# Patient Record
Sex: Female | Born: 1945 | Race: White | Hispanic: No | Smoking: Former smoker
Health system: Southern US, Community
[De-identification: ages and names within clinical notes are randomized; demographics above are authoritative.]

## PROBLEM LIST (undated history)

## (undated) DIAGNOSIS — I10 Essential (primary) hypertension: Secondary | ICD-10-CM

## (undated) DIAGNOSIS — U071 COVID-19: Secondary | ICD-10-CM

## (undated) DIAGNOSIS — Z86718 Personal history of other venous thrombosis and embolism: Secondary | ICD-10-CM

## (undated) DIAGNOSIS — I509 Heart failure, unspecified: Secondary | ICD-10-CM

## (undated) DIAGNOSIS — R011 Cardiac murmur, unspecified: Secondary | ICD-10-CM

## (undated) HISTORY — DX: Cardiac murmur, unspecified: R01.1

## (undated) HISTORY — PX: NO PAST SURGERIES: SHX2092

---

## 2001-02-27 ENCOUNTER — Ambulatory Visit (HOSPITAL_COMMUNITY): Admission: RE | Admit: 2001-02-27 | Discharge: 2001-02-27 | Payer: Self-pay | Admitting: *Deleted

## 2001-02-27 ENCOUNTER — Encounter: Payer: Self-pay | Admitting: *Deleted

## 2003-01-24 ENCOUNTER — Ambulatory Visit (HOSPITAL_COMMUNITY): Admission: RE | Admit: 2003-01-24 | Discharge: 2003-01-24 | Payer: Self-pay | Admitting: *Deleted

## 2004-07-09 ENCOUNTER — Ambulatory Visit (HOSPITAL_COMMUNITY): Admission: RE | Admit: 2004-07-09 | Discharge: 2004-07-09 | Payer: Self-pay | Admitting: Family Medicine

## 2005-08-13 ENCOUNTER — Ambulatory Visit (HOSPITAL_COMMUNITY): Admission: RE | Admit: 2005-08-13 | Discharge: 2005-08-13 | Payer: Self-pay | Admitting: Family Medicine

## 2006-08-19 ENCOUNTER — Ambulatory Visit (HOSPITAL_COMMUNITY): Admission: RE | Admit: 2006-08-19 | Discharge: 2006-08-19 | Payer: Self-pay | Admitting: *Deleted

## 2007-10-14 ENCOUNTER — Ambulatory Visit (HOSPITAL_COMMUNITY): Admission: RE | Admit: 2007-10-14 | Discharge: 2007-10-14 | Payer: Self-pay | Admitting: *Deleted

## 2008-12-06 ENCOUNTER — Ambulatory Visit (HOSPITAL_COMMUNITY): Admission: RE | Admit: 2008-12-06 | Discharge: 2008-12-06 | Payer: Self-pay | Admitting: Family Medicine

## 2008-12-28 ENCOUNTER — Emergency Department: Payer: Self-pay | Admitting: Emergency Medicine

## 2010-03-25 ENCOUNTER — Encounter: Payer: Self-pay | Admitting: Family Medicine

## 2010-11-06 ENCOUNTER — Other Ambulatory Visit (HOSPITAL_COMMUNITY): Payer: Self-pay | Admitting: Family Medicine

## 2010-11-06 DIAGNOSIS — Z139 Encounter for screening, unspecified: Secondary | ICD-10-CM

## 2010-11-12 ENCOUNTER — Ambulatory Visit (HOSPITAL_COMMUNITY)
Admission: RE | Admit: 2010-11-12 | Discharge: 2010-11-12 | Disposition: A | Discharge status: Home / Self Care | Payer: Medicare Other | Source: Ambulatory Visit | Attending: Family Medicine | Admitting: Family Medicine

## 2010-11-12 DIAGNOSIS — Z139 Encounter for screening, unspecified: Secondary | ICD-10-CM

## 2010-11-12 DIAGNOSIS — Z1231 Encounter for screening mammogram for malignant neoplasm of breast: Secondary | ICD-10-CM | POA: Insufficient documentation

## 2011-12-04 ENCOUNTER — Other Ambulatory Visit (HOSPITAL_COMMUNITY): Payer: Self-pay | Admitting: Family Medicine

## 2011-12-04 DIAGNOSIS — Z139 Encounter for screening, unspecified: Secondary | ICD-10-CM

## 2011-12-23 ENCOUNTER — Ambulatory Visit (HOSPITAL_COMMUNITY)
Admission: RE | Admit: 2011-12-23 | Discharge: 2011-12-23 | Disposition: A | Discharge status: Home / Self Care | Payer: Medicare Other | Source: Ambulatory Visit | Attending: Family Medicine | Admitting: Family Medicine

## 2011-12-23 DIAGNOSIS — Z1231 Encounter for screening mammogram for malignant neoplasm of breast: Secondary | ICD-10-CM | POA: Insufficient documentation

## 2011-12-23 DIAGNOSIS — Z139 Encounter for screening, unspecified: Secondary | ICD-10-CM

## 2012-11-27 ENCOUNTER — Other Ambulatory Visit (HOSPITAL_COMMUNITY): Payer: Self-pay | Admitting: Family Medicine

## 2012-11-27 DIAGNOSIS — Z139 Encounter for screening, unspecified: Secondary | ICD-10-CM

## 2012-12-28 ENCOUNTER — Ambulatory Visit (HOSPITAL_COMMUNITY)
Admission: RE | Admit: 2012-12-28 | Discharge: 2012-12-28 | Disposition: A | Discharge status: Home / Self Care | Payer: Medicare Other | Source: Ambulatory Visit | Attending: Family Medicine | Admitting: Family Medicine

## 2012-12-28 DIAGNOSIS — Z139 Encounter for screening, unspecified: Secondary | ICD-10-CM

## 2012-12-28 DIAGNOSIS — Z1231 Encounter for screening mammogram for malignant neoplasm of breast: Secondary | ICD-10-CM | POA: Insufficient documentation

## 2013-12-03 ENCOUNTER — Other Ambulatory Visit (HOSPITAL_COMMUNITY): Payer: Self-pay | Admitting: Family Medicine

## 2013-12-03 DIAGNOSIS — Z1231 Encounter for screening mammogram for malignant neoplasm of breast: Secondary | ICD-10-CM

## 2013-12-29 ENCOUNTER — Ambulatory Visit (HOSPITAL_COMMUNITY)
Admission: RE | Admit: 2013-12-29 | Discharge: 2013-12-29 | Disposition: A | Discharge status: Home / Self Care | Payer: Medicare Other | Source: Ambulatory Visit | Attending: Family Medicine | Admitting: Family Medicine

## 2013-12-29 DIAGNOSIS — Z1231 Encounter for screening mammogram for malignant neoplasm of breast: Secondary | ICD-10-CM | POA: Insufficient documentation

## 2014-12-08 ENCOUNTER — Other Ambulatory Visit (HOSPITAL_COMMUNITY): Payer: Self-pay | Admitting: Family Medicine

## 2014-12-08 DIAGNOSIS — Z1231 Encounter for screening mammogram for malignant neoplasm of breast: Secondary | ICD-10-CM

## 2015-01-02 ENCOUNTER — Ambulatory Visit (HOSPITAL_COMMUNITY)
Admission: RE | Admit: 2015-01-02 | Discharge: 2015-01-02 | Disposition: A | Discharge status: Home / Self Care | Payer: Medicare Other | Source: Ambulatory Visit | Attending: Family Medicine | Admitting: Family Medicine

## 2015-01-02 DIAGNOSIS — Z1231 Encounter for screening mammogram for malignant neoplasm of breast: Secondary | ICD-10-CM | POA: Diagnosis present

## 2015-09-25 ENCOUNTER — Encounter: Payer: Self-pay | Admitting: *Deleted

## 2015-09-26 ENCOUNTER — Ambulatory Visit: Payer: Medicare HMO | Admitting: Anesthesiology

## 2015-09-26 ENCOUNTER — Encounter: Payer: Self-pay | Admitting: *Deleted

## 2015-09-26 ENCOUNTER — Encounter
Admission: RE | Disposition: A | Discharge status: Home / Self Care | Payer: Self-pay | Source: Ambulatory Visit | Attending: Gastroenterology

## 2015-09-26 ENCOUNTER — Ambulatory Visit
Admission: RE | Admit: 2015-09-26 | Discharge: 2015-09-26 | Disposition: A | Discharge status: Home / Self Care | Payer: Medicare HMO | Source: Ambulatory Visit | Attending: Gastroenterology | Admitting: Gastroenterology

## 2015-09-26 DIAGNOSIS — D127 Benign neoplasm of rectosigmoid junction: Secondary | ICD-10-CM | POA: Insufficient documentation

## 2015-09-26 DIAGNOSIS — Z882 Allergy status to sulfonamides status: Secondary | ICD-10-CM | POA: Diagnosis not present

## 2015-09-26 DIAGNOSIS — Z79899 Other long term (current) drug therapy: Secondary | ICD-10-CM | POA: Diagnosis not present

## 2015-09-26 DIAGNOSIS — I10 Essential (primary) hypertension: Secondary | ICD-10-CM | POA: Diagnosis not present

## 2015-09-26 DIAGNOSIS — Z881 Allergy status to other antibiotic agents status: Secondary | ICD-10-CM | POA: Insufficient documentation

## 2015-09-26 DIAGNOSIS — Z7982 Long term (current) use of aspirin: Secondary | ICD-10-CM | POA: Diagnosis not present

## 2015-09-26 DIAGNOSIS — Z800 Family history of malignant neoplasm of digestive organs: Secondary | ICD-10-CM | POA: Insufficient documentation

## 2015-09-26 DIAGNOSIS — R195 Other fecal abnormalities: Secondary | ICD-10-CM | POA: Insufficient documentation

## 2015-09-26 HISTORY — DX: Essential (primary) hypertension: I10

## 2015-09-26 HISTORY — PX: COLONOSCOPY WITH PROPOFOL: SHX5780

## 2015-09-26 SURGERY — COLONOSCOPY WITH PROPOFOL
Anesthesia: General

## 2015-09-26 MED ORDER — SODIUM CHLORIDE 0.9 % IV SOLN
INTRAVENOUS | Status: DC
  Administered 2015-09-26: 12:00:00 via INTRAVENOUS

## 2015-09-26 MED ORDER — PROPOFOL 500 MG/50ML IV EMUL
INTRAVENOUS | Status: DC | PRN
Start: 2015-09-26 — End: 2015-09-26
  Administered 2015-09-26: 120 ug/kg/min via INTRAVENOUS

## 2015-09-26 MED ORDER — MIDAZOLAM HCL 2 MG/2ML IJ SOLN
INTRAMUSCULAR | Status: DC | PRN
  Administered 2015-09-26: 1 mg via INTRAVENOUS

## 2015-09-26 MED ORDER — FENTANYL CITRATE (PF) 100 MCG/2ML IJ SOLN
INTRAMUSCULAR | Status: DC | PRN
  Administered 2015-09-26 (×2): 50 ug via INTRAVENOUS

## 2015-09-26 MED ORDER — EPHEDRINE SULFATE 50 MG/ML IJ SOLN
INTRAMUSCULAR | Status: DC | PRN
  Administered 2015-09-26: 10 mg via INTRAVENOUS

## 2015-09-26 MED ORDER — SODIUM CHLORIDE 0.9 % IV SOLN: INTRAVENOUS | Status: DC

## 2015-09-26 NOTE — Anesthesia Preprocedure Evaluation (Signed)
Anesthesia Evaluation  Patient identified by MRN, date of birth, ID band Patient awake    Reviewed: Allergy & Precautions, H&P , NPO status , Patient's Chart, lab work & pertinent test results  History of Anesthesia Complications Negative for: history of anesthetic complications  Airway Mallampati: III  TM Distance: >3 FB Neck ROM: full    Dental  (+) Poor Dentition   Pulmonary neg shortness of breath, former smoker,    Pulmonary exam normal breath sounds clear to auscultation       Cardiovascular Exercise Tolerance: Good hypertension, Normal cardiovascular exam Rhythm:regular Rate:Normal     Neuro/Psych negative neurological ROS  negative psych ROS   GI/Hepatic negative GI ROS, Neg liver ROS,   Endo/Other  negative endocrine ROS  Renal/GU negative Renal ROS  negative genitourinary   Musculoskeletal   Abdominal   Peds  Hematology negative hematology ROS (+)   Anesthesia Other Findings Past Medical History: No date: Hypertension  Past Surgical History: No date: NO PAST SURGERIES  BMI    Body Mass Index:  30.73 kg/m      Reproductive/Obstetrics negative OB ROS                             Anesthesia Physical Anesthesia Plan  ASA: III  Anesthesia Plan: General   Post-op Pain Management:    Induction:   Airway Management Planned:   Additional Equipment:   Intra-op Plan:   Post-operative Plan:   Informed Consent: I have reviewed the patients History and Physical, chart, labs and discussed the procedure including the risks, benefits and alternatives for the proposed anesthesia with the patient or authorized representative who has indicated his/her understanding and acceptance.   Dental Advisory Given  Plan Discussed with: Anesthesiologist, CRNA and Surgeon  Anesthesia Plan Comments:         Anesthesia Quick Evaluation

## 2015-09-26 NOTE — Anesthesia Postprocedure Evaluation (Signed)
Anesthesia Post Note  Patient: Paula Klein  Procedure(s) Performed: Procedure(s) (LRB): COLONOSCOPY WITH PROPOFOL (N/A)  Patient location during evaluation: Endoscopy Anesthesia Type: General Level of consciousness: awake and alert Pain management: pain level controlled Vital Signs Assessment: post-procedure vital signs reviewed and stable Respiratory status: spontaneous breathing, nonlabored ventilation, respiratory function stable and patient connected to nasal cannula oxygen Cardiovascular status: blood pressure returned to baseline and stable Postop Assessment: no signs of nausea or vomiting Anesthetic complications: no    Last Vitals:  Vitals:   09/26/15 1323 09/26/15 1333  BP: 105/60 107/65  Pulse: (!) 58 (!) 57  Resp: 10 11  Temp:      Last Pain:  Vitals:   09/26/15 1253  TempSrc: Tympanic  PainSc: Asleep                 Precious Haws Brentley Horrell

## 2015-09-26 NOTE — H&P (Signed)
Outpatient short stay form Pre-procedure 09/26/2015 12:05 PM Paula Sails MD  Primary Physician: Dr. Delight Stare  Reason for visit:  Colonoscopy  History of present illness:  Patient is a 70 year old female presenting today as above. She was found to be Hemoccult-positive on screening her primary physician. It is been many years since her last colonoscopy. There is a family history of colon cancer and a primary relative, brother. She tolerated her prep well. She does take 81 mg aspirin the last time yesterday. She takes no other aspirin products or blood thinning agents.    Current Facility-Administered Medications:  .  0.9 %  sodium chloride infusion, , Intravenous, Continuous, Paula Sails, MD, Last Rate: 20 mL/hr at 09/26/15 1205 .  0.9 %  sodium chloride infusion, , Intravenous, Continuous, Paula Sails, MD  Prescriptions Prior to Admission  Medication Sig Dispense Refill Last Dose  . albuterol (PROVENTIL HFA) 108 (90 Base) MCG/ACT inhaler Inhale 2 puffs into the lungs every 6 (six) hours as needed for wheezing or shortness of breath.   09/25/2015 at Unknown time  . aspirin EC 81 MG tablet Take 81 mg by mouth daily.   09/25/2015 at 2100  . atenolol (TENORMIN) 25 MG tablet Take 25 mg by mouth daily.   09/26/2015 at 0600  . calcium citrate-vitamin D (CITRACAL+D) 315-200 MG-UNIT tablet Take 1 tablet by mouth 2 (two) times daily.     . Chlorphen-Pseudoephed-APAP (CORICIDIN D PO) Take by mouth.     . diphenhydramine-acetaminophen (TYLENOL PM) 25-500 MG TABS tablet Take 1 tablet by mouth at bedtime as needed.   09/25/2015 at Unknown time  . estradiol (ESTRACE) 0.5 MG tablet Take 0.5 mg by mouth daily.   09/26/2015 at 0600  . glucosamine-chondroitin 500-400 MG tablet Take 1 tablet by mouth 3 (three) times daily.   Past Week at Unknown time  . lisinopril-hydrochlorothiazide (PRINZIDE,ZESTORETIC) 20-12.5 MG tablet Take 1 tablet by mouth daily.   09/26/2015 at 0600  . loratadine  (CLARITIN) 10 MG tablet Take 10 mg by mouth daily.   Past Week at Unknown time  . medroxyPROGESTERone (PROVERA) 2.5 MG tablet Take 2.5 mg by mouth daily.   09/26/2015 at 0600  . Omega-3 Fatty Acids (FISH OIL PO) Take by mouth.   Past Week at Unknown time  . vitamin C (ASCORBIC ACID) 500 MG tablet Take 1,500 mg by mouth daily.   Past Week at Unknown time     Allergies  Allergen Reactions  . Sulfa Antibiotics   . Tetracyclines & Related      Past Medical History:  Diagnosis Date  . Hypertension     Review of systems:      Physical Exam    Heart and lungs: Regular rate and rhythm without rub or gallop, lungs are bilaterally clear.    HEENT: Normocephalic atraumatic eyes are anicteric    Other:     Pertinant exam for procedure: Soft nontender nondistended bowel sounds positive normoactive.    Planned proceedures: Colonoscopy and indicated procedures. I have discussed the risks benefits and complications of procedures to include not limited to bleeding, infection, perforation and the risk of sedation and the patient wishes to proceed.    Paula Sails, MD Gastroenterology 09/26/2015  12:05 PM

## 2015-09-26 NOTE — Transfer of Care (Signed)
Immediate Anesthesia Transfer of Care Note  Patient: Paula Klein  Procedure(s) Performed: Procedure(s): COLONOSCOPY WITH PROPOFOL (N/A)  Patient Location: PACU  Anesthesia Type:General  Level of Consciousness: awake and sedated  Airway & Oxygen Therapy: Patient Spontanous Breathing and Patient connected to nasal cannula oxygen  Post-op Assessment: Report given to RN and Post -op Vital signs reviewed and stable  Post vital signs: Reviewed and stable  Last Vitals:  Vitals:   09/26/15 1150 09/26/15 1253  BP: 134/72 (!) 90/51  Pulse: (!) 58   Resp: 18   Temp: 36.5 C 36.4 C    Last Pain:  Vitals:   09/26/15 1253  TempSrc: Tympanic  PainSc: Asleep         Complications: No apparent anesthesia complications

## 2015-09-26 NOTE — Op Note (Signed)
[facility] Gastroenterology Patient Name: Paula Klein Procedure Date: 09/26/2015 12:10 PM MRN: [id number] Account #: [id number] Date of Birth: 07-13-1945 Admit Type: Outpatient Age: 70 Room: [facility] ENDO ROOM 3 Gender: Female Note Status: Finalized Procedure:            Colonoscopy Indications:          Heme positive stool Providers:            Lollie Sails, MD Referring MD:         Marguerita Merles, MD (Referring MD) Medicines:            Monitored Anesthesia Care Complications:        No immediate complications. Procedure:            Pre-Anesthesia Assessment:                       - ASA Grade Assessment: II - A patient with mild                        systemic disease.                       After obtaining informed consent, the colonoscope was                        passed under direct vision. Throughout the procedure,                        the patient's blood pressure, pulse, and oxygen                        saturations were monitored continuously. The                        Colonoscope was introduced through the anus and                        advanced to the the cecum, identified by appendiceal                        orifice and ileocecal valve. The colonoscopy was                        performed without difficulty. The patient tolerated the                        procedure well. The quality of the bowel preparation                        was good. Findings:      A 10 mm polyp was found at 18 cm proximal to the anus. The polyp was       pedunculated. The polyp was removed with a hot snare. Resection and       retrieval were complete. To prevent bleeding after the polypectomy, one       hemostatic clip was successfully placed. There was no bleeding during       the maneuver.      The exam was otherwise normal throughout the examined colon.      The retroflexed view of the distal rectum and anal verge was normal and  showed no anal or rectal  abnormalities. Impression:           - One 10 mm polyp at 18 cm proximal to the anus,                        removed with a hot snare. Resected and retrieved. Clip                        was placed.                       - The distal rectum and anal verge are normal on                        retroflexion view. Recommendation:       - Await pathology results.                       - Discharge patient to home.                       - Full liquid diet for 2 days, then advance as                        tolerated to low residue diet for 3 days. Procedure Code(s):    --- Professional ---                       [phone], Colonoscopy, flexible; with removal of tumor(s),                        polyp(s), or other lesion(s) by snare technique Diagnosis Code(s):    --- Professional ---                       D12.6, Benign neoplasm of colon, unspecified                       R19.5, Other fecal abnormalities CPT copyright 2016 American Medical Association. All rights reserved. The codes documented in this report are preliminary and upon coder review may  be revised to meet current compliance requirements. Lollie Sails, MD 09/26/2015 12:49:43 PM This report has been signed electronically. Number of Addenda: 0 Note Initiated On: 09/26/2015 12:10 PM Scope Withdrawal Time: 0 hours 11 minutes 46 seconds  Total Procedure Duration: 0 hours 30 minutes 28 seconds       [facility]

## 2015-09-26 NOTE — Anesthesia Procedure Notes (Signed)
Performed by: COOK-MARTIN, Shyloh Krinke Pre-anesthesia Checklist: Patient identified, Emergency Drugs available, Suction available, Patient being monitored and Timeout performed Patient Re-evaluated:Patient Re-evaluated prior to inductionOxygen Delivery Method: Nasal cannula Preoxygenation: Pre-oxygenation with 100% oxygen Intubation Type: IV induction Placement Confirmation: positive ETCO2 and CO2 detector       

## 2015-09-27 ENCOUNTER — Encounter: Payer: Self-pay | Admitting: Gastroenterology

## 2015-09-27 LAB — SURGICAL PATHOLOGY

## 2016-01-02 ENCOUNTER — Other Ambulatory Visit (HOSPITAL_COMMUNITY): Payer: Self-pay | Admitting: Family Medicine

## 2016-01-02 DIAGNOSIS — Z1231 Encounter for screening mammogram for malignant neoplasm of breast: Secondary | ICD-10-CM

## 2016-01-29 ENCOUNTER — Ambulatory Visit (HOSPITAL_COMMUNITY): Payer: Medicare Other

## 2016-02-05 ENCOUNTER — Ambulatory Visit (HOSPITAL_COMMUNITY)
Admission: RE | Admit: 2016-02-05 | Discharge: 2016-02-05 | Disposition: A | Discharge status: Home / Self Care | Payer: Medicare HMO | Source: Ambulatory Visit | Attending: Family Medicine | Admitting: Family Medicine

## 2016-02-05 DIAGNOSIS — Z1231 Encounter for screening mammogram for malignant neoplasm of breast: Secondary | ICD-10-CM | POA: Diagnosis present

## 2016-03-11 ENCOUNTER — Emergency Department: Payer: Medicare HMO

## 2016-03-11 ENCOUNTER — Encounter: Payer: Self-pay | Admitting: Emergency Medicine

## 2016-03-11 ENCOUNTER — Emergency Department
Admission: EM | Admit: 2016-03-11 | Discharge: 2016-03-11 | Disposition: A | Discharge status: Home / Self Care | Payer: Medicare HMO | Attending: Emergency Medicine | Admitting: Emergency Medicine

## 2016-03-11 DIAGNOSIS — I80291 Phlebitis and thrombophlebitis of other deep vessels of right lower extremity: Secondary | ICD-10-CM | POA: Diagnosis not present

## 2016-03-11 DIAGNOSIS — Z87891 Personal history of nicotine dependence: Secondary | ICD-10-CM | POA: Insufficient documentation

## 2016-03-11 DIAGNOSIS — I10 Essential (primary) hypertension: Secondary | ICD-10-CM | POA: Diagnosis not present

## 2016-03-11 DIAGNOSIS — M7989 Other specified soft tissue disorders: Secondary | ICD-10-CM | POA: Diagnosis present

## 2016-03-11 LAB — COMPREHENSIVE METABOLIC PANEL
ALT: 18 U/L (ref 14–54)
AST: 24 U/L (ref 15–41)
Albumin: 4.1 g/dL (ref 3.5–5.0)
Alkaline Phosphatase: 55 U/L (ref 38–126)
Anion gap: 6 (ref 5–15)
BUN: 13 mg/dL (ref 6–20)
CHLORIDE: 102 mmol/L (ref 101–111)
CO2: 28 mmol/L (ref 22–32)
CREATININE: 0.64 mg/dL (ref 0.44–1.00)
Calcium: 9.5 mg/dL (ref 8.9–10.3)
GFR calc non Af Amer: >60 mL/min (ref >60)
Glucose, Bld: 108 mg/dL — ABNORMAL HIGH (ref 65–99)
POTASSIUM: 3.4 mmol/L — AB (ref 3.5–5.1)
Sodium: 136 mmol/L (ref 135–145)
Total Bilirubin: 0.5 mg/dL (ref 0.3–1.2)
Total Protein: 7.5 g/dL (ref 6.5–8.1)

## 2016-03-11 LAB — CBC WITH DIFFERENTIAL/PLATELET
Basophils Absolute: 0.1 10*3/uL (ref 0–0.1)
Basophils Relative: 1 %
EOS PCT: 2 %
Eosinophils Absolute: 0.2 10*3/uL (ref 0–0.7)
HCT: 37.5 % (ref 35.0–47.0)
Hemoglobin: 12.6 g/dL (ref 12.0–16.0)
LYMPHS ABS: 2.5 10*3/uL (ref 1.0–3.6)
LYMPHS PCT: 25 %
MCH: 25.5 pg — AB (ref 26.0–34.0)
MCHC: 33.6 g/dL (ref 32.0–36.0)
MCV: 75.7 fL — AB (ref 80.0–100.0)
MONO ABS: 0.7 10*3/uL (ref 0.2–0.9)
Monocytes Relative: 7 %
Neutro Abs: 6.5 10*3/uL (ref 1.4–6.5)
Neutrophils Relative %: 65 %
PLATELETS: 358 10*3/uL (ref 150–440)
RBC: 4.95 MIL/uL (ref 3.80–5.20)
RDW: 14 % (ref 11.5–14.5)
WBC: 9.9 10*3/uL (ref 3.6–11.0)

## 2016-03-11 MED ORDER — RIVAROXABAN 15 MG PO TABS
15.0000 mg | ORAL_TABLET | Freq: Once | ORAL | Status: AC
  Administered 2016-03-11: 15 mg via ORAL
  Filled 2016-03-11: qty 1

## 2016-03-11 MED ORDER — HYDROCODONE-ACETAMINOPHEN 5-325 MG PO TABS
1.0000 | ORAL_TABLET | Freq: Once | ORAL | Status: AC
  Administered 2016-03-11: 1 via ORAL
  Filled 2016-03-11: qty 1

## 2016-03-11 MED ORDER — HYDROCODONE-ACETAMINOPHEN 5-325 MG PO TABS: ORAL_TABLET | ORAL | 0 refills | Status: DC

## 2016-03-11 MED ORDER — RIVAROXABAN (XARELTO) VTE STARTER PACK (15 & 20 MG): ORAL_TABLET | ORAL | 0 refills | Status: DC

## 2016-03-11 NOTE — ED Provider Notes (Signed)
[facility] Emergency Department Provider Note  ____________________________________________   First MD Initiated Contact with Patient 03/11/16 1142     (approximate)  I have reviewed the triage vital signs and the nursing notes.   HISTORY  Chief Complaint Leg Swelling    HPI Paula Klein is a 71 y.o. female is here complaining of right leg pain since yesterday. Patient noticed that there was some swelling and apply a heating pad to it yesterday without any improvement. She denies any injury or long distance traveling. She denies any previous injury to her leg. Patient denies any history of DVT, shortness of breath or chest pain. This morning she is unable to bear weight without increased pain. Currently she rates her pain as 9/10.   Past Medical History:  Diagnosis Date  . Hypertension     There are no active problems to display for this patient.   Past Surgical History:  Procedure Laterality Date  . COLONOSCOPY WITH PROPOFOL N/A 09/26/2015   Procedure: COLONOSCOPY WITH PROPOFOL;  Surgeon: Lollie Sails, MD;  Location: [facility] ENDOSCOPY;  Service: Endoscopy;  Laterality: N/A;  . NO PAST SURGERIES      Prior to Admission medications   Medication Sig Start Date End Date Taking? Authorizing Provider  albuterol (PROVENTIL HFA) 108 (90 Base) MCG/ACT inhaler Inhale 2 puffs into the lungs every 6 (six) hours as needed for wheezing or shortness of breath.    Historical Provider, MD  aspirin EC 81 MG tablet Take 81 mg by mouth daily.    Historical Provider, MD  atenolol (TENORMIN) 25 MG tablet Take 25 mg by mouth daily.    Historical Provider, MD  calcium citrate-vitamin D (CITRACAL+D) 315-200 MG-UNIT tablet Take 1 tablet by mouth 2 (two) times daily.    Historical Provider, MD  Chlorphen-Pseudoephed-APAP (CORICIDIN D PO) Take by mouth.    Historical Provider, MD  diphenhydramine-acetaminophen (TYLENOL PM) 25-500 MG TABS tablet Take 1 tablet by mouth at  bedtime as needed.    Historical Provider, MD  estradiol (ESTRACE) 0.5 MG tablet Take 0.5 mg by mouth daily.    Historical Provider, MD  glucosamine-chondroitin 500-400 MG tablet Take 1 tablet by mouth 3 (three) times daily.    Historical Provider, MD  HYDROcodone-acetaminophen (NORCO/VICODIN) 5-325 MG tablet Take 1 tablet q 4-6 hours prn pain 03/11/16   Johnn Hai, PA-C  lisinopril-hydrochlorothiazide (PRINZIDE,ZESTORETIC) 20-12.5 MG tablet Take 1 tablet by mouth daily.    Historical Provider, MD  loratadine (CLARITIN) 10 MG tablet Take 10 mg by mouth daily.    Historical Provider, MD  medroxyPROGESTERone (PROVERA) 2.5 MG tablet Take 2.5 mg by mouth daily.    Historical Provider, MD  Omega-3 Fatty Acids (FISH OIL PO) Take by mouth.    Historical Provider, MD  Rivaroxaban 15 & 20 MG TBPK Take as directed on package: Start with one 15mg  tablet by mouth twice a day with food. On Day 22, switch to one 20mg  tablet once a day with food. 03/11/16   Johnn Hai, PA-C  vitamin C (ASCORBIC ACID) 500 MG tablet Take 1,500 mg by mouth daily.    Historical Provider, MD    Allergies Sulfa antibiotics and Tetracyclines & related  No family history on file.  Social History Social History  Substance Use Topics  . Smoking status: Former Research scientist (life sciences)  . Smokeless tobacco: Never Used  . Alcohol use No    Review of Systems Constitutional: No fever/chills Eyes: No visual changes. ENT: No sore  throat. Cardiovascular: Denies chest pain. Respiratory: Denies shortness of breath. Gastrointestinal: No abdominal pain.  No nausea, no vomiting.  Musculoskeletal: Positive right leg pain. Skin: Negative for rash. Neurological: Negative for headaches, focal weakness or numbness.  10-point ROS otherwise negative.  ____________________________________________   PHYSICAL EXAM:  VITAL SIGNS: ED Triage Vitals  Enc Vitals Group     BP 03/11/16 0906 140/75     Pulse Rate 03/11/16 0906 72     Resp 03/11/16  0906 18     Temp 03/11/16 0906 98 F (36.7 C)     Temp Source 03/11/16 0906 Oral     SpO2 03/11/16 0906 98 %     Weight 03/11/16 0856 150 lb (68 kg)     Height 03/11/16 0856 5\' 2"  (1.575 m)     Head Circumference --      Peak Flow --      Pain Score 03/11/16 0857 9     Pain Loc --      Pain Edu? --      Excl. in [place]? --     Constitutional: Alert and oriented. Well appearing and in no acute distress. Eyes: Conjunctivae are normal. PERRL. EOMI. Head: Atraumatic. Nose: No congestion/rhinnorhea. Neck: No stridor.   Cardiovascular: Normal rate, regular rhythm. Grossly normal heart sounds.  Good peripheral circulation. Respiratory: Normal respiratory effort.  No retractions. Lungs CTAB. Gastrointestinal: Soft and nontender. No distention.  Musculoskeletal: On examination of the right leg there is no appreciated edema and there is some with left. There is some warmth and tenderness on palpation posterior knee with minimal soft tissue swelling. Patient has tortuous veins. Minimal positive Homans sign. Pulse is  present distal to this area. Skin is warm and dry. Neurologic:  Normal speech and language. No gross focal neurologic deficits are appreciated.  Skin:  Skin is warm, dry and intact. No rash noted. Psychiatric: Mood and affect are normal. Speech and behavior are normal.  ____________________________________________   LABS (all labs ordered are listed, but only abnormal results are displayed)  Labs Reviewed  CBC WITH DIFFERENTIAL/PLATELET - Abnormal; Notable for the following:       Result Value   MCV 75.7 (*)    MCH 25.5 (*)    All other components within normal limits  COMPREHENSIVE METABOLIC PANEL - Abnormal; Notable for the following:    Potassium 3.4 (*)    Glucose, Bld 108 (*)    All other components within normal limits    RADIOLOGY  [place] right lower extremity per radiologist: IMPRESSION:  Evidence of superficial thrombophlebitis involving varicosities of  the  proximal calf. These varicosities likely communicate with the  great saphenous vein. No evidence of deep vein thrombosis.    ____________________________________________   PROCEDURES  Procedure(s) performed: None  Procedures  Critical Care performed: No  ____________________________________________   INITIAL IMPRESSION / ASSESSMENT AND PLAN / ED COURSE  Pertinent labs & imaging results that were available during my care of the patient were reviewed by me and considered in my medical decision making (see chart for details).    Clinical Course    Patient was given Norco while in the emergency room which gave her some relief. Patient was informed that she does have thrombophlebitis of her right lower extremity. Patient was started on Rivaroxaban 15 mg twice a day and to switch on day 22 switch to 20 mg tablet once a day with food. Patient is to contact her doctor is [facility] for a follow-up appointment. Patient  is return to the emergency room if any worsening of her symptoms, shortness of breath or chest pain. ____________________________________________   FINAL CLINICAL IMPRESSION(S) / ED DIAGNOSES  Final diagnoses:  Thrombophlebitis of right lower extremity ([place])      NEW MEDICATIONS STARTED DURING THIS VISIT:  Discharge Medication List as of 03/11/2016  1:22 PM    START taking these medications   Details  HYDROcodone-acetaminophen (NORCO/VICODIN) 5-325 MG tablet Take 1 tablet q 4-6 hours prn pain, Print    Rivaroxaban 15 & 20 MG TBPK Take as directed on package: Start with one 15mg  tablet by mouth twice a day with food. On Day 22, switch to one 20mg  tablet once a day with food., Print         Note:  This document was prepared using Dragon voice recognition software and may include unintentional dictation errors.    Johnn Hai, PA-C 03/11/16 [place], MD 03/16/16 [phone]

## 2016-03-11 NOTE — Discharge Instructions (Signed)
Call and make an appointment with your primary care doctor. Take Norco as directed 1 every 4-6 hours as needed for pain. Be aware that this medication could cause drowsiness and increase your risk for falling.   Begin taking Xarelto as directed. You had your first dose in the emergency room so your next dose will be tonight. Return to the emergency room if any worsening of your symptoms such as increased leg pain, coldness of your leg, or difficulty breathing.

## 2016-03-11 NOTE — ED Triage Notes (Signed)
Pt reports right leg swelling since yesterday, reports redness and pain. Reports she applied heating pad yesterday with no improvement.

## 2016-04-09 ENCOUNTER — Other Ambulatory Visit: Payer: Self-pay | Admitting: Family Medicine

## 2016-04-09 DIAGNOSIS — I803 Phlebitis and thrombophlebitis of lower extremities, unspecified: Secondary | ICD-10-CM

## 2016-04-19 ENCOUNTER — Ambulatory Visit
Admission: RE | Admit: 2016-04-19 | Discharge: 2016-04-19 | Disposition: A | Discharge status: Home / Self Care | Payer: Medicare HMO | Source: Ambulatory Visit | Attending: Family Medicine | Admitting: Family Medicine

## 2016-04-19 DIAGNOSIS — I803 Phlebitis and thrombophlebitis of lower extremities, unspecified: Secondary | ICD-10-CM | POA: Diagnosis present

## 2016-04-19 DIAGNOSIS — I82811 Embolism and thrombosis of superficial veins of right lower extremities: Secondary | ICD-10-CM | POA: Insufficient documentation

## 2016-05-10 ENCOUNTER — Emergency Department
Admission: EM | Admit: 2016-05-10 | Discharge: 2016-05-10 | Disposition: A | Discharge status: Home / Self Care | Payer: Medicare HMO | Attending: Emergency Medicine | Admitting: Emergency Medicine

## 2016-05-10 ENCOUNTER — Emergency Department: Payer: Medicare HMO

## 2016-05-10 ENCOUNTER — Encounter: Payer: Self-pay | Admitting: Emergency Medicine

## 2016-05-10 DIAGNOSIS — Z87891 Personal history of nicotine dependence: Secondary | ICD-10-CM | POA: Diagnosis not present

## 2016-05-10 DIAGNOSIS — Z7982 Long term (current) use of aspirin: Secondary | ICD-10-CM | POA: Diagnosis not present

## 2016-05-10 DIAGNOSIS — I8001 Phlebitis and thrombophlebitis of superficial vessels of right lower extremity: Secondary | ICD-10-CM | POA: Insufficient documentation

## 2016-05-10 DIAGNOSIS — I809 Phlebitis and thrombophlebitis of unspecified site: Secondary | ICD-10-CM

## 2016-05-10 DIAGNOSIS — M79661 Pain in right lower leg: Secondary | ICD-10-CM | POA: Diagnosis present

## 2016-05-10 DIAGNOSIS — Z79899 Other long term (current) drug therapy: Secondary | ICD-10-CM | POA: Insufficient documentation

## 2016-05-10 DIAGNOSIS — I10 Essential (primary) hypertension: Secondary | ICD-10-CM | POA: Diagnosis not present

## 2016-05-10 DIAGNOSIS — M79604 Pain in right leg: Secondary | ICD-10-CM

## 2016-05-10 HISTORY — DX: Personal history of other venous thrombosis and embolism: Z86.718

## 2016-05-10 MED ORDER — IBUPROFEN 800 MG PO TABS
800.0000 mg | ORAL_TABLET | Freq: Once | ORAL | Status: AC
  Administered 2016-05-10: 800 mg via ORAL
  Filled 2016-05-10: qty 1

## 2016-05-10 MED ORDER — IBUPROFEN 600 MG PO TABS: 600.0000 mg | ORAL_TABLET | Freq: Three times a day (TID) | ORAL | 0 refills | Status: DC | PRN

## 2016-05-10 NOTE — ED Notes (Signed)
Patient transported to Ultrasound 

## 2016-05-10 NOTE — ED Notes (Signed)
Pt verbalized understanding of discharge instructions. NAD at this time. 

## 2016-05-10 NOTE — ED Provider Notes (Signed)
Care signed over from Dr. Owens Shark pending ultrasound. Ultrasound shows superficial thrombophlebitis and not a deep vein thrombus. The patient understands to stop taking her xarelto and to take ibuprofen around the clock along with heating pads. She is medically stable for outpatient management.   Darel Hong, MD 05/10/16 1245

## 2016-05-10 NOTE — Discharge Instructions (Signed)
Please follow-up with your primary care physician next week for recheck. Return to the emergency department sooner for any new or worsening symptoms such as chest pain, shortness of breath, or for any other concerns.  Please use prescription strength ibuprofen 3 times a day and a hot pad to help your discomfort.  Results for orders placed or performed during the hospital encounter of 03/11/16  CBC with Differential  Result Value Ref Range   WBC 9.9 3.6 - 11.0 K/uL   RBC 4.95 3.80 - 5.20 MIL/uL   Hemoglobin 12.6 12.0 - 16.0 g/dL   HCT 37.5 35.0 - 47.0 %   MCV 75.7 (L) 80.0 - 100.0 fL   MCH 25.5 (L) 26.0 - 34.0 pg   MCHC 33.6 32.0 - 36.0 g/dL   RDW 14.0 11.5 - 14.5 %   Platelets 358 150 - 440 K/uL   Neutrophils Relative % 65 %   Neutro Abs 6.5 1.4 - 6.5 K/uL   Lymphocytes Relative 25 %   Lymphs Abs 2.5 1.0 - 3.6 K/uL   Monocytes Relative 7 %   Monocytes Absolute 0.7 0.2 - 0.9 K/uL   Eosinophils Relative 2 %   Eosinophils Absolute 0.2 0 - 0.7 K/uL   Basophils Relative 1 %   Basophils Absolute 0.1 0 - 0.1 K/uL  Comprehensive metabolic panel  Result Value Ref Range   Sodium 136 135 - 145 mmol/L   Potassium 3.4 (L) 3.5 - 5.1 mmol/L   Chloride 102 101 - 111 mmol/L   CO2 28 22 - 32 mmol/L   Glucose, Bld 108 (H) 65 - 99 mg/dL   BUN 13 6 - 20 mg/dL   Creatinine, Ser 0.64 0.44 - 1.00 mg/dL   Calcium 9.5 8.9 - 10.3 mg/dL   Total Protein 7.5 6.5 - 8.1 g/dL   Albumin 4.1 3.5 - 5.0 g/dL   AST 24 15 - 41 U/L   ALT 18 14 - 54 U/L   Alkaline Phosphatase 55 38 - 126 U/L   Total Bilirubin 0.5 0.3 - 1.2 mg/dL   GFR calc non Af Amer >60 >60 mL/min   GFR calc Af Amer >60 >60 mL/min   Anion gap 6 5 - 15   US Venous Img Lower Unilateral Right  Result Date: 05/10/2016 CLINICAL DATA:  Right lower extremity pain, history of thrombophlebitis EXAM: RIGHT LOWER EXTREMITY VENOUS DOPPLER ULTRASOUND TECHNIQUE: Gray-scale sonography with graded compression, as well as color Doppler and duplex ultrasound  were performed to evaluate the lower extremity deep venous systems from the level of the common femoral vein and including the common femoral, femoral, profunda femoral, popliteal and calf veins including the posterior tibial, peroneal and gastrocnemius veins when visible. The superficial great saphenous vein was also interrogated. Spectral Doppler was utilized to evaluate flow at rest and with distal augmentation maneuvers in the common femoral, femoral and popliteal veins. COMPARISON:  04/19/2016 FINDINGS: Contralateral Common Femoral Vein: Respiratory phasicity is normal and symmetric with the symptomatic side. No evidence of thrombus. Normal compressibility. Common Femoral Vein: No evidence of thrombus. Normal compressibility, respiratory phasicity and response to augmentation. Saphenofemoral Junction: No evidence of thrombus. Normal compressibility and flow on color Doppler imaging. Profunda Femoral Vein: No evidence of thrombus. Normal compressibility and flow on color Doppler imaging. Femoral Vein: No evidence of thrombus. Normal compressibility, respiratory phasicity and response to augmentation. Popliteal Vein: No evidence of thrombus. Normal compressibility, respiratory phasicity and response to augmentation. Calf Veins: No evidence of thrombus. Normal compressibility and flow  on color Doppler imaging. Superficial Great Saphenous Vein: No evidence of thrombus. Normal compressibility and flow on color Doppler imaging. Venous Reflux:  None. 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 IMPRESSION: No evidence of deep venous thrombosis. Superficial thrombophlebitis within varicose veins along the right medial knee/proximal calf. Electronically Signed   By: Julian Hy M.D.   On: 05/10/2016 12:13   US Venous Img Lower Unilateral Right  Result Date: 04/19/2016 CLINICAL DATA:  History of superficial thrombophlebitis, check for resolution EXAM:  Right LOWER EXTREMITY VENOUS DOPPLER ULTRASOUND TECHNIQUE: Gray-scale sonography with graded compression, as well as color Doppler and duplex ultrasound were performed to evaluate the lower extremity deep venous systems from the level of the common femoral vein and including the common femoral, femoral, profunda femoral, popliteal and calf veins including the posterior tibial, peroneal and gastrocnemius veins when visible. The superficial great saphenous vein was also interrogated. Spectral Doppler was utilized to evaluate flow at rest and with distal augmentation maneuvers in the common femoral, femoral and popliteal veins. COMPARISON:  None. FINDINGS: Contralateral Common Femoral Vein: Respiratory phasicity is normal and symmetric with the symptomatic side. No evidence of thrombus. Normal compressibility. Common Femoral Vein: No evidence of thrombus. Normal compressibility, respiratory phasicity and response to augmentation. Saphenofemoral Junction: No evidence of thrombus. Normal compressibility and flow on color Doppler imaging. Profunda Femoral Vein: No evidence of thrombus. Normal compressibility and flow on color Doppler imaging. Femoral Vein: No evidence of thrombus. Normal compressibility, respiratory phasicity and response to augmentation. Popliteal Vein: No evidence of thrombus. Normal compressibility, respiratory phasicity and response to augmentation. Calf Veins: No evidence of thrombus. Normal compressibility and flow on color Doppler imaging. Superficial Great Saphenous Vein: No evidence of thrombus. Normal compressibility and flow on color Doppler imaging. Venous Reflux:  None. Other Findings: There again noted metal pole varicosities noted in the region of the proximal calf and medial knee. Many of these demonstrate thrombus within although some persistent flow is noted. The degree of thrombus has regressed somewhat in the interval consistent with some clot retraction. IMPRESSION: No deep venous  thrombosis is noted. Changes of superficial thrombosis are again seen although somewhat improved as described. Electronically Signed   By: Inez Catalina M.D.   On: 04/19/2016 14:34

## 2016-05-10 NOTE — ED Notes (Signed)
MD Brown at bedside.

## 2016-05-10 NOTE — ED Provider Notes (Signed)
[facility] Emergency Department Provider Note   First MD Initiated Contact with Patient 05/10/16 [phone]     (approximate)  I have reviewed the triage vital signs and the nursing notes.   HISTORY  Chief Complaint Leg Pain    HPI Paula Klein is a 71 y.o. female history of proximal right calf thrombophlebitis in the area of her varicosities with ultrasound performed in January and February 10 to the emergency department today with nontraumatic proximal right calf pain. Patient denies any chest pain.   Past Medical History:  Diagnosis Date  . H/O blood clots   . Hypertension     There are no active problems to display for this patient.   Past Surgical History:  Procedure Laterality Date  . COLONOSCOPY WITH PROPOFOL N/A 09/26/2015   Procedure: COLONOSCOPY WITH PROPOFOL;  Surgeon: Lollie Sails, MD;  Location: [facility] ENDOSCOPY;  Service: Endoscopy;  Laterality: N/A;  . NO PAST SURGERIES      Prior to Admission medications   Medication Sig Start Date End Date Taking? Authorizing Provider  albuterol (PROVENTIL HFA) 108 (90 Base) MCG/ACT inhaler Inhale 2 puffs into the lungs every 6 (six) hours as needed for wheezing or shortness of breath.    Historical Provider, MD  aspirin EC 81 MG tablet Take 81 mg by mouth daily.    Historical Provider, MD  atenolol (TENORMIN) 25 MG tablet Take 25 mg by mouth daily.    Historical Provider, MD  calcium citrate-vitamin D (CITRACAL+D) 315-200 MG-UNIT tablet Take 1 tablet by mouth 2 (two) times daily.    Historical Provider, MD  Chlorphen-Pseudoephed-APAP (CORICIDIN D PO) Take by mouth.    Historical Provider, MD  diphenhydramine-acetaminophen (TYLENOL PM) 25-500 MG TABS tablet Take 1 tablet by mouth at bedtime as needed.    Historical Provider, MD  estradiol (ESTRACE) 0.5 MG tablet Take 0.5 mg by mouth daily.    Historical Provider, MD  glucosamine-chondroitin 500-400 MG tablet Take 1 tablet by mouth 3 (three) times  daily.    Historical Provider, MD  HYDROcodone-acetaminophen (NORCO/VICODIN) 5-325 MG tablet Take 1 tablet q 4-6 hours prn pain 03/11/16   Johnn Hai, PA-C  lisinopril-hydrochlorothiazide (PRINZIDE,ZESTORETIC) 20-12.5 MG tablet Take 1 tablet by mouth daily.    Historical Provider, MD  loratadine (CLARITIN) 10 MG tablet Take 10 mg by mouth daily.    Historical Provider, MD  medroxyPROGESTERone (PROVERA) 2.5 MG tablet Take 2.5 mg by mouth daily.    Historical Provider, MD  Omega-3 Fatty Acids (FISH OIL PO) Take by mouth.    Historical Provider, MD  Rivaroxaban 15 & 20 MG TBPK Take as directed on package: Start with one 15mg  tablet by mouth twice a day with food. On Day 22, switch to one 20mg  tablet once a day with food. 03/11/16   Johnn Hai, PA-C  vitamin C (ASCORBIC ACID) 500 MG tablet Take 1,500 mg by mouth daily.    Historical Provider, MD    Allergies Sulfa antibiotics and Tetracyclines & related  History reviewed. No pertinent family history.  Social History Social History  Substance Use Topics  . Smoking status: Former Research scientist (life sciences)  . Smokeless tobacco: Never Used  . Alcohol use No    Review of Systems Constitutional: No fever/chills Eyes: No visual changes. ENT: No sore throat. Cardiovascular: Denies chest pain. Respiratory: Denies shortness of breath. Gastrointestinal: No abdominal pain.  No nausea, no vomiting.  No diarrhea.  No constipation. Genitourinary: Negative for dysuria. Musculoskeletal: Negative for  back pain.Positive for right calf pain Skin: Negative for rash. Neurological: Negative for headaches, focal weakness or numbness.  10-point ROS otherwise negative.  ____________________________________________   PHYSICAL EXAM:  VITAL SIGNS: ED Triage Vitals [05/10/16 0913]  Enc Vitals Group     BP (!) 143/79     Pulse Rate 65     Resp 20     Temp 98.7 F (37.1 C)     Temp src      SpO2 96 %     Weight 147 lb (66.7 kg)     Height 5\' 2"  (1.575 m)      Head Circumference      Peak Flow      Pain Score 10     Pain Loc      Pain Edu?      Excl. in [place]?     Constitutional: Alert and oriented. Well appearing and in no acute distress. Eyes: Conjunctivae are normal. PERRL. EOMI. Head: Atraumatic. Mouth/Throat: Mucous membranes are moist. Oropharynx non-erythematous. Neck: No stridor.  No meningeal signs.  No cervical spine tenderness to palpation. Cardiovascular: Normal rate, regular rhythm. Good peripheral circulation. Grossly normal heart sounds. Respiratory: Normal respiratory effort.  No retractions. Lungs CTAB. Gastrointestinal: Soft and nontender. No distention.  Musculoskeletal: Pain with palpation right calf No gross deformities of extremities. Neurologic:  Normal speech and language. No gross focal neurologic deficits are appreciated.  Skin:  Skin is warm, dry and intact. No rash noted. Psychiatric: Mood and affect are normal. Speech and behavior are normal.    RADIOLOGY I, [place], personally viewed and evaluated these images (plain radiographs) as part of my medical decision making, as well as reviewing the written report by the radiologist.  No results found.   Procedures     INITIAL IMPRESSION / ASSESSMENT AND PLAN / ED COURSE  Pertinent labs & imaging results that were available during my care of the patient were reviewed by me and considered in my medical decision making (see chart for details).  71 year old female with history of thrombophlebitis presenting to the emergency department with proximal right calf pain in the area where she previously had thrombophlebitis. However concern for possible DVT as such ultrasound repeated today. Patient given ibuprofen 800 mg in the emergency department.  Patient's care transferred to Dr. Lessie Dings    ____________________________________________  FINAL CLINICAL IMPRESSION(S) / ED DIAGNOSES  Final diagnoses:  Right leg pain     MEDICATIONS GIVEN DURING  THIS VISIT:  Medications  ibuprofen (ADVIL,MOTRIN) tablet 800 mg (not administered)     NEW OUTPATIENT MEDICATIONS STARTED DURING THIS VISIT:  New Prescriptions   No medications on file    Modified Medications   No medications on file    Discontinued Medications   No medications on file     Note:  This document was prepared using Dragon voice recognition software and may include unintentional dictation errors.    Gregor Hams, MD 05/10/16 1130

## 2016-05-10 NOTE — ED Triage Notes (Signed)
Pt to ed with c/o right leg pain that started 2 days ago, reports today unable to walk on it.  Hx of blood clots.

## 2017-03-06 ENCOUNTER — Other Ambulatory Visit (HOSPITAL_COMMUNITY): Payer: Self-pay | Admitting: Family Medicine

## 2017-03-06 DIAGNOSIS — Z1231 Encounter for screening mammogram for malignant neoplasm of breast: Secondary | ICD-10-CM

## 2017-03-17 ENCOUNTER — Ambulatory Visit (HOSPITAL_COMMUNITY)
Admission: RE | Admit: 2017-03-17 | Discharge: 2017-03-17 | Disposition: A | Discharge status: Home / Self Care | Payer: Medicare HMO | Source: Ambulatory Visit | Attending: Family Medicine | Admitting: Family Medicine

## 2017-03-17 ENCOUNTER — Encounter (HOSPITAL_COMMUNITY): Payer: Self-pay

## 2017-03-17 DIAGNOSIS — Z1231 Encounter for screening mammogram for malignant neoplasm of breast: Secondary | ICD-10-CM | POA: Insufficient documentation

## 2017-07-09 ENCOUNTER — Encounter: Payer: Self-pay | Admitting: Emergency Medicine

## 2017-07-09 ENCOUNTER — Emergency Department: Payer: Medicare HMO

## 2017-07-09 ENCOUNTER — Emergency Department
Admission: EM | Admit: 2017-07-09 | Discharge: 2017-07-09 | Disposition: A | Discharge status: Home / Self Care | Payer: Medicare HMO | Attending: Emergency Medicine | Admitting: Emergency Medicine

## 2017-07-09 ENCOUNTER — Other Ambulatory Visit: Payer: Self-pay

## 2017-07-09 DIAGNOSIS — I10 Essential (primary) hypertension: Secondary | ICD-10-CM | POA: Insufficient documentation

## 2017-07-09 DIAGNOSIS — E876 Hypokalemia: Secondary | ICD-10-CM | POA: Insufficient documentation

## 2017-07-09 DIAGNOSIS — J189 Pneumonia, unspecified organism: Secondary | ICD-10-CM | POA: Diagnosis not present

## 2017-07-09 DIAGNOSIS — Z87891 Personal history of nicotine dependence: Secondary | ICD-10-CM | POA: Insufficient documentation

## 2017-07-09 DIAGNOSIS — R05 Cough: Secondary | ICD-10-CM | POA: Diagnosis not present

## 2017-07-09 DIAGNOSIS — R0602 Shortness of breath: Secondary | ICD-10-CM | POA: Diagnosis present

## 2017-07-09 DIAGNOSIS — J9801 Acute bronchospasm: Secondary | ICD-10-CM | POA: Diagnosis not present

## 2017-07-09 LAB — BASIC METABOLIC PANEL
Anion gap: 9 (ref 5–15)
BUN: 10 mg/dL (ref 6–20)
CALCIUM: 9.4 mg/dL (ref 8.9–10.3)
CHLORIDE: 98 mmol/L — AB (ref 101–111)
CO2: 29 mmol/L (ref 22–32)
CREATININE: 0.59 mg/dL (ref 0.44–1.00)
GFR calc Af Amer: >60 mL/min (ref >60)
Glucose, Bld: 108 mg/dL — ABNORMAL HIGH (ref 65–99)
Potassium: 2.9 mmol/L — ABNORMAL LOW (ref 3.5–5.1)
SODIUM: 136 mmol/L (ref 135–145)

## 2017-07-09 LAB — CBC WITH DIFFERENTIAL/PLATELET
Basophils Absolute: 0 10*3/uL (ref 0–0.1)
Basophils Relative: 0 %
EOS ABS: 0.4 10*3/uL (ref 0–0.7)
EOS PCT: 6 %
HCT: 35.2 % (ref 35.0–47.0)
Hemoglobin: 11.6 g/dL — ABNORMAL LOW (ref 12.0–16.0)
LYMPHS ABS: 0.9 10*3/uL — AB (ref 1.0–3.6)
Lymphocytes Relative: 13 %
MCH: 24.9 pg — AB (ref 26.0–34.0)
MCHC: 32.9 g/dL (ref 32.0–36.0)
MCV: 75.8 fL — ABNORMAL LOW (ref 80.0–100.0)
MONOS PCT: 12 %
Monocytes Absolute: 0.9 10*3/uL (ref 0.2–0.9)
Neutro Abs: 4.8 10*3/uL (ref 1.4–6.5)
Neutrophils Relative %: 69 %
PLATELETS: 329 10*3/uL (ref 150–440)
RBC: 4.64 MIL/uL (ref 3.80–5.20)
RDW: 14.8 % — AB (ref 11.5–14.5)
WBC: 7.1 10*3/uL (ref 3.6–11.0)

## 2017-07-09 LAB — BRAIN NATRIURETIC PEPTIDE: B NATRIURETIC PEPTIDE 5: 95 pg/mL (ref 0.0–100.0)

## 2017-07-09 LAB — TROPONIN I: Troponin I: <0.03 ng/mL (ref <0.03)

## 2017-07-09 MED ORDER — IPRATROPIUM-ALBUTEROL 0.5-2.5 (3) MG/3ML IN SOLN
3.0000 mL | Freq: Once | RESPIRATORY_TRACT | Status: AC
  Administered 2017-07-09: 3 mL via RESPIRATORY_TRACT
  Filled 2017-07-09: qty 3

## 2017-07-09 MED ORDER — PREDNISONE 10 MG (21) PO TBPK: ORAL_TABLET | Freq: Every day | ORAL | 0 refills | Status: DC

## 2017-07-09 MED ORDER — LEVOFLOXACIN IN D5W 750 MG/150ML IV SOLN
750.0000 mg | Freq: Once | INTRAVENOUS | Status: AC
Start: 2017-07-09 — End: 2017-07-09
  Administered 2017-07-09: 750 mg via INTRAVENOUS
  Filled 2017-07-09: qty 150

## 2017-07-09 MED ORDER — METHYLPREDNISOLONE SODIUM SUCC 125 MG IJ SOLR
125.0000 mg | Freq: Once | INTRAMUSCULAR | Status: AC
  Administered 2017-07-09: 125 mg via INTRAVENOUS
  Filled 2017-07-09: qty 2

## 2017-07-09 MED ORDER — POTASSIUM CHLORIDE CRYS ER 20 MEQ PO TBCR
40.0000 meq | EXTENDED_RELEASE_TABLET | Freq: Once | ORAL | Status: AC
  Administered 2017-07-09: 40 meq via ORAL
  Filled 2017-07-09: qty 2

## 2017-07-09 MED ORDER — ALBUTEROL SULFATE HFA 108 (90 BASE) MCG/ACT IN AERS: 2.0000 | INHALATION_SPRAY | Freq: Four times a day (QID) | RESPIRATORY_TRACT | 2 refills | Status: DC | PRN

## 2017-07-09 MED ORDER — LEVOFLOXACIN 750 MG PO TABS: 750.0000 mg | ORAL_TABLET | Freq: Every day | ORAL | 0 refills | Status: DC

## 2017-07-09 MED ORDER — ALBUTEROL SULFATE (2.5 MG/3ML) 0.083% IN NEBU
5.0000 mg | INHALATION_SOLUTION | Freq: Once | RESPIRATORY_TRACT | Status: AC
  Administered 2017-07-09: 5 mg via RESPIRATORY_TRACT
  Filled 2017-07-09: qty 6

## 2017-07-09 MED ORDER — HYDROCOD POLST-CPM POLST ER 10-8 MG/5ML PO SUER: 5.0000 mL | Freq: Two times a day (BID) | ORAL | 0 refills | Status: DC

## 2017-07-09 NOTE — ED Provider Notes (Signed)
[facility] Emergency Department Provider Note       Time seen: ----------------------------------------- 8:33 AM on 07/09/2017 -----------------------------------------   I have reviewed the triage vital signs and the nursing notes.  HISTORY   Chief Complaint Shortness of Breath    HPI Paula Klein is a 72 y.o. female with a history of blood clots and hypertension who presents to the ED for shortness of breath for the past 2 days.  Patient states she has been borrowing her sister's oxygen, she has never needed oxygen before.  She was brought in with audible wheezing and sounding congested.  Oximetry was 97% on arrival.  Patient states she has had a little bit of chest pain with this and she has had some cough with sputum production at home.  She states she quit smoking 30 years ago.  Past Medical History:  Diagnosis Date  . H/O blood clots   . Hypertension     There are no active problems to display for this patient.   Past Surgical History:  Procedure Laterality Date  . COLONOSCOPY WITH PROPOFOL N/A 09/26/2015   Procedure: COLONOSCOPY WITH PROPOFOL;  Surgeon: Lollie Sails, MD;  Location: [facility] ENDOSCOPY;  Service: Endoscopy;  Laterality: N/A;  . NO PAST SURGERIES      Allergies Sulfa antibiotics and Tetracyclines & related  Social History Social History   Tobacco Use  . Smoking status: Former Research scientist (life sciences)  . Smokeless tobacco: Never Used  Substance Use Topics  . Alcohol use: No  . Drug use: No   Review of Systems Constitutional: Negative for fever. Cardiovascular: Negative for chest pain. Respiratory: Positive for shortness of breath with cough Gastrointestinal: Negative for abdominal pain, vomiting and diarrhea. Musculoskeletal: Negative for back pain. Skin: Negative for rash. Neurological: Negative for headaches, focal weakness or numbness.  All systems negative/normal/unremarkable except as stated in the  HPI  ____________________________________________   PHYSICAL EXAM:  VITAL SIGNS: ED Triage Vitals  Enc Vitals Group     BP 07/09/17 0804 (!) 159/79     Pulse Rate 07/09/17 0804 65     Resp 07/09/17 0804 16     Temp 07/09/17 0804 98.7 F (37.1 C)     Temp Source 07/09/17 0804 Oral     SpO2 07/09/17 0804 97 %     Weight --      Height --      Head Circumference --      Peak Flow --      Pain Score 07/09/17 0806 6     Pain Loc --      Pain Edu? --      Excl. in [place]? --    Constitutional: Alert and oriented.  No distress Eyes: Conjunctivae are normal. Normal extraocular movements. ENT   Head: Normocephalic and atraumatic.   Nose: No congestion/rhinnorhea.   Mouth/Throat: Mucous membranes are moist.   Neck: No stridor. Cardiovascular: Normal rate, regular rhythm. No murmurs, rubs, or gallops. Respiratory: No tachypnea, wheezing and rhonchi bilaterally Gastrointestinal: Soft and nontender. Normal bowel sounds Musculoskeletal: Nontender with normal range of motion in extremities. No lower extremity tenderness nor edema. Neurologic:  Normal speech and language. No gross focal neurologic deficits are appreciated.  Skin:  Skin is warm, dry and intact. No rash noted. Psychiatric: Mood and affect are normal. Speech and behavior are normal.  ____________________________________________  EKG: Interpreted by me.  Sinus rhythm the rate of 68 bpm, normal PR interval, normal QRS, normal QT.  ____________________________________________  ED COURSE:  As part of my medical decision making, I reviewed the following data within the [place] History obtained from family if available, nursing notes, old chart and ekg, as well as notes from prior ED visits. Patient presented for shortness of breath with wheezing, we will assess with labs and imaging as indicated at this time.   Procedures ____________________________________________   LABS (pertinent  positives/negatives)  Labs Reviewed  CBC WITH DIFFERENTIAL/PLATELET - Abnormal; Notable for the following components:      Result Value   Hemoglobin 11.6 (*)    MCV 75.8 (*)    MCH 24.9 (*)    RDW 14.8 (*)    Lymphs Abs 0.9 (*)    All other components within normal limits  BASIC METABOLIC PANEL - Abnormal; Notable for the following components:   Potassium 2.9 (*)    Chloride 98 (*)    Glucose, Bld 108 (*)    All other components within normal limits  BRAIN NATRIURETIC PEPTIDE  TROPONIN I    RADIOLOGY Images were viewed by me  Chest x-ray IMPRESSION: Subsegmental atelectasis or early pneumonia in the right upper lobe and in the lingula. Underlying bronchitic-smoking related changes. Followup PA and lateral chest X-ray is recommended in 3-4 weeks following trial of antibiotic therapy to ensure resolution and exclude underlying malignancy.  Thoracic aortic atherosclerosis. ____________________________________________  DIFFERENTIAL DIAGNOSIS   COPD, bronchitis, pneumonia, URI, seasonal allergy  FINAL ASSESSMENT AND PLAN  Pneumonia, bronchospasm   Plan: The patient had presented for cough for the past 2 days with shortness of breath.. Patient's labs were grossly unremarkable, she had mild hypokalemia and was treated orally with potassium. Patient's imaging were indicative of atelectasis or early pneumonia and she was started on IV Levaquin here.  She received duo nebs and steroids here as well as the Levaquin.  She will be discharged home with similar with outpatient follow-up for recheck.   Laurence Aly, MD   Note: This note was generated in part or whole with voice recognition software. Voice recognition is usually quite accurate but there are transcription errors that can and very often do occur. I apologize for any typographical errors that were not detected and corrected.     Earleen Newport, MD 07/09/17 [phone]

## 2017-07-09 NOTE — ED Notes (Signed)
First Nurse Note:  Patient sounds congested.  RR-16, pulse ox 97%.  Alert and oriented. Color good.

## 2017-07-09 NOTE — ED Triage Notes (Signed)
Pt to ED via POV c/o shortness of breath x 2 days. Pt states that she has been borrowing her sisters oxygen. Pt states that she has never been told she needed oxygen. Pt has audible wheezing in triage. Pt in NAD at this time.

## 2017-08-25 ENCOUNTER — Encounter (INDEPENDENT_AMBULATORY_CARE_PROVIDER_SITE_OTHER): Payer: Self-pay | Admitting: Vascular Surgery

## 2017-08-25 ENCOUNTER — Ambulatory Visit (INDEPENDENT_AMBULATORY_CARE_PROVIDER_SITE_OTHER): Payer: Medicare HMO | Admitting: Vascular Surgery

## 2017-08-25 DIAGNOSIS — I872 Venous insufficiency (chronic) (peripheral): Secondary | ICD-10-CM

## 2017-08-25 DIAGNOSIS — I8312 Varicose veins of left lower extremity with inflammation: Secondary | ICD-10-CM | POA: Diagnosis not present

## 2017-08-25 DIAGNOSIS — M7989 Other specified soft tissue disorders: Secondary | ICD-10-CM | POA: Diagnosis not present

## 2017-08-25 DIAGNOSIS — I8311 Varicose veins of right lower extremity with inflammation: Secondary | ICD-10-CM

## 2017-08-31 ENCOUNTER — Encounter (INDEPENDENT_AMBULATORY_CARE_PROVIDER_SITE_OTHER): Payer: Self-pay | Admitting: Vascular Surgery

## 2017-08-31 DIAGNOSIS — M7989 Other specified soft tissue disorders: Secondary | ICD-10-CM | POA: Insufficient documentation

## 2017-08-31 DIAGNOSIS — I872 Venous insufficiency (chronic) (peripheral): Secondary | ICD-10-CM | POA: Insufficient documentation

## 2017-08-31 DIAGNOSIS — I8312 Varicose veins of left lower extremity with inflammation: Principal | ICD-10-CM

## 2017-08-31 DIAGNOSIS — I8311 Varicose veins of right lower extremity with inflammation: Secondary | ICD-10-CM | POA: Insufficient documentation

## 2017-08-31 NOTE — Progress Notes (Signed)
MRN : [id number]  Paula Klein is a 72 y.o. ([birth year]) female who presents with chief complaint of  Chief Complaint  Patient presents with  . New Patient (Initial Visit)    ref Lennox Grumbles for claudication  .  History of Present Illness:   Patient is seen for evaluation of leg pain and leg swelling. The patient first noticed the swelling remotely. The swelling is associated with pain and discoloration. The pain and swelling worsens with prolonged dependency and improves with elevation. The pain is unrelated to activity.  The patient notes that in the morning the legs are significantly improved but they steadily worsened throughout the course of the day. The patient also notes a steady worsening of the discoloration in the ankle and shin area.   The patient denies claudication symptoms.  The patient denies symptoms consistent with rest pain.  The patient denies and extensive history of DJD and LS spine disease.  The patient has no had any past angiography, interventions or vascular surgery.  Elevation makes the leg symptoms better, dependency makes them much worse. There is no history of ulcerations. The patient denies any recent changes in medications.  The patient has not been wearing graduated compression.  The patient denies a history of DVT or PE. There is no prior history of phlebitis. There is no history of primary lymphedema.  No history of malignancies. No history of trauma or groin or pelvic surgery. There is no history of radiation treatment to the groin or pelvis  The patient denies amaurosis fugax or recent TIA symptoms. There are no recent neurological changes noted. The patient denies recent episodes of angina or shortness of breath  Current Meds  Medication Sig  . albuterol (PROVENTIL HFA) 108 (90 Base) MCG/ACT inhaler Inhale 2 puffs into the lungs every 6 (six) hours as needed for wheezing or shortness of breath.  Marland Kitchen albuterol (PROVENTIL HFA;VENTOLIN HFA) 108 (90  Base) MCG/ACT inhaler Inhale 2 puffs into the lungs every 6 (six) hours as needed for wheezing or shortness of breath.  Marland Kitchen aspirin EC 81 MG tablet Take 81 mg by mouth daily.  Marland Kitchen atenolol (TENORMIN) 25 MG tablet Take 25 mg by mouth daily.  . calcium citrate-vitamin D (CITRACAL+D) 315-200 MG-UNIT tablet Take 1 tablet by mouth 2 (two) times daily.  . clonazePAM (KLONOPIN) 0.5 MG tablet Take 0.5 mg by mouth daily as needed for anxiety.  Marland Kitchen ELIQUIS 5 MG TABS tablet Take 5 mg by mouth 2 (two) times daily.   Marland Kitchen estradiol (ESTRACE) 0.5 MG tablet Take 0.5 mg by mouth daily.  Marland Kitchen ibuprofen (ADVIL,MOTRIN) 600 MG tablet Take 1 tablet (600 mg total) by mouth every 8 (eight) hours as needed.  Marland Kitchen lisinopril-hydrochlorothiazide (PRINZIDE,ZESTORETIC) 20-12.5 MG tablet Take 1 tablet by mouth daily.  Marland Kitchen loratadine (CLARITIN) 10 MG tablet Take 10 mg by mouth daily.  . magnesium oxide (MAG-OX) 400 MG tablet Take 800 mg by mouth daily.  . medroxyPROGESTERone (PROVERA) 2.5 MG tablet Take 2.5 mg by mouth daily.  . vitamin C (ASCORBIC ACID) 500 MG tablet Take 1,500 mg by mouth daily.  . vitamin E 1000 UNIT capsule Take 1,000 Units by mouth daily.    Past Medical History:  Diagnosis Date  . H/O blood clots   . Hypertension     Past Surgical History:  Procedure Laterality Date  . COLONOSCOPY WITH PROPOFOL N/A 09/26/2015   Procedure: COLONOSCOPY WITH PROPOFOL;  Surgeon: Lollie Sails, MD;  Location: [facility] ENDOSCOPY;  Service: Endoscopy;  Laterality: N/A;  . NO PAST SURGERIES      Social History Social History   Tobacco Use  . Smoking status: Former Research scientist (life sciences)  . Smokeless tobacco: Never Used  Substance Use Topics  . Alcohol use: No  . Drug use: No    Family History Family History  Problem Relation Age of Onset  . Varicose Veins Mother   . Heart attack Father   . Cancer Brother   No family history of bleeding/clotting disorders, porphyria or autoimmune disease   Allergies  Allergen Reactions  . Pine Tar  Cough  . Sulfa Antibiotics   . Tetracyclines & Related      REVIEW OF SYSTEMS (Negative unless checked)  Constitutional: [] Weight loss  [] Fever  [] Chills Cardiac: [] Chest pain   [] Chest pressure   [] Palpitations   [] Shortness of breath when laying flat   [] Shortness of breath with exertion. Vascular:  [] Pain in legs with walking   [x] Pain in legs with standing  [] History of DVT   [] Phlebitis   [x] Swelling in legs   [x] Varicose veins   [] Non-healing ulcers Pulmonary:   [] Uses home oxygen   [] Productive cough   [] Hemoptysis   [] Wheeze  [] COPD   [] Asthma Neurologic:  [] Dizziness   [] Seizures   [] History of stroke   [] History of TIA  [] Aphasia   [] Vissual changes   [] Weakness or numbness in arm   [] Weakness or numbness in leg Musculoskeletal:   [] Joint swelling   [] Joint pain   [] Low back pain Hematologic:  [] Easy bruising  [] Easy bleeding   [] Hypercoagulable state   [] Anemic Gastrointestinal:  [] Diarrhea   [] Vomiting  [] Gastroesophageal reflux/heartburn   [] Difficulty swallowing. Genitourinary:  [] Chronic kidney disease   [] Difficult urination  [] Frequent urination   [] Blood in urine Skin:  [] Rashes   [] Ulcers  Psychological:  [] History of anxiety   []  History of major depression.  Physical Examination  Vitals:   08/25/17 1535  BP: (!) 145/64  Pulse: 71  Resp: 16  Weight: 153 lb 9.6 oz (69.7 kg)  Height: 5\' 1"  (1.549 m)   Body mass index is 29.02 kg/m. Gen: WD/WN, NAD Head: [place]/AT, No temporalis wasting.  Ear/Nose/Throat: Hearing grossly intact, nares w/o erythema or drainage, poor dentition Eyes: PER, EOMI, sclera nonicteric.  Neck: Supple, no masses.  No bruit or JVD.  Pulmonary:  Good air movement, clear to auscultation bilaterally, no use of accessory muscles.  Cardiac: RRR, normal S1, S2, no Murmurs. Vascular: scattered large varicosities, (>8 mm) present bilaterally.  Mild venous stasis changes to the legs bilaterally.  2+ soft pitting edema  Vessel Right Left  Radial  Palpable Palpable  Ulnar Palpable Palpable  Brachial Palpable Palpable  Carotid Palpable Palpable  Femoral Palpable Palpable  Popliteal Palpable Palpable  PT Palpable Palpable  DP Palpable Palpable   Gastrointestinal: soft, non-distended. No guarding/no peritoneal signs.  Musculoskeletal: M/S 5/5 throughout.  No deformity or atrophy.  Neurologic: CN 2-12 intact. Pain and light touch intact in extremities.  Symmetrical.  Speech is fluent. Motor exam as listed above. Psychiatric: Judgment intact, Mood & affect appropriate for pt's clinical situation. Dermatologic: No rashes or ulcers noted.  No changes consistent with cellulitis. Lymph : No Cervical lymphadenopathy, no lichenification or skin changes of chronic lymphedema.  CBC Lab Results  Component Value Date   WBC 7.1 07/09/2017   HGB 11.6 (L) 07/09/2017   HCT 35.2 07/09/2017   MCV 75.8 (L) 07/09/2017   PLT 329 07/09/2017    BMET    Component  Value Date/Time   NA 136 07/09/2017 0939   K 2.9 (L) 07/09/2017 0939   CL 98 (L) 07/09/2017 0939   CO2 29 07/09/2017 0939   GLUCOSE 108 (H) 07/09/2017 0939   BUN 10 07/09/2017 0939   CREATININE 0.59 07/09/2017 0939   CALCIUM 9.4 07/09/2017 0939   GFRNONAA >60 07/09/2017 0939   GFRAA >60 07/09/2017 0939   CrCl cannot be calculated (Patient's most recent lab result is older than the maximum 21 days allowed.).  COAG No results found for: INR, PROTIME  Radiology No results found.  Assessment/Plan 1. Varicose veins of both lower extremities with inflammation  Recommend:  The patient has large symptomatic varicose veins that are painful and associated with swelling.  I have had a long discussion with the patient regarding  varicose veins and why they cause symptoms.  Patient will begin wearing graduated compression stockings class 1 on a daily basis, beginning first thing in the morning and removing them in the evening. The patient is instructed specifically not to sleep in the  stockings.    The patient  will also begin using over-the-counter analgesics such as Motrin 600 mg po TID to help control the symptoms.    In addition, behavioral modification including elevation during the day will be initiated.    Pending the results of these changes the  patient will be reevaluated in three months.   An  ultrasound of the venous system will be obtained.   Further plans will be based on the ultrasound results and whether conservative therapies are successful at eliminating the pain and swelling.   - VAS [place] LOWER EXTREMITY VENOUS REFLUX; Future  2. Chronic venous insufficiency No surgery or intervention at this point in time.    I have had a long discussion with the patient regarding venous insufficiency and why it  causes symptoms. I have discussed with the patient the chronic skin changes that accompany venous insufficiency and the long term sequela such as infection and ulceration.  Patient will begin wearing graduated compression stockings class 1 (20-30 mmHg) or compression wraps on a daily basis a prescription was given. The patient will put the stockings on first thing in the morning and removing them in the evening. The patient is instructed specifically not to sleep in the stockings.    In addition, behavioral modification including several periods of elevation of the lower extremities during the day will be continued. I have demonstrated that proper elevation is a position with the ankles at heart level.  The patient is instructed to begin routine exercise, especially walking on a daily basis  Patient should undergo duplex ultrasound of the venous system to ensure that DVT or reflux is not present.  Following the review of the ultrasound the patient will follow up in 2-3 months to reassess the degree of swelling and the control that graduated compression stockings or compression wraps  is offering.   The patient can be assessed for a Lymph Pump at that time  3.  Swelling of limb See #1&2    Hortencia Pilar, MD  08/31/2017 2:20 PM

## 2017-10-15 NOTE — Progress Notes (Signed)
MRN : [id number]  Paula Klein is a 72 y.o. (1945-12-13) female who presents with chief complaint of No chief complaint on file. Marland Kitchen  History of Present Illness:      MRN : [id number]  Paula Klein is a 72 y.o. (April 28, 1945) female who presents with chief complaint of No chief complaint on file. Marland Kitchen  History of Present Illness: The patient returns for followup evaluation 3 months after the initial visit. The patient continues to have pain in the lower extremities with dependency. The pain is lessened with elevation. Graduated compression stockings, Class I (20-30 mmHg), have been worn but the stockings do not eliminate the leg pain. Over-the-counter analgesics do not improve the symptoms. The degree of discomfort continues to interfere with daily activities. The patient notes the pain in the legs is causing problems with daily exercise, at the workplace and even with household activities and maintenance such as standing in the kitchen preparing meals and doing dishes.   Venous ultrasound shows normal deep venous system, no evidence of acute or chronic DVT.  Superficial reflux is present in the GSV bilaterally  No outpatient medications have been marked as taking for the 10/16/17 encounter (Appointment) with Delana Meyer, Dolores Lory, MD.    Past Medical History:  Diagnosis Date  . H/O blood clots   . Hypertension     Past Surgical History:  Procedure Laterality Date  . COLONOSCOPY WITH PROPOFOL N/A 09/26/2015   Procedure: COLONOSCOPY WITH PROPOFOL;  Surgeon: Lollie Sails, MD;  Location: [facility] ENDOSCOPY;  Service: Endoscopy;  Laterality: N/A;  . NO PAST SURGERIES      Social History Social History   Tobacco Use  . Smoking status: Former Research scientist (life sciences)  . Smokeless tobacco: Never Used  Substance Use Topics  . Alcohol use: No  . Drug use: No    Family History Family History  Problem Relation Age of Onset  . Varicose Veins Mother   . Heart attack Father   . Cancer Brother      Allergies  Allergen Reactions  . Pine Tar Cough  . Sulfa Antibiotics   . Tetracyclines & Related      REVIEW OF SYSTEMS (Negative unless checked)  Constitutional: [] Weight loss  [] Fever  [] Chills Cardiac: [] Chest pain   [] Chest pressure   [] Palpitations   [] Shortness of breath when laying flat   [] Shortness of breath with exertion. Vascular:  [] Pain in legs with walking   [x] Pain in legs at rest  [] History of DVT   [] Phlebitis   [x] Swelling in legs   [x] Varicose veins   [] Non-healing ulcers Pulmonary:   [] Uses home oxygen   [] Productive cough   [] Hemoptysis   [] Wheeze  [] COPD   [] Asthma Neurologic:  [] Dizziness   [] Seizures   [] History of stroke   [] History of TIA  [] Aphasia   [] Vissual changes   [] Weakness or numbness in arm   [] Weakness or numbness in leg Musculoskeletal:   [] Joint swelling   [] Joint pain   [] Low back pain Hematologic:  [] Easy bruising  [] Easy bleeding   [] Hypercoagulable state   [] Anemic Gastrointestinal:  [] Diarrhea   [] Vomiting  [] Gastroesophageal reflux/heartburn   [] Difficulty swallowing. Genitourinary:  [] Chronic kidney disease   [] Difficult urination  [] Frequent urination   [] Blood in urine Skin:  [] Rashes   [] Ulcers  Psychological:  [] History of anxiety   []  History of major depression.  Physical Examination  There were no vitals filed for this visit. There is no height or weight on file to  calculate BMI. Gen: WD/WN, NAD Head: [place]/AT, No temporalis wasting.  Ear/Nose/Throat: Hearing grossly intact, nares w/o erythema or drainage Eyes: PER, EOMI, sclera nonicteric.  Neck: Supple, no large masses.   Pulmonary:  Good air movement, no audible wheezing bilaterally, no use of accessory muscles.  Cardiac: RRR, no JVD Vascular: Large varicosities present extensively greater than 10 mm bilaterally.  Mild venous stasis changes to the legs bilaterally.  2+ soft pitting edema Vessel Right Left  Radial Palpable Palpable  PT Palpable Palpable  DP Palpable  Palpable  Gastrointestinal: Non-distended. No guarding/no peritoneal signs.  Musculoskeletal: M/S 5/5 throughout.  No deformity or atrophy.  Neurologic: CN 2-12 intact. Symmetrical.  Speech is fluent. Motor exam as listed above. Psychiatric: Judgment intact, Mood & affect appropriate for pt's clinical situation. Dermatologic: Venous rashes no ulcers noted.  No changes consistent with cellulitis. Lymph : No lichenification or skin changes of chronic lymphedema.  CBC Lab Results  Component Value Date   WBC 7.1 07/09/2017   HGB 11.6 (L) 07/09/2017   HCT 35.2 07/09/2017   MCV 75.8 (L) 07/09/2017   PLT 329 07/09/2017    BMET    Component Value Date/Time   NA 136 07/09/2017 0939   K 2.9 (L) 07/09/2017 0939   CL 98 (L) 07/09/2017 0939   CO2 29 07/09/2017 0939   GLUCOSE 108 (H) 07/09/2017 0939   BUN 10 07/09/2017 0939   CREATININE 0.59 07/09/2017 0939   CALCIUM 9.4 07/09/2017 0939   GFRNONAA >60 07/09/2017 0939   GFRAA >60 07/09/2017 0939   CrCl cannot be calculated (Patient's most recent lab result is older than the maximum 21 days allowed.).  COAG No results found for: INR, PROTIME  Radiology No results found.   Assessment/Plan 1. Varicose veins of both lower extremities with inflammation Recommend  I have reviewed my previous  discussion with the patient regarding  varicose veins and why they cause symptoms. Patient will continue  wearing graduated compression stockings class 1 on a daily basis, beginning first thing in the morning and removing them in the evening.    In addition, behavioral modification including elevation during the day was again discussed and this will continue.  The patient has utilized over the counter pain medications and has been exercising.  However, at this time conservative therapy has not alleviated the patient's symptoms of leg pain and swelling  Recommend: laser ablation of the right and  left great saphenous veins to eliminate the  symptoms of pain and swelling of the lower extremities caused by the severe superficial venous reflux disease.   Right leg will be treated first  2. Chronic venous insufficiency No surgery or intervention at this point in time.    I have had a long discussion with the patient regarding venous insufficiency and why it  causes symptoms. I have discussed with the patient the chronic skin changes that accompany venous insufficiency and the long term sequela such as infection and ulceration.  Patient will begin wearing graduated compression stockings class 1 (20-30 mmHg) or compression wraps on a daily basis a prescription was given. The patient will put the stockings on first thing in the morning and removing them in the evening. The patient is instructed specifically not to sleep in the stockings.    In addition, behavioral modification including several periods of elevation of the lower extremities during the day will be continued. I have demonstrated that proper elevation is a position with the ankles at heart level.  The patient  is instructed to begin routine exercise, especially walking on a daily basis  Patient should undergo duplex ultrasound of the venous system to ensure that DVT or reflux is not present.  Following the review of the ultrasound the patient will follow up in 2-3 months to reassess the degree of swelling and the control that graduated compression stockings or compression wraps  is offering.   The patient can be assessed for a Lymph Pump at that time  3. Swelling of limb See #1&2    Hortencia Pilar, MD  10/15/2017 8:28 PM

## 2017-10-16 ENCOUNTER — Ambulatory Visit (INDEPENDENT_AMBULATORY_CARE_PROVIDER_SITE_OTHER): Payer: Medicare HMO

## 2017-10-16 ENCOUNTER — Encounter (INDEPENDENT_AMBULATORY_CARE_PROVIDER_SITE_OTHER): Payer: Self-pay | Admitting: Vascular Surgery

## 2017-10-16 ENCOUNTER — Ambulatory Visit (INDEPENDENT_AMBULATORY_CARE_PROVIDER_SITE_OTHER): Payer: Medicare HMO | Admitting: Vascular Surgery

## 2017-10-16 ENCOUNTER — Encounter (INDEPENDENT_AMBULATORY_CARE_PROVIDER_SITE_OTHER): Payer: Self-pay

## 2017-10-16 ENCOUNTER — Encounter

## 2017-10-16 VITALS — BP 146/77 | HR 56 | Resp 16 | Ht 61.0 in | Wt 147.0 lb

## 2017-10-16 DIAGNOSIS — I8312 Varicose veins of left lower extremity with inflammation: Secondary | ICD-10-CM

## 2017-10-16 DIAGNOSIS — I8311 Varicose veins of right lower extremity with inflammation: Secondary | ICD-10-CM | POA: Diagnosis not present

## 2017-10-16 DIAGNOSIS — I872 Venous insufficiency (chronic) (peripheral): Secondary | ICD-10-CM | POA: Diagnosis not present

## 2017-10-16 DIAGNOSIS — M7989 Other specified soft tissue disorders: Secondary | ICD-10-CM | POA: Diagnosis not present

## 2017-10-25 ENCOUNTER — Encounter (INDEPENDENT_AMBULATORY_CARE_PROVIDER_SITE_OTHER): Payer: Self-pay | Admitting: Vascular Surgery

## 2017-12-09 ENCOUNTER — Other Ambulatory Visit (INDEPENDENT_AMBULATORY_CARE_PROVIDER_SITE_OTHER): Payer: Self-pay | Admitting: Vascular Surgery

## 2017-12-09 DIAGNOSIS — Z9889 Other specified postprocedural states: Secondary | ICD-10-CM

## 2017-12-10 IMAGING — US US EXTREM LOW VENOUS*R*
1 series · 13 of 24 positions shown · non-contrast
Comparison: None.

CLINICAL DATA: History of superficial thrombophlebitis, check for
resolution



[Series 1: us extrem low venous*right* · 0.07mm/px · 13 of 39 slices shown]
[im 1/39]
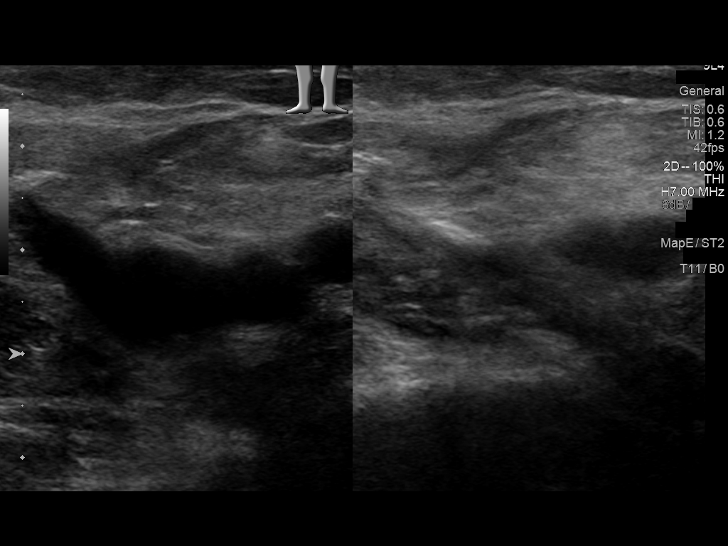
[im 4/39]
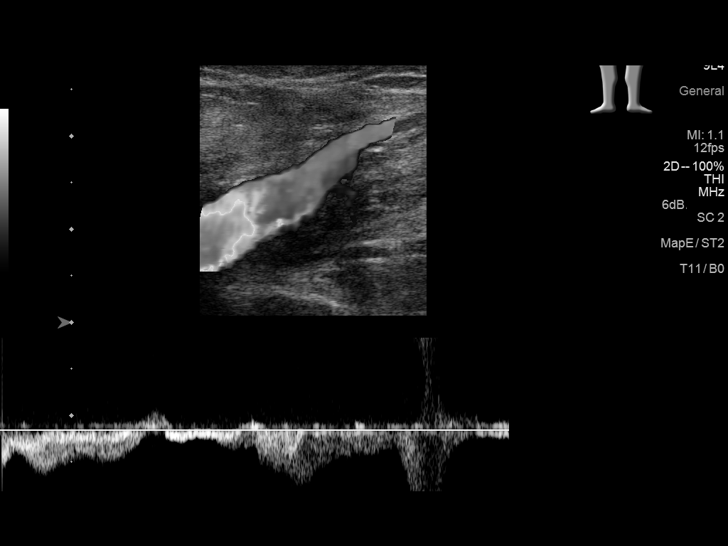
[im 7/39]
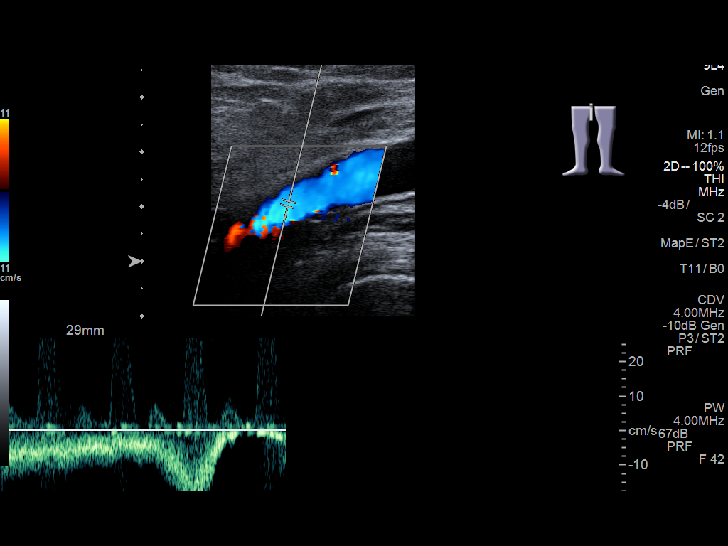
[im 10/39]
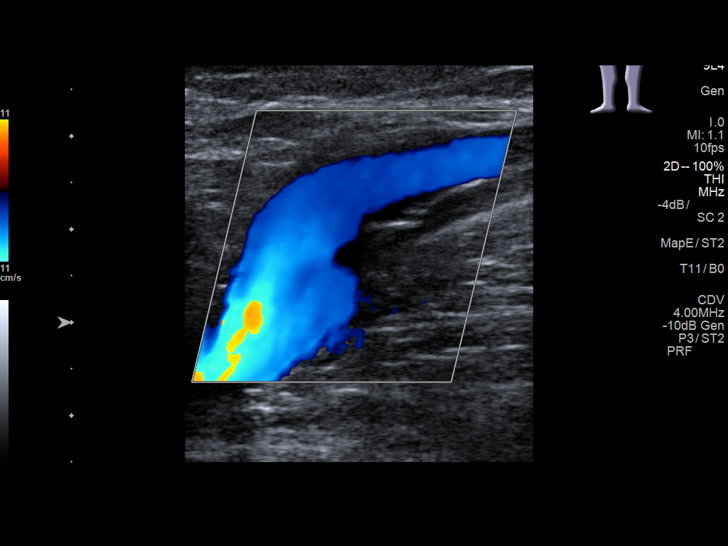
[im 14/39]
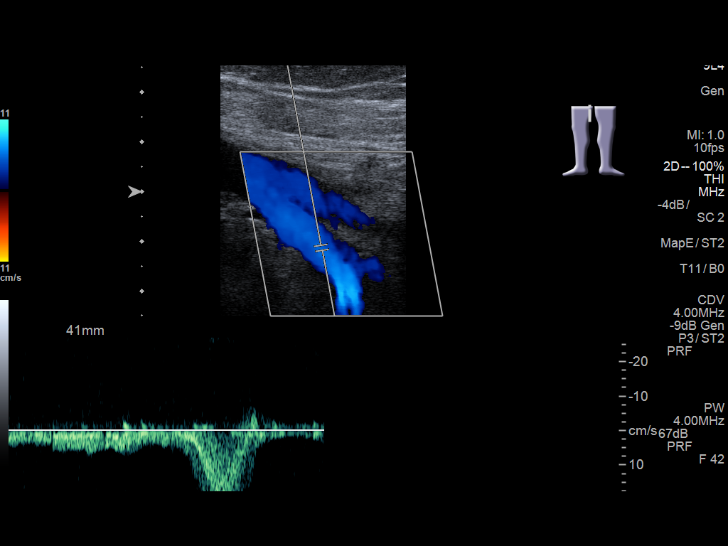
[im 17/39]
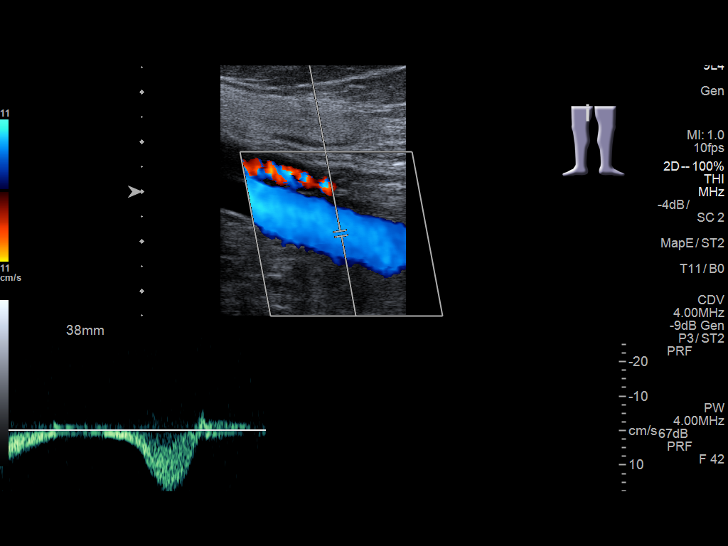
[im 20/39]
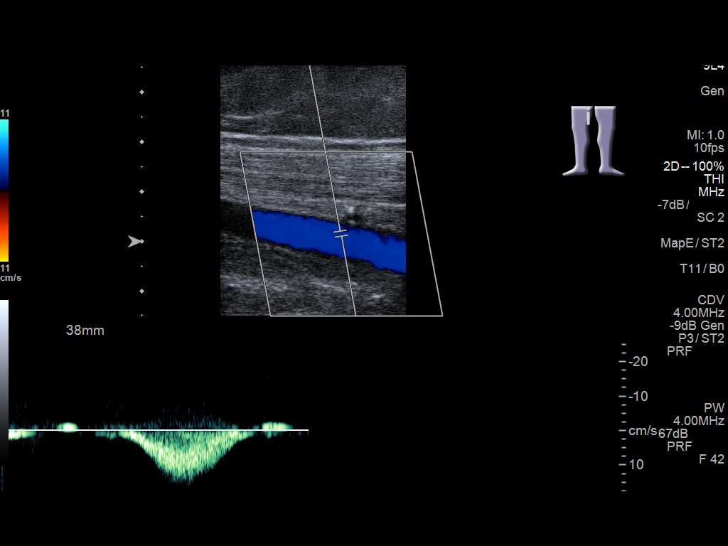
[im 22/39]
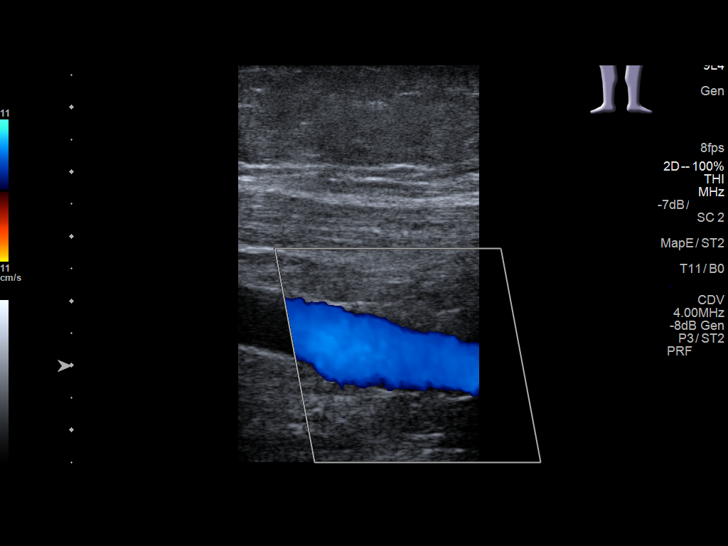
[im 25/39]
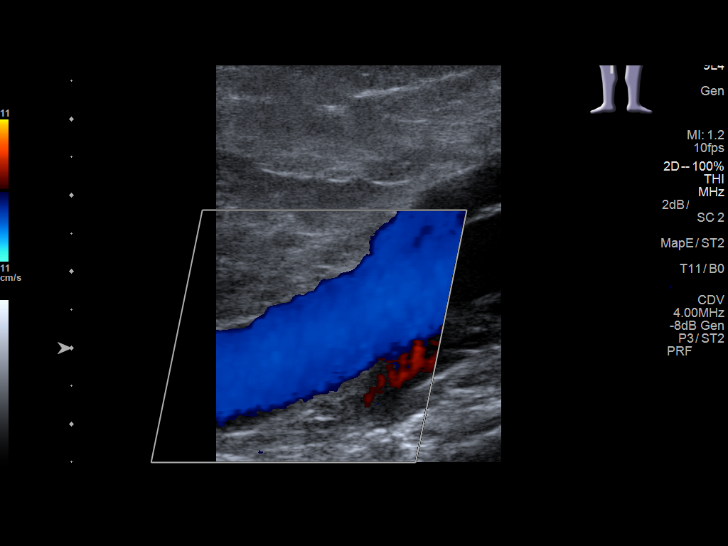
[im 29/39]
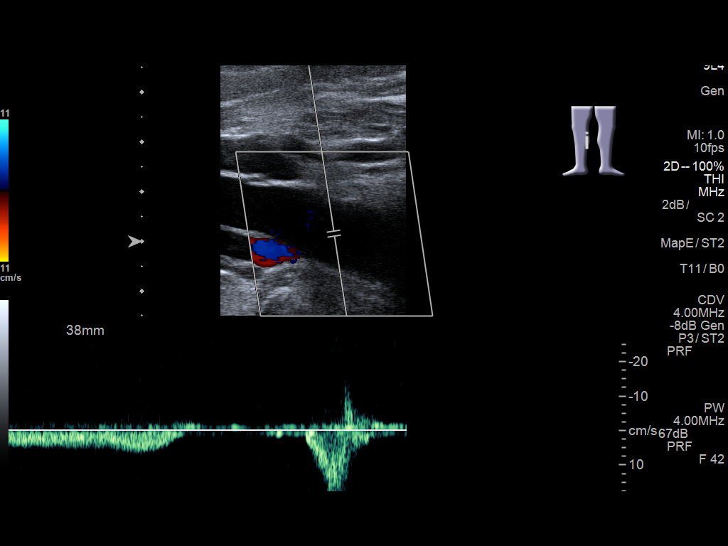
[im 32/39]
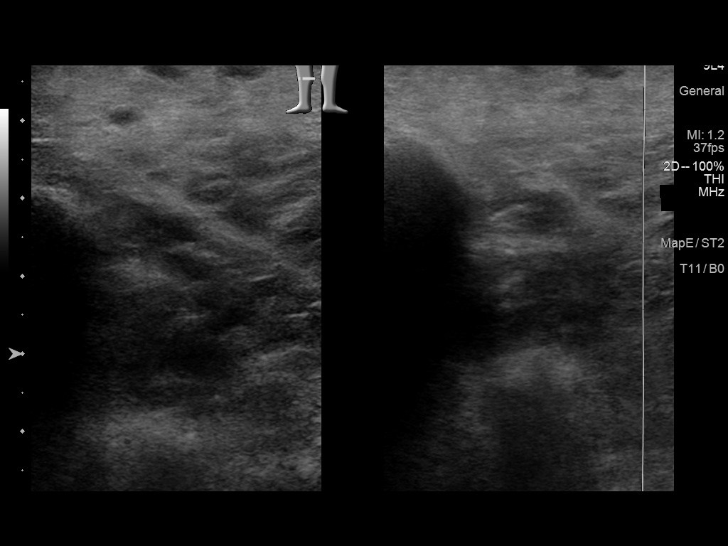
[im 35/39]
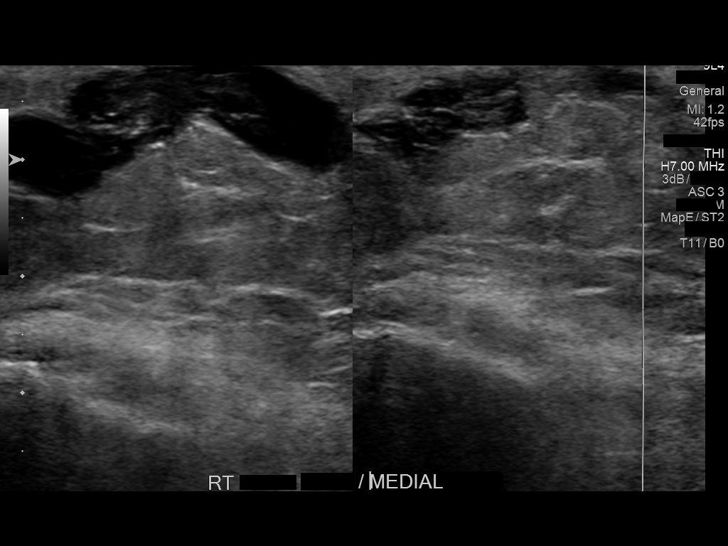
[im 39/39]
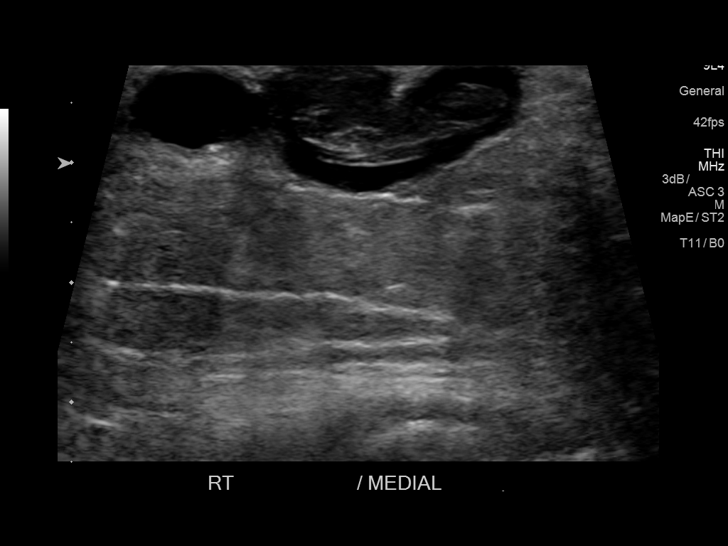

[13 of 24 positions shown; findings below may reference images not displayed]

FINDINGS: Contralateral Common Femoral Vein: Respiratory phasicity is normal
and symmetric with the symptomatic side. No evidence of thrombus.
Normal compressibility.

Common Femoral Vein: No evidence of thrombus. Normal
compressibility, respiratory phasicity and response to augmentation.

Saphenofemoral Junction: No evidence of thrombus. Normal
compressibility and flow on color Doppler imaging.

Profunda Femoral Vein: No evidence of thrombus. Normal
compressibility and flow on color Doppler imaging.

Femoral Vein: No evidence of thrombus. Normal compressibility,
respiratory phasicity and response to augmentation.

Popliteal Vein: No evidence of thrombus. Normal compressibility,
respiratory phasicity and response to augmentation.

Calf Veins: No evidence of thrombus. Normal compressibility and flow
on color Doppler imaging.

Superficial Great Saphenous Vein: No evidence of thrombus. Normal
compressibility and flow on color Doppler imaging.

Venous Reflux:  None.

Other Findings: There again noted metal pole varicosities noted in
the region of the proximal calf and medial knee. Many of these
demonstrate thrombus within although some persistent flow is noted.
The degree of thrombus has regressed somewhat in the interval
consistent with some clot retraction.
IMPRESSION: No deep venous thrombosis is noted.

Changes of superficial thrombosis are again seen although somewhat
improved as described.

## 2017-12-11 ENCOUNTER — Encounter (INDEPENDENT_AMBULATORY_CARE_PROVIDER_SITE_OTHER): Payer: Self-pay | Admitting: Vascular Surgery

## 2017-12-11 ENCOUNTER — Ambulatory Visit (INDEPENDENT_AMBULATORY_CARE_PROVIDER_SITE_OTHER): Payer: Medicare HMO | Admitting: Vascular Surgery

## 2017-12-11 VITALS — BP 127/75 | HR 72 | Resp 14 | Ht 63.0 in | Wt 145.0 lb

## 2017-12-11 DIAGNOSIS — I8312 Varicose veins of left lower extremity with inflammation: Secondary | ICD-10-CM

## 2017-12-11 DIAGNOSIS — I8311 Varicose veins of right lower extremity with inflammation: Secondary | ICD-10-CM | POA: Diagnosis not present

## 2017-12-11 NOTE — Progress Notes (Signed)
    MRN : [id number]  CHANIAH CISSE is a 72 y.o. ([birth year]) female who presents with chief complaint of  Chief Complaint  Patient presents with  . Follow-up    Right GSV laser ablation  .    The patient's right lower extremity was sterilely prepped and draped.  The ultrasound machine was used to visualize the great saphenous vein throughout its course.  A segment in the mid thigh was selected for access.  The saphenous vein was accessed without difficulty using ultrasound guidance with a micropuncture needle.   An 0.018  wire was placed beyond the saphenofemoral junction through the sheath and the microneedle was removed.  The 65 cm sheath was then placed over the wire and the wire and dilator were removed.  The laser fiber was placed through the sheath and its tip was placed approximately 2 cm below the saphenofemoral junction.  Tumescent anesthesia was then created with a dilute lidocaine solution.  Laser energy was then delivered with constant withdrawal of the sheath and laser fiber.  Approximately 554 Joules of energy were delivered over a length of 11 cm.  Sterile dressings were placed.  The patient tolerated the procedure well without complications.  Following this a total of 6 cc of foam was injected inlarge varicose veins at the knee level with ultrasound guidance.

## 2017-12-15 ENCOUNTER — Ambulatory Visit (INDEPENDENT_AMBULATORY_CARE_PROVIDER_SITE_OTHER): Payer: Medicare HMO

## 2017-12-15 DIAGNOSIS — Z9889 Other specified postprocedural states: Secondary | ICD-10-CM

## 2017-12-22 ENCOUNTER — Telehealth (INDEPENDENT_AMBULATORY_CARE_PROVIDER_SITE_OTHER): Payer: Self-pay

## 2017-12-22 NOTE — Telephone Encounter (Signed)
Is not advised that she take an 800 mg ibuprofen while on blood thinners, because NSAIDs can irritate the lining of the stomach as well as increased risk of bleeding.  Tylenol would be a better choice. She may begin tanning once again in her home, however she should be aware of the risks that tanning proposes to skin by continued exposure to UV Rays.

## 2017-12-22 NOTE — Telephone Encounter (Signed)
Patient called to ask if it's okay to take Ibuprofen 800 mg while on her blood thinner? She also would like to know if it's okay for her to start back tanning in her home, because she is "turning back white" because she has not been using it since she's gotten the laser procedure?

## 2017-12-22 NOTE — Telephone Encounter (Signed)
Called the patient and gave her the advise that [place] shared.

## 2017-12-25 ENCOUNTER — Ambulatory Visit (INDEPENDENT_AMBULATORY_CARE_PROVIDER_SITE_OTHER): Payer: Medicare HMO | Admitting: Vascular Surgery

## 2017-12-25 ENCOUNTER — Encounter (INDEPENDENT_AMBULATORY_CARE_PROVIDER_SITE_OTHER): Payer: Self-pay | Admitting: Vascular Surgery

## 2017-12-25 VITALS — BP 122/75 | HR 67 | Resp 16 | Ht 65.0 in | Wt 148.0 lb

## 2017-12-25 DIAGNOSIS — I8312 Varicose veins of left lower extremity with inflammation: Secondary | ICD-10-CM | POA: Diagnosis not present

## 2017-12-25 DIAGNOSIS — I8311 Varicose veins of right lower extremity with inflammation: Secondary | ICD-10-CM

## 2017-12-27 ENCOUNTER — Encounter (INDEPENDENT_AMBULATORY_CARE_PROVIDER_SITE_OTHER): Payer: Self-pay | Admitting: Vascular Surgery

## 2017-12-27 NOTE — Progress Notes (Signed)
    MRN : [id number]  Paula Klein is a 72 y.o. ([birth year]) female who presents with chief complaint of  Chief Complaint  Patient presents with  . Follow-up    Left GSV Laser ablation  .    The patient's left lower extremity was sterilely prepped and draped.  The ultrasound machine was used to visualize the great saphenous vein throughout its course.  A segment at the knee was selected for access.  The saphenous vein was accessed without difficulty using ultrasound guidance with a micropuncture needle.   An 0.018  wire was placed beyond the saphenofemoral junction through the sheath and the microneedle was removed.  The 65 cm sheath was then placed over the wire and the wire and dilator were removed.  The laser fiber was placed through the sheath and its tip was placed approximately 2 cm below the saphenofemoral junction.  Tumescent anesthesia was then created with a dilute lidocaine solution.  Laser energy was then delivered with constant withdrawal of the sheath and laser fiber.  Approximately 1096 Joules of energy were delivered over a length of 27 cm.  Sterile dressings were placed.  The patient tolerated the procedure well without complications.

## 2017-12-29 ENCOUNTER — Other Ambulatory Visit (INDEPENDENT_AMBULATORY_CARE_PROVIDER_SITE_OTHER): Payer: Self-pay | Admitting: Vascular Surgery

## 2017-12-29 ENCOUNTER — Ambulatory Visit (INDEPENDENT_AMBULATORY_CARE_PROVIDER_SITE_OTHER): Payer: Medicare HMO

## 2017-12-29 DIAGNOSIS — I872 Venous insufficiency (chronic) (peripheral): Secondary | ICD-10-CM

## 2018-02-05 ENCOUNTER — Encounter (INDEPENDENT_AMBULATORY_CARE_PROVIDER_SITE_OTHER): Payer: Medicare HMO

## 2018-02-05 ENCOUNTER — Ambulatory Visit (INDEPENDENT_AMBULATORY_CARE_PROVIDER_SITE_OTHER): Payer: Medicare HMO | Admitting: Vascular Surgery

## 2018-03-02 ENCOUNTER — Inpatient Hospital Stay
Admission: EM | Admit: 2018-03-02 | Discharge: 2018-03-04 | DRG: 189 | Disposition: A | Discharge status: Home / Self Care | Payer: Medicare HMO | Attending: Internal Medicine | Admitting: Internal Medicine

## 2018-03-02 ENCOUNTER — Encounter: Payer: Self-pay | Admitting: Internal Medicine

## 2018-03-02 ENCOUNTER — Other Ambulatory Visit: Payer: Self-pay

## 2018-03-02 ENCOUNTER — Emergency Department: Payer: Medicare HMO

## 2018-03-02 DIAGNOSIS — Z66 Do not resuscitate: Secondary | ICD-10-CM | POA: Diagnosis present

## 2018-03-02 DIAGNOSIS — J9601 Acute respiratory failure with hypoxia: Secondary | ICD-10-CM | POA: Diagnosis present

## 2018-03-02 DIAGNOSIS — I10 Essential (primary) hypertension: Secondary | ICD-10-CM | POA: Diagnosis present

## 2018-03-02 DIAGNOSIS — R911 Solitary pulmonary nodule: Secondary | ICD-10-CM | POA: Diagnosis present

## 2018-03-02 DIAGNOSIS — E876 Hypokalemia: Secondary | ICD-10-CM | POA: Diagnosis present

## 2018-03-02 DIAGNOSIS — J209 Acute bronchitis, unspecified: Secondary | ICD-10-CM | POA: Diagnosis present

## 2018-03-02 DIAGNOSIS — J441 Chronic obstructive pulmonary disease with (acute) exacerbation: Secondary | ICD-10-CM | POA: Diagnosis present

## 2018-03-02 DIAGNOSIS — Z87891 Personal history of nicotine dependence: Secondary | ICD-10-CM

## 2018-03-02 DIAGNOSIS — Z7982 Long term (current) use of aspirin: Secondary | ICD-10-CM

## 2018-03-02 DIAGNOSIS — F419 Anxiety disorder, unspecified: Secondary | ICD-10-CM | POA: Diagnosis present

## 2018-03-02 DIAGNOSIS — Z79899 Other long term (current) drug therapy: Secondary | ICD-10-CM

## 2018-03-02 DIAGNOSIS — I8393 Asymptomatic varicose veins of bilateral lower extremities: Secondary | ICD-10-CM | POA: Diagnosis present

## 2018-03-02 DIAGNOSIS — Z8249 Family history of ischemic heart disease and other diseases of the circulatory system: Secondary | ICD-10-CM | POA: Diagnosis not present

## 2018-03-02 DIAGNOSIS — Z86718 Personal history of other venous thrombosis and embolism: Secondary | ICD-10-CM | POA: Diagnosis not present

## 2018-03-02 DIAGNOSIS — Z7901 Long term (current) use of anticoagulants: Secondary | ICD-10-CM | POA: Diagnosis not present

## 2018-03-02 DIAGNOSIS — Z791 Long term (current) use of non-steroidal anti-inflammatories (NSAID): Secondary | ICD-10-CM

## 2018-03-02 DIAGNOSIS — J440 Chronic obstructive pulmonary disease with acute lower respiratory infection: Secondary | ICD-10-CM | POA: Diagnosis present

## 2018-03-02 LAB — CBC WITH DIFFERENTIAL/PLATELET
Abs Immature Granulocytes: 0.04 10*3/uL (ref 0.00–0.07)
Basophils Absolute: 0.1 10*3/uL (ref 0.0–0.1)
Basophils Relative: 1 %
EOS ABS: 0.6 10*3/uL — AB (ref 0.0–0.5)
Eosinophils Relative: 7 %
HCT: 36.2 % (ref 36.0–46.0)
Hemoglobin: 11.6 g/dL — ABNORMAL LOW (ref 12.0–15.0)
Immature Granulocytes: 0 %
Lymphocytes Relative: 16 %
Lymphs Abs: 1.5 10*3/uL (ref 0.7–4.0)
MCH: 25.7 pg — ABNORMAL LOW (ref 26.0–34.0)
MCHC: 32 g/dL (ref 30.0–36.0)
MCV: 80.1 fL (ref 80.0–100.0)
MONOS PCT: 9 %
Monocytes Absolute: 0.8 10*3/uL (ref 0.1–1.0)
Neutro Abs: 6.2 10*3/uL (ref 1.7–7.7)
Neutrophils Relative %: 67 %
Platelets: 359 10*3/uL (ref 150–400)
RBC: 4.52 MIL/uL (ref 3.87–5.11)
RDW: 13.6 % (ref 11.5–15.5)
WBC: 9.2 10*3/uL (ref 4.0–10.5)
nRBC: 0 % (ref 0.0–0.2)

## 2018-03-02 LAB — BLOOD GAS, VENOUS
Acid-Base Excess: 4.7 mmol/L — ABNORMAL HIGH (ref 0.0–2.0)
Bicarbonate: 29.2 mmol/L — ABNORMAL HIGH (ref 20.0–28.0)
O2 Saturation: 98 %
PH VEN: 7.45 — AB (ref 7.250–7.430)
Patient temperature: 37
pCO2, Ven: 42 mmHg — ABNORMAL LOW (ref 44.0–60.0)
pO2, Ven: 99 mmHg — ABNORMAL HIGH (ref 32.0–45.0)

## 2018-03-02 LAB — TROPONIN I: Troponin I: <0.03 ng/mL (ref <0.03)

## 2018-03-02 LAB — COMPREHENSIVE METABOLIC PANEL
ALT: 18 U/L (ref 0–44)
AST: 25 U/L (ref 15–41)
Albumin: 3.8 g/dL (ref 3.5–5.0)
Alkaline Phosphatase: 65 U/L (ref 38–126)
Anion gap: 9 (ref 5–15)
BILIRUBIN TOTAL: 0.7 mg/dL (ref 0.3–1.2)
BUN: 14 mg/dL (ref 8–23)
CO2: 27 mmol/L (ref 22–32)
Calcium: 9 mg/dL (ref 8.9–10.3)
Chloride: 101 mmol/L (ref 98–111)
Creatinine, Ser: 0.74 mg/dL (ref 0.44–1.00)
GFR calc Af Amer: >60 mL/min (ref >60)
GFR calc non Af Amer: >60 mL/min (ref >60)
GLUCOSE: 108 mg/dL — AB (ref 70–99)
Potassium: 3.2 mmol/L — ABNORMAL LOW (ref 3.5–5.1)
Sodium: 137 mmol/L (ref 135–145)
TOTAL PROTEIN: 7.3 g/dL (ref 6.5–8.1)

## 2018-03-02 LAB — BRAIN NATRIURETIC PEPTIDE: B Natriuretic Peptide: 141 pg/mL — ABNORMAL HIGH (ref 0.0–100.0)

## 2018-03-02 LAB — MAGNESIUM: Magnesium: 2 mg/dL (ref 1.7–2.4)

## 2018-03-02 MED ORDER — ONDANSETRON HCL 4 MG/2ML IJ SOLN: 4.0000 mg | Freq: Four times a day (QID) | INTRAMUSCULAR | Status: DC | PRN

## 2018-03-02 MED ORDER — ACETAMINOPHEN 325 MG PO TABS: 650.0000 mg | ORAL_TABLET | Freq: Four times a day (QID) | ORAL | Status: DC | PRN

## 2018-03-02 MED ORDER — ONDANSETRON HCL 4 MG PO TABS: 4.0000 mg | ORAL_TABLET | Freq: Four times a day (QID) | ORAL | Status: DC | PRN

## 2018-03-02 MED ORDER — MEDROXYPROGESTERONE ACETATE 2.5 MG PO TABS
2.5000 mg | ORAL_TABLET | Freq: Every day | ORAL | Status: DC
  Administered 2018-03-03 – 2018-03-04 (×2): 2.5 mg via ORAL
  Filled 2018-03-02 (×2): qty 1

## 2018-03-02 MED ORDER — ATENOLOL 25 MG PO TABS
25.0000 mg | ORAL_TABLET | Freq: Every day | ORAL | Status: DC
  Administered 2018-03-03 – 2018-03-04 (×2): 25 mg via ORAL
  Filled 2018-03-02 (×2): qty 1

## 2018-03-02 MED ORDER — MAGNESIUM OXIDE 400 (241.3 MG) MG PO TABS
400.0000 mg | ORAL_TABLET | Freq: Every day | ORAL | Status: DC
  Administered 2018-03-02 – 2018-03-04 (×3): 400 mg via ORAL
  Filled 2018-03-02 (×3): qty 1

## 2018-03-02 MED ORDER — APIXABAN 5 MG PO TABS
5.0000 mg | ORAL_TABLET | Freq: Two times a day (BID) | ORAL | Status: DC
  Administered 2018-03-02 – 2018-03-04 (×4): 5 mg via ORAL
  Filled 2018-03-02 (×5): qty 1

## 2018-03-02 MED ORDER — IPRATROPIUM-ALBUTEROL 0.5-2.5 (3) MG/3ML IN SOLN
3.0000 mL | Freq: Once | RESPIRATORY_TRACT | Status: AC
  Administered 2018-03-02: 3 mL via RESPIRATORY_TRACT

## 2018-03-02 MED ORDER — ESTRADIOL 1 MG PO TABS
0.5000 mg | ORAL_TABLET | Freq: Every day | ORAL | Status: DC
  Administered 2018-03-03 – 2018-03-04 (×2): 0.5 mg via ORAL
  Filled 2018-03-02 (×2): qty 0.5

## 2018-03-02 MED ORDER — METHYLPREDNISOLONE SODIUM SUCC 125 MG IJ SOLR
125.0000 mg | Freq: Once | INTRAMUSCULAR | Status: AC
  Administered 2018-03-02: 125 mg via INTRAVENOUS
  Filled 2018-03-02: qty 2

## 2018-03-02 MED ORDER — POLYETHYLENE GLYCOL 3350 17 G PO PACK: 17.0000 g | PACK | Freq: Every day | ORAL | Status: DC | PRN

## 2018-03-02 MED ORDER — ACETAMINOPHEN 650 MG RE SUPP: 650.0000 mg | Freq: Four times a day (QID) | RECTAL | Status: DC | PRN

## 2018-03-02 MED ORDER — IPRATROPIUM-ALBUTEROL 0.5-2.5 (3) MG/3ML IN SOLN
RESPIRATORY_TRACT | Status: AC
  Filled 2018-03-02: qty 9

## 2018-03-02 MED ORDER — POTASSIUM CHLORIDE CRYS ER 20 MEQ PO TBCR
40.0000 meq | EXTENDED_RELEASE_TABLET | Freq: Once | ORAL | Status: AC
  Administered 2018-03-02: 40 meq via ORAL
  Filled 2018-03-02: qty 2

## 2018-03-02 MED ORDER — LORATADINE 10 MG PO TABS
10.0000 mg | ORAL_TABLET | Freq: Every day | ORAL | Status: DC
  Administered 2018-03-02 – 2018-03-04 (×3): 10 mg via ORAL
  Filled 2018-03-02 (×3): qty 1

## 2018-03-02 MED ORDER — HYDROCHLOROTHIAZIDE 12.5 MG PO CAPS
12.5000 mg | ORAL_CAPSULE | Freq: Two times a day (BID) | ORAL | Status: DC
  Administered 2018-03-02 – 2018-03-04 (×4): 12.5 mg via ORAL
  Filled 2018-03-02 (×4): qty 1

## 2018-03-02 MED ORDER — MAGNESIUM SULFATE 2 GM/50ML IV SOLN
2.0000 g | Freq: Once | INTRAVENOUS | Status: AC
  Administered 2018-03-02: 2 g via INTRAVENOUS
  Filled 2018-03-02: qty 50

## 2018-03-02 MED ORDER — METHYLPREDNISOLONE SODIUM SUCC 125 MG IJ SOLR
60.0000 mg | Freq: Two times a day (BID) | INTRAMUSCULAR | Status: DC
  Administered 2018-03-02 – 2018-03-04 (×4): 60 mg via INTRAVENOUS
  Filled 2018-03-02 (×4): qty 2

## 2018-03-02 MED ORDER — ENOXAPARIN SODIUM 40 MG/0.4ML ~~LOC~~ SOLN: 40.0000 mg | SUBCUTANEOUS | Status: DC

## 2018-03-02 MED ORDER — ALBUTEROL SULFATE (2.5 MG/3ML) 0.083% IN NEBU: 2.5000 mg | INHALATION_SOLUTION | RESPIRATORY_TRACT | Status: DC | PRN

## 2018-03-02 MED ORDER — LISINOPRIL-HYDROCHLOROTHIAZIDE 20-12.5 MG PO TABS: 1.0000 | ORAL_TABLET | Freq: Two times a day (BID) | ORAL | Status: DC

## 2018-03-02 MED ORDER — CLONAZEPAM 0.5 MG PO TABS: 0.5000 mg | ORAL_TABLET | Freq: Two times a day (BID) | ORAL | Status: DC | PRN

## 2018-03-02 MED ORDER — IPRATROPIUM-ALBUTEROL 0.5-2.5 (3) MG/3ML IN SOLN
3.0000 mL | Freq: Four times a day (QID) | RESPIRATORY_TRACT | Status: DC
  Administered 2018-03-02 – 2018-03-03 (×7): 3 mL via RESPIRATORY_TRACT
  Filled 2018-03-02 (×6): qty 3

## 2018-03-02 MED ORDER — LISINOPRIL 20 MG PO TABS
20.0000 mg | ORAL_TABLET | Freq: Two times a day (BID) | ORAL | Status: DC
  Administered 2018-03-02 – 2018-03-04 (×4): 20 mg via ORAL
  Filled 2018-03-02 (×4): qty 1

## 2018-03-02 NOTE — H&P (Signed)
[facility] [place] NAME: Paula Klein    MR#:  [id number]  DATE OF BIRTH:  06/14/1945  DATE OF ADMISSION:  03/02/2018  PRIMARY CARE PHYSICIAN: Marguerita Merles, MD   REQUESTING/REFERRING PHYSICIAN: Dr. Cinda Quest  CHIEF COMPLAINT:   Chief Complaint  Patient presents with  . Respiratory Distress    HISTORY OF PRESENT ILLNESS:  Paula Klein  is a 72 y.o. female with a known history of hypertension, recurrent bronchitis, remote tobacco use presents to the emergency room complaining of cough and shortness of breath for 3 days.  She started wheezing overnight and presented to the emergency room.  Here patient had significant hypoxia, tachypnea and placed on BiPAP.  Some improvement with Solu-Medrol and multiple nebulizers.  Presently off BiPAP but continues to need 2 L oxygen.  Continues to have significant wheezing.  Will be admitted inpatient to the hospital. Afebrile.  Flu test negative.  Chest x-ray shows 4 mm right lower lobe nodule.  No infiltrates.  PAST MEDICAL HISTORY:   Past Medical History:  Diagnosis Date  . H/O blood clots   . Hypertension     PAST SURGICAL HISTORY:   Past Surgical History:  Procedure Laterality Date  . COLONOSCOPY WITH PROPOFOL N/A 09/26/2015   Procedure: COLONOSCOPY WITH PROPOFOL;  Surgeon: Lollie Sails, MD;  Location: [facility] ENDOSCOPY;  Service: Endoscopy;  Laterality: N/A;  . NO PAST SURGERIES      SOCIAL HISTORY:   Social History   Tobacco Use  . Smoking status: Former Research scientist (life sciences)  . Smokeless tobacco: Never Used  Substance Use Topics  . Alcohol use: No    FAMILY HISTORY:   Family History  Problem Relation Age of Onset  . Varicose Veins Mother   . Heart attack Father   . Cancer Brother     DRUG ALLERGIES:   Allergies  Allergen Reactions  . Pine Tar Cough  . Sulfa Antibiotics Rash  . Tetracyclines & Related Rash    REVIEW OF SYSTEMS:   Review of Systems  Constitutional: Positive  for malaise/fatigue. Negative for chills, fever and weight loss.  HENT: Negative for hearing loss and nosebleeds.   Eyes: Negative for blurred vision, double vision and pain.  Respiratory: Positive for cough, shortness of breath and wheezing. Negative for hemoptysis and sputum production.   Cardiovascular: Negative for chest pain, palpitations, orthopnea and leg swelling.  Gastrointestinal: Negative for abdominal pain, constipation, diarrhea, nausea and vomiting.  Genitourinary: Negative for dysuria and hematuria.  Musculoskeletal: Negative for back pain, falls and myalgias.  Skin: Negative for rash.  Neurological: Negative for dizziness, tremors, sensory change, speech change, focal weakness, seizures and headaches.  Endo/Heme/Allergies: Does not bruise/bleed easily.  Psychiatric/Behavioral: Negative for depression and memory loss. The patient is not nervous/anxious.     MEDICATIONS AT HOME:   Prior to Admission medications   Medication Sig Start Date End Date Taking? Authorizing Provider  albuterol (PROVENTIL HFA;VENTOLIN HFA) 108 (90 Base) MCG/ACT inhaler Inhale 2 puffs into the lungs every 6 (six) hours as needed for shortness of breath.   Yes [provider]  Ascorbic Acid (VITAMIN C) 1000 MG tablet Take 1,000 mg by mouth daily.    Yes [provider]  atenolol (TENORMIN) 25 MG tablet Take 25 mg by mouth daily.   Yes [provider]  calcium carbonate (OS-CAL - DOSED IN MG OF ELEMENTAL CALCIUM) 1250 (500 Ca) MG tablet Take 1 tablet by mouth daily.   Yes [provider]  Cholecalciferol (VITAMIN D3 PO) Take 500 Units by mouth daily.   Yes [provider]  clonazePAM (KLONOPIN) 0.5 MG tablet Take 0.5 mg by mouth daily as needed for anxiety.   Yes [provider]  ELIQUIS 5 MG TABS tablet Take 5 mg by mouth 2 (two) times daily.  08/07/17  Yes [provider]  estradiol (ESTRACE) 0.5 MG tablet Take 0.5 mg by mouth daily.   Yes  [provider]  glucosamine-chondroitin 500-400 MG tablet Take 1 tablet by mouth 2 (two) times daily.    Yes [provider]  lisinopril-hydrochlorothiazide (PRINZIDE,ZESTORETIC) 20-12.5 MG tablet Take 1 tablet by mouth 2 (two) times daily.    Yes [provider]  loratadine (CLARITIN) 10 MG tablet Take 10 mg by mouth daily.   Yes [provider]  magnesium oxide (MAG-OX) 400 MG tablet Take 400 mg by mouth daily.    Yes [provider]  medroxyPROGESTERone (PROVERA) 2.5 MG tablet Take 2.5 mg by mouth daily.   Yes [provider]  Omega-3 Fatty Acids (FISH OIL PO) Take 1,200 mg by mouth daily.    Yes [provider]  vitamin E 1000 UNIT capsule Take 1,000 Units by mouth daily.   Yes [provider]  ibuprofen (ADVIL,MOTRIN) 600 MG tablet Take 1 tablet (600 mg total) by mouth every 8 (eight) hours as needed. Patient not taking: Reported on 03/02/2018 05/10/16   Darel Hong, MD     VITAL SIGNS:  Blood pressure 133/68, pulse 81, temperature 98 F (36.7 C), temperature source Axillary, resp. rate 20, height 5\' 1"  (1.549 m), weight 70.2 kg, SpO2 94 %.  PHYSICAL EXAMINATION:  Physical Exam  GENERAL:  73 y.o.-year-old patient lying in the bed with conversational dyspnea EYES: Pupils equal, round, reactive to light and accommodation. No scleral icterus. Extraocular muscles intact.  HEENT: Head atraumatic, normocephalic. Oropharynx and nasopharynx clear. No oropharyngeal erythema, moist oral mucosa  NECK:  Supple, no jugular venous distention. No thyroid enlargement, no tenderness.  LUNGS: Increased work of breathing.  Bilateral wheezing.  Decreased air entry CARDIOVASCULAR: S1, S2 normal. No murmurs, rubs, or gallops.  ABDOMEN: Soft, nontender, nondistended. Bowel sounds present. No organomegaly or mass.  EXTREMITIES: No pedal edema, cyanosis, or clubbing. + 2 pedal & radial pulses b/l.   NEUROLOGIC: Cranial nerves II  through XII are intact. No focal Motor or sensory deficits appreciated b/l PSYCHIATRIC: The patient is alert and oriented x 3. Good affect.  SKIN: No obvious rash, lesion, or ulcer.   LABORATORY PANEL:   CBC Recent Labs  Lab 03/02/18 0510  WBC 9.2  HGB 11.6*  HCT 36.2  PLT 359   ------------------------------------------------------------------------------------------------------------------  Chemistries  Recent Labs  Lab 03/02/18 0510  NA 137  K 3.2*  CL 101  CO2 27  GLUCOSE 108*  BUN 14  CREATININE 0.74  CALCIUM 9.0  MG 2.0  AST 25  ALT 18  ALKPHOS 65  BILITOT 0.7   ------------------------------------------------------------------------------------------------------------------  Cardiac Enzymes Recent Labs  Lab 03/02/18 0510  TROPONINI <0.03   ------------------------------------------------------------------------------------------------------------------  RADIOLOGY:  Dg Chest Portable 1 View  Result Date: 03/02/2018 CLINICAL DATA:  Acute onset of shortness of breath. EXAM: PORTABLE CHEST 1 VIEW COMPARISON:  Chest radiograph performed 07/09/2017 FINDINGS: The lungs are well-aerated. Mild bibasilar atelectasis is noted. A 4 mm nodule is suggested at the right lung base. There is no evidence of pleural effusion or pneumothorax. The cardiomediastinal silhouette is borderline normal in size. No acute  osseous abnormalities are seen. IMPRESSION: 1. Mild bibasilar atelectasis noted; lungs otherwise clear. 2. 4 mm nodule suggested at the right lung base. CT of the chest could be considered for further evaluation on an elective nonemergent basis. Electronically Signed   By: Garald Balding M.D.   On: 03/02/2018 05:30     IMPRESSION AND PLAN:   *Acute bronchitis with possible underlying COPD and acute hypoxic respiratory failure Was able to come off BiPAP but continues to have shortness of breath and wheezing.  Still needs 2 L oxygen to keep saturations over  88%. Admit to medical floor -IV steroids - Scheduled Nebulizers - Inhalers -Wean O2 as tolerated - Consult pulmonary if no improvement Will need pulmonary function test as outpatient and pulmonary follow-up  *Right lung base 4 mm nodule.  Will need CT scan of the chest as outpatient for further evaluation.  *Hypokalemia.  Will replace orally.  *Hypertension.  Continue home medications.  *DVT prophylaxis with Lovenox   All the records are reviewed and case discussed with ED provider. Management plans discussed with the patient, family and they are in agreement.  CODE STATUS: DNR  TOTAL TIME TAKING CARE OF THIS PATIENT: 40 minutes.   Leia Alf Takyla Kuchera M.D on 03/02/2018 at 8:53 AM  Between 7am to 6pm - Pager - [phone]  After 6pm go to [email or web address] - password [email or web address] [facility]  [facility] [place] [facility]  Office  [phone]  CC: Primary care physician; Marguerita Merles, MD  Note: This dictation was prepared with Dragon dictation along with smaller phrase technology. Any transcriptional errors that result from this process are unintentional.

## 2018-03-02 NOTE — ED Notes (Signed)
Tolerating 4L/ [place] well.  Respirations regular and non labored.  NAD

## 2018-03-02 NOTE — ED Notes (Signed)
Decrease oxygen to 3L/[place].  Continues to tolerate nasal cannula.   Continue to monitor.

## 2018-03-02 NOTE — Progress Notes (Signed)
Advance care planning  Purpose of Encounter Acute bronchitis.  CODE STATUS discussion  Parties in Attendance Patient  Patients Decisional capacity Alert and oriented.  Able to make medical decisions.  Has documented healthcare power of attorney Reuel Boom  Discussed regarding acute bronchitis, respiratory failure and need for admission.  Prognosis and treatment plan discussed.  All questions answered.  CODE STATUS discussed.  Patient requests to be a DO NOT RESUSCITATE DO NOT INTUBATE.  DNR/DNI orders entered.  CODE STATUS changed  Time spent - 17 minutes

## 2018-03-02 NOTE — ED Notes (Signed)
AAOx3.  Skin warm and dry.  NAD 

## 2018-03-02 NOTE — ED Provider Notes (Signed)
[facility] Emergency Department Provider Note  ____________________________________________   First MD Initiated Contact with Patient 03/02/18 [phone]     (approximate)  I have reviewed the triage vital signs and the nursing notes.   HISTORY  Chief Complaint Respiratory Distress  Level 5 caveat:  history/ROS limited by acute/critical illness  HPI Paula Klein is a 72 y.o. female with a past tobacco history and questionable COPD history but no official diagnosis.  She presents by private vehicle with her brother for evaluation of gradually worsening shortness of breath for 4 days which is now become severe.  She is not able to provide much history and mostly can answer questions with a few words at a time and nodding or shaking her head.  Her brother reports that she called him tonight and could not speak on the phone so he came over once and found her breathing with great effort and difficulty.  He does comment, however, that he has seen her as bad in the past.  She reports never having been intubated.  She says that the symptoms have been worsening over 4 days and nothing in particular is making them better including her home breathing treatments.  She does not use oxygen at baseline.  Nothing in particular other than exertion makes her symptoms worse and nothing is making them better.  She is having some chest discomfort as well but reports that this started today, whereas the shortness of breath is been going on for about 4 days.  Past Medical History:  Diagnosis Date  . H/O blood clots   . Hypertension     Patient Active Problem List   Diagnosis Date Noted  . Varicose veins of both lower extremities with inflammation 08/31/2017  . Chronic venous insufficiency 08/31/2017  . Swelling of limb 08/31/2017    Past Surgical History:  Procedure Laterality Date  . COLONOSCOPY WITH PROPOFOL N/A 09/26/2015   Procedure: COLONOSCOPY WITH PROPOFOL;  Surgeon: Lollie Sails, MD;  Location: [facility] ENDOSCOPY;  Service: Endoscopy;  Laterality: N/A;  . NO PAST SURGERIES      Prior to Admission medications   Medication Sig Start Date End Date Taking? Authorizing Provider  ALPRAZolam (XANAX) 0.5 MG tablet TAKE FIRST TABLET BY MOUTH ONE HOUR PRIOR TO PROCEDURE. TAKE SECOND TABLET UPON ARRIVAL TO THE CLINIC. 12/02/17   [provider]  aspirin EC 81 MG tablet Take 81 mg by mouth daily.    [provider]  atenolol (TENORMIN) 25 MG tablet Take 25 mg by mouth daily.    [provider]  calcium citrate-vitamin D (CITRACAL+D) 315-200 MG-UNIT tablet Take 1 tablet by mouth 2 (two) times daily.    [provider]  clonazePAM (KLONOPIN) 0.5 MG tablet Take 0.5 mg by mouth daily as needed for anxiety.    [provider]  ELIQUIS 5 MG TABS tablet Take 5 mg by mouth 2 (two) times daily.  08/07/17   [provider]  estradiol (ESTRACE) 0.5 MG tablet Take 0.5 mg by mouth daily.    [provider]  glucosamine-chondroitin 500-400 MG tablet Take 1 tablet by mouth 3 (three) times daily.    [provider]  ibuprofen (ADVIL,MOTRIN) 600 MG tablet Take 1 tablet (600 mg total) by mouth every 8 (eight) hours as needed. 05/10/16   Darel Hong, MD  lisinopril-hydrochlorothiazide (PRINZIDE,ZESTORETIC) 20-12.5 MG tablet Take 1 tablet by mouth daily.    [provider]  loratadine (CLARITIN) 10 MG tablet Take  10 mg by mouth daily.    [provider]  magnesium oxide (MAG-OX) 400 MG tablet Take 800 mg by mouth daily.    [provider]  medroxyPROGESTERone (PROVERA) 2.5 MG tablet Take 2.5 mg by mouth daily.    [provider]  Multiple Vitamin (MULTIVITAMIN) capsule Take 1 capsule by mouth daily.    [provider]  Omega-3 Fatty Acids (FISH OIL PO) Take by mouth.    [provider]  vitamin C (ASCORBIC ACID) 500 MG tablet Take 1,500 mg by mouth daily.    [provider]  vitamin E 1000 UNIT capsule Take 1,000 Units by mouth daily.    [provider]    Allergies Pine tar; Sulfa antibiotics; and Tetracyclines & related  Family History  Problem Relation Age of Onset  . Varicose Veins Mother   . Heart attack Father   . Cancer Brother     Social History Social History   Tobacco Use  . Smoking status: Former Research scientist (life sciences)  . Smokeless tobacco: Never Used  Substance Use Topics  . Alcohol use: No  . Drug use: No    Review of Systems Level 5 caveat:  history/ROS limited by acute/critical illness.  Patient reports severe shortness of breath for 4 days and some chest pain that started today. ____________________________________________   PHYSICAL EXAM:  VITAL SIGNS: ED Triage Vitals  Enc Vitals Group     BP 03/02/18 0504 (!) 156/69     Pulse Rate 03/02/18 0504 75     Resp 03/02/18 0504 18     Temp 03/02/18 0513 98 F (36.7 C)     Temp Source 03/02/18 0513 Axillary     SpO2 03/02/18 0504 90 %     Weight 03/02/18 0501 70.2 kg (154 lb 12.2 oz)     Height 03/02/18 0501 1.549 m (5\' 1" )     Head Circumference --      Peak Flow --      Pain Score --      Pain Loc --      Pain Edu? --      Excl. in [place]? --     Constitutional: Alert and oriented.  Severe respiratory difficulty with accessory muscle usage and retractions and decreased air movement throughout. Eyes: Conjunctivae are normal.  Head: Atraumatic. Nose: No congestion/rhinnorhea. Mouth/Throat: Mucous membranes are moist. Neck: No stridor.  No meningeal signs.   Cardiovascular: Normal rate, regular rhythm. Good peripheral circulation. Grossly normal heart sounds. Respiratory: Severely increased respiratory effort with accessory muscle usage and intercostal retractions.  Decreased air movement throughout with severe wheezing. Gastrointestinal: Soft and nontender. No distention.  Musculoskeletal: No lower extremity tenderness nor edema. No gross deformities of  extremities. Neurologic:  Normal speech and language. No gross focal neurologic deficits are appreciated.  Skin:  Skin is warm, dry and intact. No rash noted. Psychiatric: Mood and affect are normal. Speech and behavior are normal.  ____________________________________________   LABS (all labs ordered are listed, but only abnormal results are displayed)  Labs Reviewed  CBC WITH DIFFERENTIAL/PLATELET - Abnormal; Notable for the following components:      Result Value   Hemoglobin 11.6 (*)    MCH 25.7 (*)    Eosinophils Absolute 0.6 (*)    All other components within normal limits  COMPREHENSIVE METABOLIC PANEL - Abnormal; Notable for the following components:   Potassium 3.2 (*)    Glucose, Bld 108 (*)    All other components within normal limits  BRAIN NATRIURETIC PEPTIDE - Abnormal; Notable for the following components:   B Natriuretic Peptide 141.0 (*)    All other components within normal limits  BLOOD GAS, VENOUS - Abnormal; Notable for the following components:   pH, Ven 7.45 (*)    pCO2, Ven 42 (*)    pO2, Ven 99.0 (*)    Bicarbonate 29.2 (*)    Acid-Base Excess 4.7 (*)    All other components within normal limits  TROPONIN I  MAGNESIUM   ____________________________________________  EKG  ED ECG REPORT I, Hinda Kehr, the attending physician, personally viewed and interpreted this ECG.  Date: 03/02/2018 EKG Time: 5:15 AM Rate: 79 Rhythm: normal sinus rhythm QRS Axis: normal Intervals: Prolonged QTC at 588 ms, otherwise unremarkable ST/T Wave abnormalities: normal Narrative Interpretation: no evidence of acute ischemia  ____________________________________________  RADIOLOGY I, Hinda Kehr, personally viewed and evaluated these images (plain radiographs) as part of my medical decision making, as well as reviewing the written report by the radiologist.  ED MD interpretation: No evidence of acute pneumonia  Official radiology report(s): Dg Chest Portable  1 View  Result Date: 03/02/2018 CLINICAL DATA:  Acute onset of shortness of breath. EXAM: PORTABLE CHEST 1 VIEW COMPARISON:  Chest radiograph performed 07/09/2017 FINDINGS: The lungs are well-aerated. Mild bibasilar atelectasis is noted. A 4 mm nodule is suggested at the right lung base. There is no evidence of pleural effusion or pneumothorax. The cardiomediastinal silhouette is borderline normal in size. No acute osseous abnormalities are seen. IMPRESSION: 1. Mild bibasilar atelectasis noted; lungs otherwise clear. 2. 4 mm nodule suggested at the right lung base. CT of the chest could be considered for further evaluation on an elective nonemergent basis. Electronically Signed   By: Garald Balding M.D.   On: 03/02/2018 05:30    ____________________________________________   PROCEDURES  Critical Care performed: Yes, see critical care procedure note(s)   Procedure(s) performed:   .Critical Care Performed by: Hinda Kehr, MD Authorized by: Hinda Kehr, MD   Critical care provider statement:    Critical care time (minutes):  30   Critical care time was exclusive of:  Separately billable procedures and treating other patients   Critical care was necessary to treat or prevent imminent or life-threatening deterioration of the following conditions:  Respiratory failure   Critical care was time spent personally by me on the following activities:  Development of treatment plan with patient or surrogate, discussions with consultants, evaluation of patient's response to treatment, examination of patient, obtaining history from patient or surrogate, ordering and performing treatments and interventions, ordering and review of laboratory studies, ordering and review of radiographic studies, pulse oximetry, re-evaluation of patient's condition and review of old charts     ____________________________________________   INITIAL IMPRESSION / [place] / ED COURSE  As part of my medical  decision making, I reviewed the following data within the electronic MEDICAL RECORD NUMBER History obtained from family, Nursing notes reviewed and incorporated, Labs reviewed , EKG interpreted , Old chart reviewed, Radiograph reviewed , Discussed with admitting physician  and Notes from prior ED visits    Differential diagnosis includes, but is not limited to, COPD exacerbation, pneumonia, ACS, PE, pneumothorax.  Based on presentation, anticipate COPD exacerbation in spite of no specific previous diagnosis.  Based on the severe distress, I will initiate BiPAP immediately and perform a standard work-up.  Solu-Medrol 125 mg IV, DuoNeb x3, magnesium 2 g IV.  Imaging and lab work are pending.  No indication  for intubation immediately, but if the patient does not improve on BiPAP it may be necessary.  Clinical Course as of Mar 02 554  Mon Mar 02, 2018  [place] with Respiratory Therapy to initiate bipap   [CF]  7121 Discussed case with hospitalist who will admit.  Her CBC is reassuring with no leukocytosis and her venous blood gas is actually better appearing than anticipated which is reassuring.  She needs continued BiPAP and regular breathing treatments which is the reason for admission to stepdown.   [CF]  0550 Reassuring comprehensive metabolic panel with just slightly decreased potassium likely the result of albuterol.  Troponin is negative.  Comprehensive metabolic panel(!) [CF]  9758 Patient breathing much more comfortably on the BiPAP, able to speak in complete sentences.   [CF]    Clinical Course User Index [CF] Hinda Kehr, MD    ____________________________________________  FINAL CLINICAL IMPRESSION(S) / ED DIAGNOSES  Final diagnoses:  Acute respiratory failure with hypoxemia ([place])  COPD exacerbation ([place])     MEDICATIONS GIVEN DURING THIS VISIT:  Medications  methylPREDNISolone sodium succinate (SOLU-MEDROL) 125 mg/2 mL injection 125 mg (125 mg Intravenous Given  03/02/18 0507)  ipratropium-albuterol (DUONEB) 0.5-2.5 (3) MG/3ML nebulizer solution 3 mL (3 mLs Nebulization Given 03/02/18 0502)  ipratropium-albuterol (DUONEB) 0.5-2.5 (3) MG/3ML nebulizer solution 3 mL (3 mLs Nebulization Given 03/02/18 0502)  ipratropium-albuterol (DUONEB) 0.5-2.5 (3) MG/3ML nebulizer solution 3 mL (3 mLs Nebulization Given 03/02/18 0502)  magnesium sulfate IVPB 2 g 50 mL (0 g Intravenous Stopped 03/02/18 0537)     ED Discharge Orders    None       Note:  This document was prepared using Dragon voice recognition software and may include unintentional dictation errors.    Hinda Kehr, MD 03/02/18 [phone]

## 2018-03-02 NOTE — ED Notes (Signed)
BiPap discontinued by RRT.  Patient placed on 4l/ [place].

## 2018-03-02 NOTE — ED Notes (Signed)
ED TO INPATIENT HANDOFF REPORT  ED Nurse Name and Phone #: Opal Sidles  [phone]  Name/Age/Gender Paula Klein 72 y.o. female Room/Bed: ED03A/ED03A  Code Status   Code Status: DNR  Home/SNF/Other Home Patient oriented to: self Is this baseline? Yes   Triage Complete: Triage complete  Chief Complaint Respiratory distress  Triage Note Patient assisted out of car in front of ED and taken straight to treatment room.  MD at bedside.  Patient reports short of breath for 4 days.   Allergies Allergies  Allergen Reactions  . Pine Tar Cough  . Sulfa Antibiotics Rash  . Tetracyclines & Related Rash    Level of Care/Admitting Diagnosis ED Disposition    ED Disposition Condition [facility] Area: [place] [[id number]]  Level of Care: Med-Surg [16]  Diagnosis: Acute bronchitis [466.0.ICD-9-CM]  Admitting Physician: Hillary Bow [[id number]]  Attending Physician: Hillary Bow [[id number]]  Estimated length of stay: past midnight tomorrow  Certification:: I certify this patient will need inpatient services for at least 2 midnights  PT Class (Do Not Modify): Inpatient [101]  PT Acc Code (Do Not Modify): Private [1]       Medical/Surgery History Past Medical History:  Diagnosis Date  . H/O blood clots   . Hypertension    Past Surgical History:  Procedure Laterality Date  . COLONOSCOPY WITH PROPOFOL N/A 09/26/2015   Procedure: COLONOSCOPY WITH PROPOFOL;  Surgeon: Lollie Sails, MD;  Location: [facility] ENDOSCOPY;  Service: Endoscopy;  Laterality: N/A;  . NO PAST SURGERIES       IV Location/Drains/Wounds Patient Lines/Drains/Airways Status   Active Line/Drains/Airways    Name:   Placement date:   Placement time:   Site:   Days:   Peripheral IV 03/02/18 Left Antecubital   03/02/18    0500    Antecubital   less than 1          Intake/Output Last 24 hours No intake or output data in the 24 hours ending 03/02/18 0912  Labs/Imaging Results for  orders placed or performed during the hospital encounter of 03/02/18 (from the past 48 hour(s))  CBC with Differential/Platelet     Status: Abnormal   Collection Time: 03/02/18  5:10 AM  Result Value Ref Range   WBC 9.2 4.0 - 10.5 K/uL   RBC 4.52 3.87 - 5.11 MIL/uL   Hemoglobin 11.6 (L) 12.0 - 15.0 g/dL   HCT 36.2 36.0 - 46.0 %   MCV 80.1 80.0 - 100.0 fL   MCH 25.7 (L) 26.0 - 34.0 pg   MCHC 32.0 30.0 - 36.0 g/dL   RDW 13.6 11.5 - 15.5 %   Platelets 359 150 - 400 K/uL   nRBC 0.0 0.0 - 0.2 %   Neutrophils Relative % 67 %   Neutro Abs 6.2 1.7 - 7.7 K/uL   Lymphocytes Relative 16 %   Lymphs Abs 1.5 0.7 - 4.0 K/uL   Monocytes Relative 9 %   Monocytes Absolute 0.8 0.1 - 1.0 K/uL   Eosinophils Relative 7 %   Eosinophils Absolute 0.6 (H) 0.0 - 0.5 K/uL   Basophils Relative 1 %   Basophils Absolute 0.1 0.0 - 0.1 K/uL   Immature Granulocytes 0 %   Abs Immature Granulocytes 0.04 0.00 - 0.07 K/uL    Comment: Performed at [facility], [address] [place], [place] [address]  Troponin I - ONCE - STAT     Status: None   Collection Time: 03/02/18  5:10 AM  Result Value Ref Range   Troponin I <0.03 <0.03 ng/mL    Comment: Performed at [facility], [address] [place], [place] [address]  Comprehensive metabolic panel     Status: Abnormal   Collection Time: 03/02/18  5:10 AM  Result Value Ref Range   Sodium 137 135 - 145 mmol/L   Potassium 3.2 (L) 3.5 - 5.1 mmol/L   Chloride 101 98 - 111 mmol/L   CO2 27 22 - 32 mmol/L   Glucose, Bld 108 (H) 70 - 99 mg/dL   BUN 14 8 - 23 mg/dL   Creatinine, Ser 0.74 0.44 - 1.00 mg/dL   Calcium 9.0 8.9 - 10.3 mg/dL   Total Protein 7.3 6.5 - 8.1 g/dL   Albumin 3.8 3.5 - 5.0 g/dL   AST 25 15 - 41 U/L   ALT 18 0 - 44 U/L   Alkaline Phosphatase 65 38 - 126 U/L   Total Bilirubin 0.7 0.3 - 1.2 mg/dL   GFR calc non Af Amer >60 >60 mL/min   GFR calc Af Amer >60 >60 mL/min   Anion gap 9 5 - 15    Comment: Performed at [facility], [address], [place], [place] [address]  Brain natriuretic peptide     Status: Abnormal   Collection Time: 03/02/18  5:10 AM  Result Value Ref Range   B Natriuretic Peptide 141.0 (H) 0.0 - 100.0 pg/mL    Comment: Performed at [facility], [address], [place], [place] [address]  Magnesium     Status: None   Collection Time: 03/02/18  5:10 AM  Result Value Ref Range   Magnesium 2.0 1.7 - 2.4 mg/dL    Comment: Performed at [facility], [address] [place], [place] [address]  Blood gas, venous     Status: Abnormal   Collection Time: 03/02/18  5:10 AM  Result Value Ref Range   FIO2 10LITERS    Delivery systems NEBULIZER    pH, Ven 7.45 (H) 7.250 - 7.430   pCO2, Ven 42 (L) 44.0 - 60.0 mmHg   pO2, Ven 99.0 (H) 32.0 - 45.0 mmHg   Bicarbonate 29.2 (H) 20.0 - 28.0 mmol/L   Acid-Base Excess 4.7 (H) 0.0 - 2.0 mmol/L   O2 Saturation 98.0 %   Patient temperature 37.0    Collection site LINE    Sample type VENOUS     Comment: Performed at [facility], [address], [place], [place] [address]   Dg Chest Portable 1 View  Result Date: 03/02/2018 CLINICAL DATA:  Acute onset of shortness of breath. EXAM: PORTABLE CHEST 1 VIEW COMPARISON:  Chest radiograph performed 07/09/2017 FINDINGS: The lungs are well-aerated. Mild bibasilar atelectasis is noted. A 4 mm nodule is suggested at the right lung base. There is no evidence of pleural effusion or pneumothorax. The cardiomediastinal silhouette is borderline normal in size. No acute osseous abnormalities are seen. IMPRESSION: 1. Mild bibasilar atelectasis noted; lungs otherwise clear. 2. 4 mm nodule suggested at the right lung base. CT of the chest could be considered for further evaluation on an elective nonemergent basis. Electronically Signed   By: Garald Balding M.D.   On: 03/02/2018 05:30    Pending Labs Unresulted Labs (From admission, onward)    Start     Ordered   03/03/18 9924  Basic  metabolic panel  Tomorrow morning,   STAT     03/02/18 0852   03/03/18 0500  CBC  Tomorrow morning,   STAT     03/02/18 0852          Vitals/Pain Today's Vitals   03/02/18 0800 03/02/18 0825 03/02/18 0830 03/02/18 0838  BP: 135/71  133/68   Pulse: 74  81   Resp: 18  20   Temp:      TempSrc:      SpO2: 98%  94%   Weight:      Height:      PainSc:  0-No pain  0-No pain    Isolation Precautions No active isolations  Medications Medications  acetaminophen (TYLENOL) tablet 650 mg (has no administration in time range)    Or  acetaminophen (TYLENOL) suppository 650 mg (has no administration in time range)  polyethylene glycol (MIRALAX / GLYCOLAX) packet 17 g (has no administration in time range)  ondansetron (ZOFRAN) tablet 4 mg (has no administration in time range)    Or  ondansetron (ZOFRAN) injection 4 mg (has no administration in time range)  albuterol (PROVENTIL) (2.5 MG/3ML) 0.083% nebulizer solution 2.5 mg (has no administration in time range)  methylPREDNISolone sodium succinate (SOLU-MEDROL) 125 mg/2 mL injection 60 mg (has no administration in time range)  ipratropium-albuterol (DUONEB) 0.5-2.5 (3) MG/3ML nebulizer solution 3 mL (has no administration in time range)  methylPREDNISolone sodium succinate (SOLU-MEDROL) 125 mg/2 mL injection 125 mg (125 mg Intravenous Given 03/02/18 0507)  ipratropium-albuterol (DUONEB) 0.5-2.5 (3) MG/3ML nebulizer solution 3 mL (3 mLs Nebulization Given 03/02/18 0502)  ipratropium-albuterol (DUONEB) 0.5-2.5 (3) MG/3ML nebulizer solution 3 mL (3 mLs Nebulization Given 03/02/18 0502)  ipratropium-albuterol (DUONEB) 0.5-2.5 (3) MG/3ML nebulizer solution 3 mL (3 mLs Nebulization Given 03/02/18 0502)  magnesium sulfate IVPB 2 g 50 mL (0 g Intravenous Stopped 03/02/18 0537)    Mobility walks Low fall risk   Focused Assessments Pulmonary Assessment Handoff:  Lung sounds: Bilateral Breath Sounds: Clear L Breath Sounds: Clear R Breath  Sounds: Clear O2 Device: Bi-PAP        Recommendations: See Admitting Provider Note  Report given to:   Additional Notes:

## 2018-03-02 NOTE — ED Triage Notes (Signed)
Patient assisted out of car in front of ED and taken straight to treatment room.  MD at bedside.  Patient reports short of breath for 4 days.

## 2018-03-02 NOTE — ED Notes (Signed)
Ambulated to toilet.  Tolerated well.

## 2018-03-03 LAB — CBC
HCT: 35.2 % — ABNORMAL LOW (ref 36.0–46.0)
Hemoglobin: 11.1 g/dL — ABNORMAL LOW (ref 12.0–15.0)
MCH: 25.3 pg — ABNORMAL LOW (ref 26.0–34.0)
MCHC: 31.5 g/dL (ref 30.0–36.0)
MCV: 80.4 fL (ref 80.0–100.0)
PLATELETS: 390 10*3/uL (ref 150–400)
RBC: 4.38 MIL/uL (ref 3.87–5.11)
RDW: 13.9 % (ref 11.5–15.5)
WBC: 16.2 10*3/uL — ABNORMAL HIGH (ref 4.0–10.5)
nRBC: 0 % (ref 0.0–0.2)

## 2018-03-03 LAB — BASIC METABOLIC PANEL
Anion gap: 10 (ref 5–15)
BUN: 15 mg/dL (ref 8–23)
CO2: 26 mmol/L (ref 22–32)
Calcium: 9.2 mg/dL (ref 8.9–10.3)
Chloride: 100 mmol/L (ref 98–111)
Creatinine, Ser: 0.61 mg/dL (ref 0.44–1.00)
GFR calc Af Amer: >60 mL/min (ref >60)
GFR calc non Af Amer: >60 mL/min (ref >60)
Glucose, Bld: 147 mg/dL — ABNORMAL HIGH (ref 70–99)
Potassium: 3.2 mmol/L — ABNORMAL LOW (ref 3.5–5.1)
Sodium: 136 mmol/L (ref 135–145)

## 2018-03-03 MED ORDER — POTASSIUM CHLORIDE CRYS ER 20 MEQ PO TBCR
40.0000 meq | EXTENDED_RELEASE_TABLET | Freq: Once | ORAL | Status: AC
  Administered 2018-03-03: 40 meq via ORAL
  Filled 2018-03-03: qty 2

## 2018-03-03 NOTE — Progress Notes (Signed)
[place] at [place] NAME: Illeana Edick    MR#:  [id number]  DATE OF BIRTH:  [birth year]  SUBJECTIVE:  CHIEF COMPLAINT:   Chief Complaint  Patient presents with  . Respiratory Distress   Still has SOB. Significant while walking REVIEW OF SYSTEMS:    Review of Systems  Constitutional: Positive for malaise/fatigue. Negative for chills and fever.  HENT: Negative for sore throat.   Eyes: Negative for blurred vision, double vision and pain.  Respiratory: Positive for cough, shortness of breath and wheezing. Negative for hemoptysis.   Cardiovascular: Negative for chest pain, palpitations, orthopnea and leg swelling.  Gastrointestinal: Negative for abdominal pain, constipation, diarrhea, heartburn, nausea and vomiting.  Genitourinary: Negative for dysuria and hematuria.  Musculoskeletal: Negative for back pain and joint pain.  Skin: Negative for rash.  Neurological: Negative for sensory change, speech change, focal weakness and headaches.  Endo/Heme/Allergies: Does not bruise/bleed easily.  Psychiatric/Behavioral: Negative for depression. The patient is not nervous/anxious.     DRUG ALLERGIES:   Allergies  Allergen Reactions  . Pine Tar Cough  . Sulfa Antibiotics Rash  . Tetracyclines & Related Rash    VITALS:  Blood pressure 120/70, pulse 95, temperature 97.7 F (36.5 C), temperature source Oral, resp. rate 18, height 5\' 1"  (1.549 m), weight 70.6 kg, SpO2 97 %.  PHYSICAL EXAMINATION:   Physical Exam  GENERAL:  72 y.o.-year-old patient lying in the bed with conversational dyspnea EYES: Pupils equal, round, reactive to light and accommodation. No scleral icterus. Extraocular muscles intact.  HEENT: Head atraumatic, normocephalic. Oropharynx and nasopharynx clear.  NECK:  Supple, no jugular venous distention. No thyroid enlargement, no tenderness.  LUNGS: Bilateral wheezing CARDIOVASCULAR: S1, S2 normal. No murmurs, rubs, or gallops.   ABDOMEN: Soft, nontender, nondistended. Bowel sounds present. No organomegaly or mass.  EXTREMITIES: No cyanosis, clubbing or edema b/l.    NEUROLOGIC: Cranial nerves II through XII are intact. No focal Motor or sensory deficits b/l.   PSYCHIATRIC: The patient is alert and oriented x 3.  SKIN: No obvious rash, lesion, or ulcer.   LABORATORY PANEL:   CBC Recent Labs  Lab 03/03/18 0421  WBC 16.2*  HGB 11.1*  HCT 35.2*  PLT 390   ------------------------------------------------------------------------------------------------------------------ Chemistries  Recent Labs  Lab 03/02/18 0510 03/03/18 0421  NA 137 136  K 3.2* 3.2*  CL 101 100  CO2 27 26  GLUCOSE 108* 147*  BUN 14 15  CREATININE 0.74 0.61  CALCIUM 9.0 9.2  MG 2.0  --   AST 25  --   ALT 18  --   ALKPHOS 65  --   BILITOT 0.7  --    ------------------------------------------------------------------------------------------------------------------  Cardiac Enzymes Recent Labs  Lab 03/02/18 0510  TROPONINI <0.03   ------------------------------------------------------------------------------------------------------------------  RADIOLOGY:  Dg Chest Portable 1 View  Result Date: 03/02/2018 CLINICAL DATA:  Acute onset of shortness of breath. EXAM: PORTABLE CHEST 1 VIEW COMPARISON:  Chest radiograph performed 07/09/2017 FINDINGS: The lungs are well-aerated. Mild bibasilar atelectasis is noted. A 4 mm nodule is suggested at the right lung base. There is no evidence of pleural effusion or pneumothorax. The cardiomediastinal silhouette is borderline normal in size. No acute osseous abnormalities are seen. IMPRESSION: 1. Mild bibasilar atelectasis noted; lungs otherwise clear. 2. 4 mm nodule suggested at the right lung base. CT of the chest could be considered for further evaluation on an elective nonemergent basis. Electronically Signed   By: Francoise Schaumann.D.  On: 03/02/2018 05:30     ASSESSMENT AND PLAN:    *Acute bronchitis with possible underlying COPD and acute hypoxic respiratory failure Off O2 today -IV steroids - Scheduled Nebulizers - Inhalers -Wean O2 as tolerated Still SOB  *Right lung base 4 mm nodule.  Will need CT scan of the chest as outpatient for further evaluation.  *Hypokalemia.  Will replace orally.  *Hypertension.  Continue home medications.  *DVT prophylaxis with Lovenox   All the records are reviewed and case discussed with Care Management/Social Worker Management plans discussed with the patient, family and they are in agreement.  CODE STATUS: DNR  DVT Prophylaxis: SCDs  TOTAL TIME TAKING CARE OF THIS PATIENT: 35 minutes.   Likely d/c tomorrow  Neita Carp M.D on 03/03/2018 at 1:59 PM  Between 7am to 6pm - Pager - [phone]  After 6pm go to [email or web address] - password [email or web address] [facility]  [facility]  Office  [phone]  CC: Primary care physician; Marguerita Merles, MD  Note: This dictation was prepared with Dragon dictation along with smaller phrase technology. Any transcriptional errors that result from this process are unintentional.

## 2018-03-04 MED ORDER — IPRATROPIUM-ALBUTEROL 0.5-2.5 (3) MG/3ML IN SOLN
3.0000 mL | Freq: Three times a day (TID) | RESPIRATORY_TRACT | Status: DC
  Administered 2018-03-04: 3 mL via RESPIRATORY_TRACT
  Filled 2018-03-04 (×2): qty 3

## 2018-03-04 MED ORDER — GUAIFENESIN-CODEINE 100-10 MG/5ML PO SOLN: 5.0000 mL | Freq: Three times a day (TID) | ORAL | 0 refills | Status: DC | PRN

## 2018-03-04 MED ORDER — PREDNISONE 10 MG (21) PO TBPK: ORAL_TABLET | ORAL | 0 refills | Status: DC

## 2018-03-16 NOTE — Discharge Summary (Signed)
[place] at [place] NAME: Paula Klein    MR#:  [id number]  DATE OF BIRTH:  05-22-1945  DATE OF ADMISSION:  03/02/2018 ADMITTING PHYSICIAN: Hillary Bow, MD  DATE OF DISCHARGE: 03/04/2018 12:38 PM  PRIMARY CARE PHYSICIAN: Marguerita Merles, MD   ADMISSION DIAGNOSIS:  COPD exacerbation ([place]) [J44.1] Acute respiratory failure with hypoxemia ([place]) [J96.01]  DISCHARGE DIAGNOSIS:  Active Problems:   Acute bronchitis   SECONDARY DIAGNOSIS:   Past Medical History:  Diagnosis Date  . H/O blood clots   . Hypertension      ADMITTING HISTORY  HISTORY OF PRESENT ILLNESS:  Paula Klein  is a 73 y.o. female with a known history of hypertension, recurrent bronchitis, remote tobacco use presents to the emergency room complaining of cough and shortness of breath for 3 days.  She started wheezing overnight and presented to the emergency room.  Here patient had significant hypoxia, tachypnea and placed on BiPAP.  Some improvement with Solu-Medrol and multiple nebulizers.  Presently off BiPAP but continues to need 2 L oxygen.  Continues to have significant wheezing.  Will be admitted inpatient to the hospital. Afebrile.  Flu test negative.  Chest x-ray shows 4 mm right lower lobe nodule.  No infiltrates.  HOSPITAL COURSE:   *Acute bronchitis  *Right lung base 4 mm nodule *Hypokalemia *Hypertension *Acute hypoxic respiratory failure  Patient was treated with IV steroids, nebulizers.  Chest x-ray did not show any infiltrates.  She had long smoking history and most likely has underlying COPD.  Outpatient pulmonary follow-up. Will need follow-up CT scan with her pulmonologist for right lung base nodule.  By day of discharge she has been weaned off oxygen.  Will be changed to oral prednisone taper at discharge.  Discharge home.  CONSULTS OBTAINED:    DRUG ALLERGIES:   Allergies  Allergen Reactions  . Pine Tar Cough  . Sulfa Antibiotics Rash  .  Tetracyclines & Related Rash    DISCHARGE MEDICATIONS:   Allergies as of 03/04/2018      Reactions   Pine Tar Cough   Sulfa Antibiotics Rash   Tetracyclines & Related Rash      Medication List    STOP taking these medications   ibuprofen 600 MG tablet Commonly known as:  ADVIL,MOTRIN     TAKE these medications   albuterol 108 (90 Base) MCG/ACT inhaler Commonly known as:  PROVENTIL HFA;VENTOLIN HFA Inhale 2 puffs into the lungs every 6 (six) hours as needed for shortness of breath.   atenolol 25 MG tablet Commonly known as:  TENORMIN Take 25 mg by mouth daily.   calcium carbonate 1250 (500 Ca) MG tablet Commonly known as:  OS-CAL - dosed in mg of elemental calcium Take 1 tablet by mouth daily.   clonazePAM 0.5 MG tablet Commonly known as:  KLONOPIN Take 0.5 mg by mouth daily as needed for anxiety.   ELIQUIS 5 MG Tabs tablet Generic drug:  apixaban Take 5 mg by mouth 2 (two) times daily.   estradiol 0.5 MG tablet Commonly known as:  ESTRACE Take 0.5 mg by mouth daily.   FISH OIL PO Take 1,200 mg by mouth daily.   glucosamine-chondroitin 500-400 MG tablet Take 1 tablet by mouth 2 (two) times daily.   guaiFENesin-codeine 100-10 MG/5ML syrup Take 5 mLs by mouth 3 (three) times daily as needed for cough.   lisinopril-hydrochlorothiazide 20-12.5 MG tablet Commonly known as:  PRINZIDE,ZESTORETIC Take 1 tablet by mouth 2 (two)  times daily.   loratadine 10 MG tablet Commonly known as:  CLARITIN Take 10 mg by mouth daily.   magnesium oxide 400 MG tablet Commonly known as:  MAG-OX Take 400 mg by mouth daily.   medroxyPROGESTERone 2.5 MG tablet Commonly known as:  PROVERA Take 2.5 mg by mouth daily.   predniSONE 10 MG (21) Tbpk tablet Commonly known as:  STERAPRED UNI-PAK 21 TAB 6 tabs day 1 and taper 10 mg a day - 6 days   vitamin C 1000 MG tablet Take 1,000 mg by mouth daily.   VITAMIN D3 PO Take 500 Units by mouth daily.   vitamin E 1000 UNIT  capsule Take 1,000 Units by mouth daily.       Today   VITAL SIGNS:  Blood pressure (!) 155/76, pulse 73, temperature 98 F (36.7 C), temperature source Oral, resp. rate 18, height 5\' 1"  (1.549 m), weight 70.6 kg, SpO2 100 %.  I/O:  No intake or output data in the 24 hours ending 03/16/18 1454  PHYSICAL EXAMINATION:  Physical Exam  GENERAL:  73 y.o.-year-old patient lying in the bed with no acute distress.  LUNGS: Normal breath sounds bilaterally, no wheezing, rales,rhonchi or crepitation. No use of accessory muscles of respiration.  CARDIOVASCULAR: S1, S2 normal. No murmurs, rubs, or gallops.  ABDOMEN: Soft, non-tender, non-distended. Bowel sounds present. No organomegaly or mass.  NEUROLOGIC: Moves all 4 extremities. PSYCHIATRIC: The patient is alert and oriented x 3.  SKIN: No obvious rash, lesion, or ulcer.   DATA REVIEW:   CBC No results for input(s): WBC, HGB, HCT, PLT in the last 168 hours.  Chemistries  No results for input(s): NA, K, CL, CO2, GLUCOSE, BUN, CREATININE, CALCIUM, MG, AST, ALT, ALKPHOS, BILITOT in the last 168 hours.  Invalid input(s): GFRCGP  Cardiac Enzymes No results for input(s): TROPONINI in the last 168 hours.  Microbiology Results  No results found for this or any previous visit.  RADIOLOGY:  No results found.  Follow up with PCP in 1 week.  Management plans discussed with the patient, family and they are in agreement.  CODE STATUS:  Code Status History    Date Active Date Inactive Code Status Order ID Comments User Context   03/02/2018 0852 03/04/2018 1538 DNR [id number]  Hillary Bow, MD ED    Questions for Most Recent Historical Code Status (Order [id number])    Question Answer Comment   In the event of cardiac or respiratory ARREST Do not call a "code blue"    In the event of cardiac or respiratory ARREST Do not perform Intubation, CPR, defibrillation or ACLS    In the event of cardiac or respiratory ARREST Use medication by any  route, position, wound care, and other measures to relive pain and suffering. May use oxygen, suction and manual treatment of airway obstruction as needed for comfort.         Advance Directive Documentation     Most Recent Value  Type of Advance Directive  Healthcare Power of Attorney, Living will  Pre-existing out of facility DNR order (yellow form or pink MOST form)  -  "MOST" Form in Place?  -      TOTAL TIME TAKING CARE OF THIS PATIENT ON DAY OF DISCHARGE: more than 30 minutes.   Leia Alf Rennae Ferraiolo M.D on 03/16/2018 at 2:54 PM  Between 7am to 6pm - Pager - [phone]  After 6pm go to [email or web address] - password [email or web address] [place] [facility]  Office  [phone]  CC: Primary care physician; Marguerita Merles, MD  Note: This dictation was prepared with Dragon dictation along with smaller phrase technology. Any transcriptional errors that result from this process are unintentional.

## 2018-03-30 ENCOUNTER — Encounter (INDEPENDENT_AMBULATORY_CARE_PROVIDER_SITE_OTHER): Payer: Self-pay | Admitting: Vascular Surgery

## 2018-03-30 ENCOUNTER — Ambulatory Visit (INDEPENDENT_AMBULATORY_CARE_PROVIDER_SITE_OTHER): Payer: Medicare HMO | Admitting: Vascular Surgery

## 2018-03-30 VITALS — BP 134/81 | HR 59 | Resp 16 | Ht 62.0 in | Wt 150.0 lb

## 2018-03-30 DIAGNOSIS — I8312 Varicose veins of left lower extremity with inflammation: Secondary | ICD-10-CM

## 2018-03-30 DIAGNOSIS — I872 Venous insufficiency (chronic) (peripheral): Secondary | ICD-10-CM | POA: Diagnosis not present

## 2018-03-30 DIAGNOSIS — I8311 Varicose veins of right lower extremity with inflammation: Secondary | ICD-10-CM

## 2018-03-30 NOTE — Progress Notes (Signed)
MRN : [id number]  Paula Klein is a 73 y.o. (December 09, 1945) female who presents with chief complaint of painful varicose veins.  History of Present Illness:   The patient returns to the office for followup status post laser ablation of the right and left great saphenous vein on 12/11/2017 and 12/25/2017 respectively.  The patient note significant improvement in the lower extremity pain but not resolution of the symptoms. The patient notes multiple residual varicosities bilaterally which continued to hurt with dependent positions and remained tender to palpation. The patient's swelling is minimally from preoperative status. The patient continues to wear graduated compression stockings on a daily basis but these are not eliminating the pain and discomfort. The patient continues to use over-the-counter anti-inflammatory medications to treat the pain and related symptoms but this has not given the patient relief. The patient notes the pain in the lower extremities is causing problems with daily exercise, problems at work and even with household activities such as preparing meals and doing dishes.  The patient is otherwise done well and there have been no complications related to the laser procedure or interval changes in the patient's overall   Post laser ultrasound shows successful ablation of the left and right great saphenous veins    No outpatient medications have been marked as taking for the 03/30/18 encounter (Appointment) with Delana Meyer, Dolores Lory, MD.    Past Medical History:  Diagnosis Date  . H/O blood clots   . Hypertension     Past Surgical History:  Procedure Laterality Date  . COLONOSCOPY WITH PROPOFOL N/A 09/26/2015   Procedure: COLONOSCOPY WITH PROPOFOL;  Surgeon: Lollie Sails, MD;  Location: [facility] ENDOSCOPY;  Service: Endoscopy;  Laterality: N/A;  . NO PAST SURGERIES      Social History Social History   Tobacco Use  . Smoking status: Former Research scientist (life sciences)  . Smokeless  tobacco: Never Used  Substance Use Topics  . Alcohol use: No  . Drug use: No    Family History Family History  Problem Relation Age of Onset  . Varicose Veins Mother   . Heart attack Father   . Cancer Brother     Allergies  Allergen Reactions  . Pine Tar Cough  . Sulfa Antibiotics Rash  . Tetracyclines & Related Rash     REVIEW OF SYSTEMS (Negative unless checked)  Constitutional: [] Weight loss  [] Fever  [] Chills Cardiac: [] Chest pain   [] Chest pressure   [] Palpitations   [] Shortness of breath when laying flat   [] Shortness of breath with exertion. Vascular:  [] Pain in legs with walking   [x] Pain in legs with standing  [] History of DVT   [] Phlebitis   [x] Swelling in legs   [x] Varicose veins   [] Non-healing ulcers Pulmonary:   [] Uses home oxygen   [] Productive cough   [] Hemoptysis   [] Wheeze  [] COPD   [] Asthma Neurologic:  [] Dizziness   [] Seizures   [] History of stroke   [] History of TIA  [] Aphasia   [] Vissual changes   [] Weakness or numbness in arm   [] Weakness or numbness in leg Musculoskeletal:   [] Joint swelling   [x] Joint pain   [] Low back pain Hematologic:  [] Easy bruising  [] Easy bleeding   [] Hypercoagulable state   [] Anemic Gastrointestinal:  [] Diarrhea   [] Vomiting  [] Gastroesophageal reflux/heartburn   [] Difficulty swallowing. Genitourinary:  [] Chronic kidney disease   [] Difficult urination  [] Frequent urination   [] Blood in urine Skin:  [] Rashes   [] Ulcers  Psychological:  [] History of anxiety   []  History  of major depression.  Physical Examination  There were no vitals filed for this visit. There is no height or weight on file to calculate BMI. Gen: WD/WN, NAD Head: /AT, No temporalis wasting.  Ear/Nose/Throat: Hearing grossly intact, nares w/o erythema or drainage Eyes: PER, EOMI, sclera nonicteric.  Neck: Supple, no large masses.   Pulmonary:  Good air movement, no audible wheezing bilaterally, no use of accessory muscles.  Cardiac: RRR, no JVD Vascular:  Large varicosities present extensively greater than 10 mm bilateral lower extremities.  Moderate venous stasis changes to the legs bilaterally.  2+ soft pitting edema Vessel Right Left  Radial Palpable Palpable  PT Palpable Palpable  DP Palpable Palpable  Gastrointestinal: Non-distended. No guarding/no peritoneal signs.  Musculoskeletal: M/S 5/5 throughout.  No deformity or atrophy.  Neurologic: CN 2-12 intact. Symmetrical.  Speech is fluent. Motor exam as listed above. Psychiatric: Judgment intact, Mood & affect appropriate for pt's clinical situation. Dermatologic: Venous rashes no ulcers noted.  No changes consistent with cellulitis. Lymph : No lichenification or skin changes of chronic lymphedema.  CBC Lab Results  Component Value Date   WBC 16.2 (H) 03/03/2018   HGB 11.1 (L) 03/03/2018   HCT 35.2 (L) 03/03/2018   MCV 80.4 03/03/2018   PLT 390 03/03/2018    BMET    Component Value Date/Time   NA 136 03/03/2018 0421   K 3.2 (L) 03/03/2018 0421   CL 100 03/03/2018 0421   CO2 26 03/03/2018 0421   GLUCOSE 147 (H) 03/03/2018 0421   BUN 15 03/03/2018 0421   CREATININE 0.61 03/03/2018 0421   CALCIUM 9.2 03/03/2018 0421   GFRNONAA >60 03/03/2018 0421   GFRAA >60 03/03/2018 0421   CrCl cannot be calculated (Patient's most recent lab result is older than the maximum 21 days allowed.).  COAG No results found for: INR, PROTIME  Radiology Dg Chest Portable 1 View  Result Date: 03/02/2018 CLINICAL DATA:  Acute onset of shortness of breath. EXAM: PORTABLE CHEST 1 VIEW COMPARISON:  Chest radiograph performed 07/09/2017 FINDINGS: The lungs are well-aerated. Mild bibasilar atelectasis is noted. A 4 mm nodule is suggested at the right lung base. There is no evidence of pleural effusion or pneumothorax. The cardiomediastinal silhouette is borderline normal in size. No acute osseous abnormalities are seen. IMPRESSION: 1. Mild bibasilar atelectasis noted; lungs otherwise clear. 2. 4 mm  nodule suggested at the right lung base. CT of the chest could be considered for further evaluation on an elective nonemergent basis. Electronically Signed   By: Garald Balding M.D.   On: 03/02/2018 05:30     Assessment/Plan 1. Varicose veins of both lower extremities with inflammation Recommend:  The patient has had successful ablation of the previously incompetent saphenous venous system bilaterallybut still has persistent symptoms of pain and swelling that are having a negative impact on daily life and daily activities.  Patient should undergo injection sclerotherapy to treat the residual varicosities.  The risks, benefits and alternative therapies were reviewed in detail with the patient.  All questions were answered.  The patient agrees to proceed with sclerotherapy at their convenience.  The patient will continue wearing the graduated compression stockings and using the over-the-counter pain medications to treat her symptoms.      2. Chronic venous insufficiency No surgery or intervention at this point in time.    I have had a long discussion with the patient regarding venous insufficiency and why it  causes symptoms. I have discussed with the patient the chronic  skin changes that accompany venous insufficiency and the long term sequela such as infection and ulceration.  Patient will begin wearing graduated compression stockings class 1 (20-30 mmHg) or compression wraps on a daily basis a prescription was given. The patient will put the stockings on first thing in the morning and removing them in the evening. The patient is instructed specifically not to sleep in the stockings.    In addition, behavioral modification including several periods of elevation of the lower extremities during the day will be continued. I have demonstrated that proper elevation is a position with the ankles at heart level.  The patient is instructed to begin routine exercise, especially walking on a daily  basis     Hortencia Pilar, MD  03/30/2018 1:00 PM

## 2018-04-06 ENCOUNTER — Ambulatory Visit (INDEPENDENT_AMBULATORY_CARE_PROVIDER_SITE_OTHER): Payer: Medicare Other | Admitting: Pulmonary Disease

## 2018-04-06 ENCOUNTER — Encounter: Payer: Self-pay | Admitting: Pulmonary Disease

## 2018-04-06 VITALS — BP 130/70 | HR 72 | Resp 16 | Ht 62.0 in | Wt 151.0 lb

## 2018-04-06 DIAGNOSIS — J45909 Unspecified asthma, uncomplicated: Secondary | ICD-10-CM | POA: Diagnosis not present

## 2018-04-06 DIAGNOSIS — Z87891 Personal history of nicotine dependence: Secondary | ICD-10-CM

## 2018-04-06 DIAGNOSIS — R911 Solitary pulmonary nodule: Secondary | ICD-10-CM | POA: Diagnosis not present

## 2018-04-06 NOTE — Patient Instructions (Addendum)
Continue albuterol inhaler as needed for shortness of breath, chest tightness, wheezing, cough Follow-up in 3 months with lung function tests (PFTs) and chest x-ray prior to that visit Call sooner if you develop any breathing, chest or lung symptoms

## 2018-04-06 NOTE — Progress Notes (Signed)
PULMONARY CONSULT NOTE  Requesting MD/Service: Post hospitalization referral.  Primary MD: Delight Stare Date of initial consultation: 04/06/18 Reason for consultation: Post hospitalization for COPD exacerbation  PT PROFILE: 73 y.o. female former smoker (up to 3 PPD x25 years, quit 1990) recently hospitalized for COPD exacerbation and referred to pulmonary clinic upon discharge  DATA:   INTERVAL:  HPI:  As above.  She was hospitalized on 03/02/2018 via [facility] ED when she awoke with severe shortness of breath.  She was treated as a COPD exacerbation and discharged on 03/04/2018 with this follow-up arranged.  At her baseline she has class I dyspnea (DOE only when walking inclines or stairs) and she feels that she is now back to her baseline.  At the time of her hospitalization she was not able to discern any triggering factors.  She had not had any sick contacts.  In addition to dyspnea, she had chest tightness.  At the present time, all of the symptoms have resolved and she denies significant chest tightness, fever, cough, sputum production, hemoptysis, calf tenderness, orthopnea, paroxysmal nocturnal dyspnea.  She does have chronic intermittent lower extremity edema due to chronic venous stasis and varicose veins.  Her only medication presently is albuterol rescue inhaler which she is using fewer than 3 times per week.  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  She was not evaluated for venous thromboembolism at the time of her hospitalization.  She is a former smoker as documented above.  She was employed in [employer] but did not have significant dust exposures in the course of her work.  She has no significant travel history and no environmental exposures presently.  She has no pets in the home.  She was never in the [employer]  Past Medical History:  Diagnosis Date  . H/O blood clots   . Hypertension     Past Surgical History:  Procedure Laterality Date  .  COLONOSCOPY WITH PROPOFOL N/A 09/26/2015   Procedure: COLONOSCOPY WITH PROPOFOL;  Surgeon: Lollie Sails, MD;  Location: [facility] ENDOSCOPY;  Service: Endoscopy;  Laterality: N/A;  . NO PAST SURGERIES      MEDICATIONS: I have reviewed all medications and confirmed regimen as documented  Social History   Socioeconomic History  . Marital status: Widowed    Spouse name: Not on file  . Number of children: Not on file  . Years of education: Not on file  . Highest education level: Not on file  Occupational History  . Not on file  Social Needs  . Financial resource strain: Not on file  . Food insecurity:    Worry: Not on file    Inability: Not on file  . Transportation needs:    Medical: Not on file    Non-medical: Not on file  Tobacco Use  . Smoking status: Former Smoker    Packs/day: 3.00    Years: 30.00    Pack years: 90.00    Last attempt to quit: 02/03/1989    Years since quitting: 29.1  . Smokeless tobacco: Never Used  Substance and Sexual Activity  . Alcohol use: No  . Drug use: No  . Sexual activity: Not on file  Lifestyle  . Physical activity:    Days per week: Not on file    Minutes per session: Not on file  . Stress: Not on file  Relationships  . Social connections:    Talks on phone: Not on file  Gets together: Not on file    Attends religious service: Not on file    Active member of club or organization: Not on file    Attends meetings of clubs or organizations: Not on file    Relationship status: Not on file  . Intimate partner violence:    Fear of current or ex partner: Not on file    Emotionally abused: Not on file    Physically abused: Not on file    Forced sexual activity: Not on file  Other Topics Concern  . Not on file  Social History Narrative  . Not on file    Family History  Problem Relation Age of Onset  . Varicose Veins Mother   . Heart attack Father   . Cancer Brother     ROS: No fever, myalgias/arthralgias, unexplained weight  loss or weight gain No new focal weakness or sensory deficits No otalgia, hearing loss, visual changes, nasal and sinus symptoms, mouth and throat problems No neck pain or adenopathy No abdominal pain, N/V/D, diarrhea, change in bowel pattern No dysuria, change in urinary pattern   Vitals:   04/06/18 1355 04/06/18 1405  BP:  130/70  Pulse:  72  Resp: 16   SpO2:  94%  Weight: 151 lb (68.5 kg)   Height: 5\' 2"  (1.575 m)   Room air   EXAM:  Gen: WDWN, No overt respiratory distress HEENT: NCAT, sclera white, oropharynx normal Neck: Supple without LAN, thyromegaly, JVD Lungs: breath sounds full, percussion normal, adventitious sounds: None Cardiovascular: RRR, no murmurs noted Abdomen: Soft, nontender, normal BS Ext: without clubbing, cyanosis, edema Neuro: CNs grossly intact, motor and sensory intact Skin: Limited exam, no lesions noted  DATA:   BMP Latest Ref Rng & Units 03/03/2018 03/02/2018 07/09/2017  Glucose 70 - 99 mg/dL 147(H) 108(H) 108(H)  BUN 8 - 23 mg/dL 15 14 10   Creatinine 0.44 - 1.00 mg/dL 0.61 0.74 0.59  Sodium 135 - 145 mmol/L 136 137 136  Potassium 3.5 - 5.1 mmol/L 3.2(L) 3.2(L) 2.9(L)  Chloride 98 - 111 mmol/L 100 101 98(L)  CO2 22 - 32 mmol/L 26 27 29   Calcium 8.9 - 10.3 mg/dL 9.2 9.0 9.4    CBC Latest Ref Rng & Units 03/03/2018 03/02/2018 07/09/2017  WBC 4.0 - 10.5 K/uL 16.2(H) 9.2 7.1  Hemoglobin 12.0 - 15.0 g/dL 11.1(L) 11.6(L) 11.6(L)  Hematocrit 36.0 - 46.0 % 35.2(L) 36.2 35.2  Platelets 150 - 400 K/uL 390 359 329    CXR 03/02/2018: No acute findings.  Mild bibasilar interstitial prominence with possible 4 mm nodule and RLL  I have personally reviewed all chest radiographs reported above including CXRs and CT chest unless otherwise indicated  IMPRESSION:     ICD-10-CM   1. Former smoker Z87.891 Pulmonary Function Test [facility] Only  2. Lung nodule R91.1 DG Chest 2 View  3. Recent hospitalization for asthmatic bronchitis, now fully resolved J45.909     Smoking history is very remote. Recent bout of asthmatic bronchitis appears to be fully resolved. Baseline exercise tolerance and respiratory status is excellent I am not entirely convinced that there is a nodule in the RLL and do not believe that it warrants further evaluation at this time  PLAN:  Continue albuterol inhaler as needed for shortness of breath, chest tightness, wheezing, cough  Follow-up in 3 months with PFTs and repeat CXR at that time  She is instructed to call sooner if she develops any breathing, chest or lung problems   Shanon Brow  Alva Garnet, MD PCCM service Mobile [phone] Pager [phone] 04/09/2018 12:21 PM

## 2018-04-09 ENCOUNTER — Encounter: Payer: Self-pay | Admitting: Pulmonary Disease

## 2018-04-27 ENCOUNTER — Ambulatory Visit (INDEPENDENT_AMBULATORY_CARE_PROVIDER_SITE_OTHER): Payer: Medicare HMO | Admitting: Nurse Practitioner

## 2018-04-29 ENCOUNTER — Encounter (INDEPENDENT_AMBULATORY_CARE_PROVIDER_SITE_OTHER): Payer: Self-pay | Admitting: Nurse Practitioner

## 2018-04-29 ENCOUNTER — Ambulatory Visit (INDEPENDENT_AMBULATORY_CARE_PROVIDER_SITE_OTHER): Payer: Medicare Other | Admitting: Nurse Practitioner

## 2018-04-29 ENCOUNTER — Other Ambulatory Visit: Payer: Self-pay

## 2018-04-29 VITALS — BP 147/76 | HR 69 | Resp 10 | Ht 61.0 in | Wt 152.0 lb

## 2018-04-29 DIAGNOSIS — I8311 Varicose veins of right lower extremity with inflammation: Secondary | ICD-10-CM

## 2018-04-29 DIAGNOSIS — I8312 Varicose veins of left lower extremity with inflammation: Secondary | ICD-10-CM

## 2018-04-29 NOTE — Progress Notes (Signed)
Varicose veins of left lower extremity with inflammation (454.1  I83.10) Current Plans   Indication: Patient presents with symptomatic varicose veins of the left lower extremity.   Procedure: Sclerotherapy using hypertonic saline mixed with 1% Lidocaine was performed on the left lower extremity. Compression wraps were placed. The patient tolerated the procedure well. 

## 2018-05-18 ENCOUNTER — Ambulatory Visit (INDEPENDENT_AMBULATORY_CARE_PROVIDER_SITE_OTHER): Payer: Medicare HMO | Admitting: Nurse Practitioner

## 2018-05-20 ENCOUNTER — Ambulatory Visit (INDEPENDENT_AMBULATORY_CARE_PROVIDER_SITE_OTHER): Payer: Medicare Other | Admitting: Nurse Practitioner

## 2018-05-20 ENCOUNTER — Other Ambulatory Visit: Payer: Self-pay

## 2018-05-20 ENCOUNTER — Encounter (INDEPENDENT_AMBULATORY_CARE_PROVIDER_SITE_OTHER): Payer: Self-pay | Admitting: Nurse Practitioner

## 2018-05-20 VITALS — BP 123/73 | HR 67 | Resp 16 | Wt 152.0 lb

## 2018-05-20 DIAGNOSIS — I8312 Varicose veins of left lower extremity with inflammation: Secondary | ICD-10-CM | POA: Diagnosis not present

## 2018-05-20 DIAGNOSIS — I8311 Varicose veins of right lower extremity with inflammation: Secondary | ICD-10-CM

## 2018-05-27 ENCOUNTER — Encounter (INDEPENDENT_AMBULATORY_CARE_PROVIDER_SITE_OTHER): Payer: Self-pay | Admitting: Nurse Practitioner

## 2018-05-27 NOTE — Progress Notes (Signed)
Varicose veins of right  lower extremity with inflammation (454.1  I83.10) Current Plans   Indication: Patient presents with symptomatic varicose veins of the right  lower extremity.   Procedure: Sclerotherapy using hypertonic saline mixed with 1% Lidocaine was performed on the right lower extremity. Compression wraps were placed. The patient tolerated the procedure well. 

## 2018-06-01 ENCOUNTER — Ambulatory Visit (INDEPENDENT_AMBULATORY_CARE_PROVIDER_SITE_OTHER): Payer: Medicare HMO | Admitting: Nurse Practitioner

## 2018-06-03 ENCOUNTER — Ambulatory Visit (INDEPENDENT_AMBULATORY_CARE_PROVIDER_SITE_OTHER): Payer: Medicare Other | Admitting: Nurse Practitioner

## 2018-07-02 ENCOUNTER — Ambulatory Visit: Payer: Medicare Other

## 2018-08-04 ENCOUNTER — Ambulatory Visit: Payer: Medicare Other

## 2018-08-05 ENCOUNTER — Other Ambulatory Visit: Payer: Self-pay

## 2018-08-10 ENCOUNTER — Other Ambulatory Visit
Admission: RE | Admit: 2018-08-10 | Discharge: 2018-08-10 | Disposition: A | Discharge status: Home / Self Care | Payer: Medicare Other | Source: Ambulatory Visit | Attending: Pulmonary Disease | Admitting: Pulmonary Disease

## 2018-08-10 ENCOUNTER — Other Ambulatory Visit: Payer: Self-pay

## 2018-08-10 DIAGNOSIS — Z1159 Encounter for screening for other viral diseases: Secondary | ICD-10-CM | POA: Insufficient documentation

## 2018-08-11 LAB — NOVEL CORONAVIRUS, NAA (HOSP ORDER, SEND-OUT TO REF LAB; TAT 18-24 HRS): SARS-CoV-2, NAA: NOT DETECTED

## 2018-08-13 ENCOUNTER — Telehealth: Payer: Self-pay | Admitting: Pulmonary Disease

## 2018-08-13 ENCOUNTER — Ambulatory Visit
Admission: RE | Admit: 2018-08-13 | Discharge: 2018-08-13 | Disposition: A | Discharge status: Home / Self Care | Payer: Medicare Other | Source: Ambulatory Visit | Attending: Pulmonary Disease | Admitting: Pulmonary Disease

## 2018-08-13 ENCOUNTER — Other Ambulatory Visit: Payer: Self-pay

## 2018-08-13 ENCOUNTER — Ambulatory Visit (HOSPITAL_COMMUNITY): Payer: Medicare Other

## 2018-08-13 DIAGNOSIS — Z87891 Personal history of nicotine dependence: Secondary | ICD-10-CM

## 2018-08-13 DIAGNOSIS — R911 Solitary pulmonary nodule: Secondary | ICD-10-CM

## 2018-08-13 NOTE — Telephone Encounter (Signed)
Called and spoke to pt, who is requesting cxr and PFT results.  Pt has pending office visit for 08/17/2018.  Advised pt that these results are typically discussed at time of office visit.   DS please advise on results. Thanks

## 2018-08-14 NOTE — Telephone Encounter (Signed)
Results will be discussed with pt at upcoming appointment.  Pt is aware.  Nothing further is needed.

## 2018-08-17 ENCOUNTER — Ambulatory Visit (INDEPENDENT_AMBULATORY_CARE_PROVIDER_SITE_OTHER): Payer: Medicare Other | Admitting: Pulmonary Disease

## 2018-08-17 ENCOUNTER — Encounter: Payer: Self-pay | Admitting: Pulmonary Disease

## 2018-08-17 ENCOUNTER — Other Ambulatory Visit: Payer: Self-pay

## 2018-08-17 VITALS — BP 130/70 | HR 67 | Temp 97.5°F | Ht 61.0 in | Wt 156.0 lb

## 2018-08-17 DIAGNOSIS — R911 Solitary pulmonary nodule: Secondary | ICD-10-CM

## 2018-08-17 DIAGNOSIS — J449 Chronic obstructive pulmonary disease, unspecified: Secondary | ICD-10-CM

## 2018-08-17 DIAGNOSIS — Z87891 Personal history of nicotine dependence: Secondary | ICD-10-CM | POA: Diagnosis not present

## 2018-08-17 MED ORDER — BREO ELLIPTA 100-25 MCG/INH IN AEPB: 1.0000 | INHALATION_SPRAY | Freq: Every day | RESPIRATORY_TRACT | 0 refills | Status: AC

## 2018-08-17 MED ORDER — BREO ELLIPTA 100-25 MCG/INH IN AEPB: 1.0000 | INHALATION_SPRAY | Freq: Every day | RESPIRATORY_TRACT | 5 refills | Status: AC

## 2018-08-17 NOTE — Progress Notes (Signed)
PULMONARY OFFICE FOLLOW-UP NOTE  Requesting MD/Service: Post hospitalization referral.  Primary MD: Delight Stare Date of initial consultation: 04/06/18 Reason for consultation: Post hospitalization for COPD exacerbation  PT PROFILE: 73 y.o. female former smoker (up to 3 PPD x25 years, quit 1990) recently hospitalized for COPD exacerbation and referred to pulmonary clinic upon discharge  DATA: 08/13/18 PFTs: FVC: 2.25 > 2.57 L (83 > 95 %pred), FEV1: 1.28 > 1.61 L (63 >79 %pred), FEV1/FVC: 57, TLC: 4.65 L (97 %pred), DLCO 73 %pred, DLCO/VA 93% predicted    INTERVAL: Last visit 04/06/18.  No major pulmonary events in the interim.  Spiriva inhaler initiated by primary care physician.   SUBJ:  This is a scheduled follow-up.  She is here to review results of pulmonary function tests and repeat chest x-ray (discussed below.  Since last seen, Spiriva inhaler was initiated which she believes helps her breathing "a little".  This was initiated a couple of months ago.  She uses albuterol inhaler approximately 2 times per day which she believes "helps a lot".  She has minimal cough with scant sputum production.  She denies significant chest pain/tightness.  She has no fever, hemoptysis, orthopnea, paroxysmal nocturnal dyspnea, calf tenderness, lower extremity edema.  She remains mildly-moderately limited by dyspnea.   Vitals:   08/17/18 1018  BP: 130/70  Pulse: 67  Temp: (!) 97.5 F (36.4 C)  TempSrc: Oral  SpO2: 100%  Weight: 156 lb (70.8 kg)  Height: 5\' 1"  (1.549 m)  Room air   EXAM:  Gen: NAD HEENT: NCAT, sclerae white Neck: No JVD Lungs: breath sounds full, no wheezes or other adventitious sounds Cardiovascular: RRR, no murmurs Abdomen: Soft, nontender, normal BS Ext: without clubbing, cyanosis, edema Neuro: grossly intact Skin: Limited exam, no lesions noted   DATA:   BMP Latest Ref Rng & Units 03/03/2018 03/02/2018 07/09/2017  Glucose 70 - 99 mg/dL 147(H) 108(H) 108(H)  BUN 8 -  23 mg/dL 15 14 10   Creatinine 0.44 - 1.00 mg/dL 0.61 0.74 0.59  Sodium 135 - 145 mmol/L 136 137 136  Potassium 3.5 - 5.1 mmol/L 3.2(L) 3.2(L) 2.9(L)  Chloride 98 - 111 mmol/L 100 101 98(L)  CO2 22 - 32 mmol/L 26 27 29   Calcium 8.9 - 10.3 mg/dL 9.2 9.0 9.4    CBC Latest Ref Rng & Units 03/03/2018 03/02/2018 07/09/2017  WBC 4.0 - 10.5 K/uL 16.2(H) 9.2 7.1  Hemoglobin 12.0 - 15.0 g/dL 11.1(L) 11.6(L) 11.6(L)  Hematocrit 36.0 - 46.0 % 35.2(L) 36.2 35.2  Platelets 150 - 400 K/uL 390 359 329    CXR 08/13/2018: The official reading again suggests a RLL nodule.  I remain unconvinced that an actual nodule exists.  There are no acute findings.  I have personally reviewed all chest radiographs reported above including CXRs and CT chest unless otherwise indicated  IMPRESSION:     ICD-10-CM   1. Moderate COPD (chronic obstructive pulmonary disease) (HCC)  J44.9   2. Chronic asthmatic bronchitis ([place])  J44.9   3. Former smoker  Z87.891   4. Possible very small RLL nodule - I remain unconvinced that there is a nodule there but it has been called on 2 CXR reports  R91.1    She has mild to moderate COPD at baseline.  There appears to be a significant reversible (asthmatic) component to her airways disease as evidenced by 26% improvement in FEV1 postbronchodilator.  We reviewed her PFTs together in the office today.  Given the significant improvement in airflow obstruction  after bronchodilator therapy, I think she might benefit from an ICS/LABA rather than and a LAMA as maintenance therapy.  We reviewed her CXR together in the office.  I shared with her the official reading by the radiologist suggesting a nodule in the RLL.  I informed her that I am unconvinced that there is any such nodule based on my reading of the CXR.  I shared with her the recommendation of a CT scan but offered that we can continue to follow this with chest x-rays if she prefers to do so.  This is what she prefers.  PLAN:  Stop  Spiriva inhaler Begin Breo inhaler 100-25, 1 inhalation daily in the morning.  Rinse mouth after use Continue albuterol inhaler as needed for increased shortness of breath, wheezing, chest tightness, cough Follow-up in 3 months.  Call sooner if needed She should have a repeat chest x-ray in 6 months (December 2020)   Merton Border, MD PCCM service Mobile [phone] Pager [phone] 08/17/2018 1:31 PM

## 2018-08-17 NOTE — Patient Instructions (Addendum)
Stop Spiriva inhaler Begin Breo inhaler 100-25, 1 inhalation daily in the morning.  Rinse mouth after use Continue albuterol inhaler as needed for increased shortness of breath, wheezing, chest tightness, cough Follow-up in 3 months.  Call sooner if needed We will need to repeat chest x-ray in 6 months (December 2020)

## 2018-09-07 ENCOUNTER — Ambulatory Visit (INDEPENDENT_AMBULATORY_CARE_PROVIDER_SITE_OTHER): Payer: Medicare Other | Admitting: Nurse Practitioner

## 2018-09-09 ENCOUNTER — Other Ambulatory Visit: Payer: Self-pay

## 2018-09-09 ENCOUNTER — Ambulatory Visit (INDEPENDENT_AMBULATORY_CARE_PROVIDER_SITE_OTHER): Payer: Medicare Other | Admitting: Vascular Surgery

## 2018-09-09 ENCOUNTER — Encounter (INDEPENDENT_AMBULATORY_CARE_PROVIDER_SITE_OTHER): Payer: Self-pay | Admitting: Vascular Surgery

## 2018-09-09 VITALS — BP 147/78 | HR 68 | Resp 16 | Wt 151.4 lb

## 2018-09-09 DIAGNOSIS — I8312 Varicose veins of left lower extremity with inflammation: Secondary | ICD-10-CM | POA: Diagnosis not present

## 2018-09-09 DIAGNOSIS — I8311 Varicose veins of right lower extremity with inflammation: Secondary | ICD-10-CM | POA: Diagnosis not present

## 2018-09-09 NOTE — Progress Notes (Signed)
Varicose veins of bilateral  lower extremity with inflammation (454.1  I83.10) Current Plans   Indication: Patient presents with symptomatic varicose veins of the bilateral  lower extremity.   Procedure: Sclerotherapy using hypertonic saline mixed with 1% Lidocaine was performed on the bilateral lower extremity. Compression wraps were placed. The patient tolerated the procedure well. 

## 2018-10-07 ENCOUNTER — Ambulatory Visit (INDEPENDENT_AMBULATORY_CARE_PROVIDER_SITE_OTHER): Payer: Medicare Other | Admitting: Vascular Surgery

## 2018-10-07 ENCOUNTER — Encounter (INDEPENDENT_AMBULATORY_CARE_PROVIDER_SITE_OTHER): Payer: Self-pay | Admitting: Vascular Surgery

## 2018-10-07 ENCOUNTER — Other Ambulatory Visit: Payer: Self-pay

## 2018-10-07 ENCOUNTER — Encounter (INDEPENDENT_AMBULATORY_CARE_PROVIDER_SITE_OTHER): Payer: Self-pay

## 2018-10-07 VITALS — BP 147/76 | HR 61 | Resp 16 | Ht 61.0 in | Wt 155.8 lb

## 2018-10-07 DIAGNOSIS — I872 Venous insufficiency (chronic) (peripheral): Secondary | ICD-10-CM

## 2018-10-07 DIAGNOSIS — I8311 Varicose veins of right lower extremity with inflammation: Secondary | ICD-10-CM

## 2018-10-07 DIAGNOSIS — I8312 Varicose veins of left lower extremity with inflammation: Secondary | ICD-10-CM | POA: Diagnosis not present

## 2018-10-07 NOTE — Progress Notes (Signed)
Varicose veins of bilateral  lower extremity with inflammation (454.1  I83.10) Current Plans   Indication: Patient presents with symptomatic varicose veins of the bilateral  lower extremity.   Procedure: Sclerotherapy using hypertonic saline mixed with 1% Lidocaine was performed on the bilateral lower extremity. Compression wraps were placed. The patient tolerated the procedure well. 

## 2018-10-26 ENCOUNTER — Ambulatory Visit (INDEPENDENT_AMBULATORY_CARE_PROVIDER_SITE_OTHER): Payer: Medicare Other | Admitting: Nurse Practitioner

## 2018-11-02 ENCOUNTER — Ambulatory Visit (INDEPENDENT_AMBULATORY_CARE_PROVIDER_SITE_OTHER): Payer: Medicare Other | Admitting: Nurse Practitioner

## 2018-11-02 ENCOUNTER — Other Ambulatory Visit: Payer: Self-pay

## 2018-11-02 ENCOUNTER — Encounter (INDEPENDENT_AMBULATORY_CARE_PROVIDER_SITE_OTHER): Payer: Self-pay | Admitting: Nurse Practitioner

## 2018-11-02 VITALS — BP 145/76 | HR 67 | Resp 16 | Wt 156.0 lb

## 2018-11-02 DIAGNOSIS — I8311 Varicose veins of right lower extremity with inflammation: Secondary | ICD-10-CM | POA: Diagnosis not present

## 2018-11-02 DIAGNOSIS — I8312 Varicose veins of left lower extremity with inflammation: Secondary | ICD-10-CM

## 2018-11-02 NOTE — Progress Notes (Signed)
Varicose veins of the bilateral  lower extremity with inflammation (454.1  I83.10) Current Plans   Indication: Patient presents with symptomatic varicose veins of the bilateral  lower extremity.   Procedure: Sclerotherapy using hypertonic saline mixed with 1% Lidocaine was performed on the bilateral lower extremity. Compression wraps were placed. The patient tolerated the procedure well.

## 2018-11-30 ENCOUNTER — Encounter (INDEPENDENT_AMBULATORY_CARE_PROVIDER_SITE_OTHER): Payer: Self-pay

## 2018-11-30 ENCOUNTER — Encounter (INDEPENDENT_AMBULATORY_CARE_PROVIDER_SITE_OTHER): Payer: Self-pay | Admitting: Nurse Practitioner

## 2018-11-30 ENCOUNTER — Ambulatory Visit (INDEPENDENT_AMBULATORY_CARE_PROVIDER_SITE_OTHER): Payer: Medicare Other | Admitting: Nurse Practitioner

## 2018-11-30 ENCOUNTER — Other Ambulatory Visit: Payer: Self-pay

## 2018-11-30 VITALS — BP 146/83 | HR 73 | Resp 16 | Wt 160.0 lb

## 2018-11-30 DIAGNOSIS — I8312 Varicose veins of left lower extremity with inflammation: Secondary | ICD-10-CM | POA: Diagnosis not present

## 2018-11-30 DIAGNOSIS — I8311 Varicose veins of right lower extremity with inflammation: Secondary | ICD-10-CM

## 2018-11-30 NOTE — Progress Notes (Signed)
Varicose veins of bilateral  lower extremity with inflammation (454.1  I83.10) Current Plans   Indication: Patient presents with symptomatic varicose veins of the bilateral  lower extremity.   Procedure: Sclerotherapy using hypertonic saline mixed with 1% Lidocaine was performed on the bilateral lower extremity. Compression wraps were placed. The patient tolerated the procedure well. 

## 2018-12-28 ENCOUNTER — Ambulatory Visit (INDEPENDENT_AMBULATORY_CARE_PROVIDER_SITE_OTHER): Payer: Medicare Other | Admitting: Nurse Practitioner

## 2018-12-28 ENCOUNTER — Other Ambulatory Visit: Payer: Self-pay

## 2018-12-28 ENCOUNTER — Encounter (INDEPENDENT_AMBULATORY_CARE_PROVIDER_SITE_OTHER): Payer: Self-pay | Admitting: Nurse Practitioner

## 2018-12-28 VITALS — BP 132/75 | HR 62 | Resp 16 | Wt 160.0 lb

## 2018-12-28 DIAGNOSIS — I8311 Varicose veins of right lower extremity with inflammation: Secondary | ICD-10-CM

## 2018-12-28 DIAGNOSIS — I8312 Varicose veins of left lower extremity with inflammation: Secondary | ICD-10-CM

## 2018-12-28 NOTE — Progress Notes (Signed)
Varicose veins of bilateral  lower extremity with inflammation (454.1  I83.10) Current Plans   Indication: Patient presents with symptomatic varicose veins of the bilateral  lower extremity.   Procedure: Sclerotherapy using hypertonic saline mixed with 1% Lidocaine was performed on the bilateral lower extremity. Compression wraps were placed. The patient tolerated the procedure well. 

## 2019-01-25 ENCOUNTER — Ambulatory Visit (INDEPENDENT_AMBULATORY_CARE_PROVIDER_SITE_OTHER): Payer: Medicare Other | Admitting: Nurse Practitioner

## 2019-01-25 ENCOUNTER — Encounter (INDEPENDENT_AMBULATORY_CARE_PROVIDER_SITE_OTHER): Payer: Self-pay

## 2019-01-25 ENCOUNTER — Other Ambulatory Visit: Payer: Self-pay

## 2019-01-25 ENCOUNTER — Encounter (INDEPENDENT_AMBULATORY_CARE_PROVIDER_SITE_OTHER): Payer: Self-pay | Admitting: Nurse Practitioner

## 2019-01-25 VITALS — BP 159/79 | HR 71 | Resp 16 | Wt 159.4 lb

## 2019-01-25 DIAGNOSIS — I8312 Varicose veins of left lower extremity with inflammation: Secondary | ICD-10-CM | POA: Diagnosis not present

## 2019-01-25 DIAGNOSIS — I8311 Varicose veins of right lower extremity with inflammation: Secondary | ICD-10-CM | POA: Diagnosis not present

## 2019-01-25 NOTE — Progress Notes (Signed)
Varicose veins of bilateral  lower extremity with inflammation (454.1  I83.10) Current Plans   Indication: Patient presents with symptomatic varicose veins of the bilateral  lower extremity.   Procedure: Sclerotherapy using hypertonic saline mixed with 1% Lidocaine was performed on the bilateral lower extremity. Compression wraps were placed. The patient tolerated the procedure well. 

## 2019-02-05 ENCOUNTER — Other Ambulatory Visit (HOSPITAL_COMMUNITY): Payer: Self-pay | Admitting: Family Medicine

## 2019-02-05 DIAGNOSIS — Z1231 Encounter for screening mammogram for malignant neoplasm of breast: Secondary | ICD-10-CM

## 2019-02-15 ENCOUNTER — Ambulatory Visit (HOSPITAL_COMMUNITY)
Admission: RE | Admit: 2019-02-15 | Discharge: 2019-02-15 | Disposition: A | Discharge status: Home / Self Care | Payer: Medicare Other | Source: Ambulatory Visit | Attending: Family Medicine | Admitting: Family Medicine

## 2019-02-15 ENCOUNTER — Other Ambulatory Visit: Payer: Self-pay

## 2019-02-15 DIAGNOSIS — Z1231 Encounter for screening mammogram for malignant neoplasm of breast: Secondary | ICD-10-CM | POA: Diagnosis not present

## 2019-02-17 ENCOUNTER — Other Ambulatory Visit (HOSPITAL_COMMUNITY): Payer: Self-pay | Admitting: Family Medicine

## 2019-02-17 DIAGNOSIS — R922 Inconclusive mammogram: Secondary | ICD-10-CM

## 2019-02-22 ENCOUNTER — Encounter (INDEPENDENT_AMBULATORY_CARE_PROVIDER_SITE_OTHER): Payer: Self-pay | Admitting: Nurse Practitioner

## 2019-02-22 ENCOUNTER — Other Ambulatory Visit: Payer: Self-pay

## 2019-02-22 ENCOUNTER — Ambulatory Visit (INDEPENDENT_AMBULATORY_CARE_PROVIDER_SITE_OTHER): Payer: Medicare Other | Admitting: Nurse Practitioner

## 2019-02-22 VITALS — BP 135/75 | HR 71 | Resp 18 | Ht 61.0 in | Wt 156.0 lb

## 2019-02-22 DIAGNOSIS — I8312 Varicose veins of left lower extremity with inflammation: Secondary | ICD-10-CM | POA: Diagnosis not present

## 2019-02-22 DIAGNOSIS — I8311 Varicose veins of right lower extremity with inflammation: Secondary | ICD-10-CM

## 2019-02-28 NOTE — Progress Notes (Signed)
Varicose veins of bilateral  lower extremity with inflammation (454.1  I83.10) Current Plans   Indication: Patient presents with symptomatic varicose veins of the bilateral  lower extremity.   Procedure: Sclerotherapy using hypertonic saline mixed with 1% Lidocaine was performed on the bilateral lower extremity. Compression wraps were placed. The patient tolerated the procedure well. 

## 2019-03-09 ENCOUNTER — Ambulatory Visit (HOSPITAL_COMMUNITY): Payer: Medicare Other

## 2019-03-09 ENCOUNTER — Encounter (HOSPITAL_COMMUNITY): Payer: Medicare Other

## 2019-03-16 ENCOUNTER — Other Ambulatory Visit: Payer: Self-pay

## 2019-03-16 ENCOUNTER — Ambulatory Visit (HOSPITAL_COMMUNITY): Admission: RE | Admit: 2019-03-16 | Payer: Medicare HMO | Source: Ambulatory Visit

## 2019-03-16 ENCOUNTER — Ambulatory Visit (HOSPITAL_COMMUNITY)
Admission: RE | Admit: 2019-03-16 | Discharge: 2019-03-16 | Disposition: A | Discharge status: Home / Self Care | Payer: Medicare HMO | Source: Ambulatory Visit | Attending: Family Medicine | Admitting: Family Medicine

## 2019-03-16 DIAGNOSIS — R922 Inconclusive mammogram: Secondary | ICD-10-CM | POA: Diagnosis not present

## 2019-03-22 ENCOUNTER — Ambulatory Visit (INDEPENDENT_AMBULATORY_CARE_PROVIDER_SITE_OTHER): Payer: Medicare HMO | Admitting: Nurse Practitioner

## 2019-03-22 ENCOUNTER — Other Ambulatory Visit: Payer: Self-pay

## 2019-03-22 VITALS — BP 151/87 | HR 87 | Resp 12 | Ht 61.0 in | Wt 158.0 lb

## 2019-03-22 DIAGNOSIS — I8311 Varicose veins of right lower extremity with inflammation: Secondary | ICD-10-CM

## 2019-03-22 DIAGNOSIS — I8312 Varicose veins of left lower extremity with inflammation: Secondary | ICD-10-CM | POA: Diagnosis not present

## 2019-03-22 NOTE — Progress Notes (Signed)
Varicose veins of the bilateral   lower extremity with inflammation (454.1  I83.10) Current Plans   Indication: Patient presents with symptomatic varicose veins of the bilateral  lower extremity.   Procedure: Sclerotherapy using hypertonic saline mixed with 1% Lidocaine was performed on the bilateral lower extremity. Compression wraps were placed. The patient tolerated the procedure well.  Patient needs to have significant varicosities despite treatment.  These varicosities continue the patient pain on a daily basis.  She has some very prominent ones in her left lower extremity.  These residual varicosities continue to cause swelling, pain, itching and discomfort.  Enough to begin to affect her activities of every day living.  I recommend that the patient continue with sclerotherapy treatment for treatment of the residual varicosities.  Patient previously had a successful great saphenous vein ablation bilaterally in 2019.

## 2019-04-19 ENCOUNTER — Ambulatory Visit (INDEPENDENT_AMBULATORY_CARE_PROVIDER_SITE_OTHER): Payer: Medicare HMO | Admitting: Nurse Practitioner

## 2019-04-30 ENCOUNTER — Encounter (INDEPENDENT_AMBULATORY_CARE_PROVIDER_SITE_OTHER): Payer: Self-pay | Admitting: Nurse Practitioner

## 2019-04-30 ENCOUNTER — Ambulatory Visit (INDEPENDENT_AMBULATORY_CARE_PROVIDER_SITE_OTHER): Payer: Medicare HMO | Admitting: Nurse Practitioner

## 2019-04-30 ENCOUNTER — Other Ambulatory Visit: Payer: Self-pay

## 2019-04-30 VITALS — BP 124/75 | HR 66 | Resp 10 | Ht 61.0 in | Wt 163.0 lb

## 2019-04-30 DIAGNOSIS — I8312 Varicose veins of left lower extremity with inflammation: Secondary | ICD-10-CM

## 2019-04-30 DIAGNOSIS — I8311 Varicose veins of right lower extremity with inflammation: Secondary | ICD-10-CM

## 2019-04-30 NOTE — Progress Notes (Signed)
Varicose veins of bilateral  lower extremity with inflammation (454.1  I83.10) Current Plans   Indication: Patient presents with symptomatic varicose veins of the bilateral  lower extremity.   Procedure: Sclerotherapy using hypertonic saline mixed with 1% Lidocaine was performed on the bilateral lower extremity. Compression wraps were placed. The patient tolerated the procedure well. 

## 2019-04-30 NOTE — Progress Notes (Deleted)
SUBJECTIVE:  Patient ID: Paula Klein, female    DOB: 02-Mar-1946, 73 y.o.   MRN: [id number] Chief Complaint  Patient presents with  . Follow-up    sclero    HPI  Paula Klein is a 74 y.o. female   Past Medical History:  Diagnosis Date  . H/O blood clots   . Hypertension     Past Surgical History:  Procedure Laterality Date  . COLONOSCOPY WITH PROPOFOL N/A 09/26/2015   Procedure: COLONOSCOPY WITH PROPOFOL;  Surgeon: Lollie Sails, MD;  Location: [facility] ENDOSCOPY;  Service: Endoscopy;  Laterality: N/A;  . NO PAST SURGERIES      Social History   Socioeconomic History  . Marital status: Widowed    Spouse name: Not on file  . Number of children: Not on file  . Years of education: Not on file  . Highest education level: Not on file  Occupational History  . Not on file  Tobacco Use  . Smoking status: Former Smoker    Packs/day: 3.00    Years: 30.00    Pack years: 90.00    Quit date: 02/03/1989    Years since quitting: 30.2  . Smokeless tobacco: Never Used  Substance and Sexual Activity  . Alcohol use: No  . Drug use: No  . Sexual activity: Not on file  Other Topics Concern  . Not on file  Social History Narrative  . Not on file   Social Determinants of Health   Financial Resource Strain:   . Difficulty of Paying Living Expenses: Not on file  Food Insecurity:   . Worried About Charity fundraiser in the Last Year: Not on file  . Ran Out of Food in the Last Year: Not on file  Transportation Needs:   . Lack of Transportation (Medical): Not on file  . Lack of Transportation (Non-Medical): Not on file  Physical Activity:   . Days of Exercise per Week: Not on file  . Minutes of Exercise per Session: Not on file  Stress:   . Feeling of Stress : Not on file  Social Connections:   . Frequency of Communication with Friends and Family: Not on file  . Frequency of Social Gatherings with Friends and Family: Not on file  . Attends Religious Services: Not on  file  . Active Member of Clubs or Organizations: Not on file  . Attends Archivist Meetings: Not on file  . Marital Status: Not on file  Intimate Partner Violence:   . Fear of Current or Ex-Partner: Not on file  . Emotionally Abused: Not on file  . Physically Abused: Not on file  . Sexually Abused: Not on file    Family History  Problem Relation Age of Onset  . Varicose Veins Mother   . Heart attack Father   . Cancer Brother     Allergies  Allergen Reactions  . Pine Tar Cough  . Sulfa Antibiotics Rash  . Tetracyclines & Related Rash     Review of Systems   Review of Systems: Negative Unless Checked Constitutional: [] Weight loss  [] Fever  [] Chills Cardiac: [] Chest pain   []  Atrial Fibrillation  [] Palpitations   [] Shortness of breath when laying flat   [] Shortness of breath with exertion. [] Shortness of breath at rest Vascular:  [] Pain in legs with walking   [] Pain in legs with standing [] Pain in legs when laying flat   [] Claudication    [] Pain in feet when laying flat    []   History of DVT   [] Phlebitis   [] Swelling in legs   [] Varicose veins   [] Non-healing ulcers Pulmonary:   [] Uses home oxygen   [] Productive cough   [] Hemoptysis   [] Wheeze  [] COPD   [] Asthma Neurologic:  [] Dizziness   [] Seizures  [] Blackouts [] History of stroke   [] History of TIA  [] Aphasia   [] Temporary Blindness   [] Weakness or numbness in arm   [] Weakness or numbness in leg Musculoskeletal:   [] Joint swelling   [] Joint pain   [] Low back pain  []  History of Knee Replacement [] Arthritis [] back Surgeries  []  Spinal Stenosis    Hematologic:  [] Easy bruising  [] Easy bleeding   [] Hypercoagulable state   [] Anemic Gastrointestinal:  [] Diarrhea   [] Vomiting  [] Gastroesophageal reflux/heartburn   [] Difficulty swallowing. [] Abdominal pain Genitourinary:  [] Chronic kidney disease   [] Difficult urination  [] Anuric   [] Blood in urine [] Frequent urination  [] Burning with urination   [] Hematuria Skin:  [] Rashes    [] Ulcers [] Wounds Psychological:  [] History of anxiety   []  History of major depression  []  Memory Difficulties      OBJECTIVE:   Physical Exam  BP 124/75   Pulse 66   Resp 10   Ht 5\' 1"  (1.549 m)   Wt 163 lb (73.9 kg)   BMI 30.80 kg/m   Gen: WD/WN, NAD Head: [place]/AT, No temporalis wasting.  Ear/Nose/Throat: Hearing grossly intact, nares w/o erythema or drainage Eyes: PER, EOMI, sclera nonicteric.  Neck: Supple, no masses.  No JVD.  Pulmonary:  Good air movement, no use of accessory muscles.  Cardiac: RRR Vascular:  Vessel Right Left  Radial Palpable Palpable  Brachial Palpable Palpable  Femoral Palpable Palpable  Popliteal Palpable Palpable  Dorsalis Pedis Palpable Palpable  Posterior Tibial Palpable Palpable   Gastrointestinal: soft, non-distended. No guarding/no peritoneal signs.  Musculoskeletal: M/S 5/5 throughout.  No deformity or atrophy.  Neurologic: Pain and light touch intact in extremities.  Symmetrical.  Speech is fluent. Motor exam as listed above. Psychiatric: Judgment intact, Mood & affect appropriate for pt's clinical situation. Dermatologic: No Venous rashes. No Ulcers Noted.  No changes consistent with cellulitis. Lymph : No Cervical lymphadenopathy, no lichenification or skin changes of chronic lymphedema.       ASSESSMENT AND PLAN:  There are no diagnoses linked to this encounter.  Current Outpatient Medications on File Prior to Visit  Medication Sig Dispense Refill  . albuterol (PROVENTIL HFA;VENTOLIN HFA) 108 (90 Base) MCG/ACT inhaler Inhale 2 puffs into the lungs every 6 (six) hours as needed for shortness of breath.    . Ascorbic Acid (VITAMIN C) 1000 MG tablet Take 1,000 mg by mouth daily.     Marland Kitchen atenolol (TENORMIN) 25 MG tablet Take 25 mg by mouth daily.    . calcium carbonate (OS-CAL - DOSED IN MG OF ELEMENTAL CALCIUM) 1250 (500 Ca) MG tablet Take 1 tablet by mouth daily.    . Cholecalciferol (VITAMIN D3 PO) Take 500 Units by mouth daily.      . clonazePAM (KLONOPIN) 0.5 MG tablet Take 0.5 mg by mouth daily as needed for anxiety.    Marland Kitchen ELIQUIS 5 MG TABS tablet Take 5 mg by mouth 2 (two) times daily.     Marland Kitchen estradiol (ESTRACE) 0.5 MG tablet Take 0.5 mg by mouth daily.    . fluticasone furoate-vilanterol (BREO ELLIPTA) 100-25 MCG/INH AEPB Inhale 1 puff into the lungs daily. 60 each 5  . glucosamine-chondroitin 500-400 MG tablet Take 1 tablet by mouth 2 (two) times daily.     Marland Kitchen  lisinopril-hydrochlorothiazide (PRINZIDE,ZESTORETIC) 20-12.5 MG tablet Take 1 tablet by mouth 2 (two) times daily.     . magnesium oxide (MAG-OX) 400 MG tablet Take 400 mg by mouth daily.     . medroxyPROGESTERone (PROVERA) 2.5 MG tablet Take 2.5 mg by mouth daily.    . Omega-3 Fatty Acids (FISH OIL PO) Take 1,200 mg by mouth daily.     . quiNINE (QUALAQUIN) 324 MG capsule     . tiotropium (SPIRIVA) 18 MCG inhalation capsule Place 18 mcg into inhaler and inhale daily.    . vitamin E 1000 UNIT capsule Take 1,000 Units by mouth daily.    Marland Kitchen loratadine (CLARITIN) 10 MG tablet Take 10 mg by mouth daily.    . polyethylene glycol powder (GLYCOLAX/MIRALAX) 17 GM/SCOOP powder Take by mouth.     No current facility-administered medications on file prior to visit.    There are no Patient Instructions on file for this visit. No follow-ups on file.   Kris Hartmann, NP  This note was completed with Sales executive.  Any errors are purely unintentional.

## 2019-05-17 ENCOUNTER — Ambulatory Visit (INDEPENDENT_AMBULATORY_CARE_PROVIDER_SITE_OTHER): Payer: Medicare HMO | Admitting: Nurse Practitioner

## 2019-05-17 ENCOUNTER — Other Ambulatory Visit: Payer: Self-pay

## 2019-05-17 VITALS — BP 158/81 | HR 65 | Resp 12 | Ht 61.0 in | Wt 160.0 lb

## 2019-05-17 DIAGNOSIS — I8312 Varicose veins of left lower extremity with inflammation: Secondary | ICD-10-CM

## 2019-05-17 DIAGNOSIS — I8311 Varicose veins of right lower extremity with inflammation: Secondary | ICD-10-CM | POA: Diagnosis not present

## 2019-05-18 ENCOUNTER — Encounter (INDEPENDENT_AMBULATORY_CARE_PROVIDER_SITE_OTHER): Payer: Self-pay | Admitting: Nurse Practitioner

## 2019-05-18 NOTE — Progress Notes (Signed)
Varicose veins of bilateral  lower extremity with inflammation (454.1  I83.10) Current Plans   Indication: Patient presents with symptomatic varicose veins of the bilateral  lower extremity.   Procedure: Sclerotherapy using hypertonic saline mixed with 1% Lidocaine was performed on the bilateral lower extremity. Compression wraps were placed. The patient tolerated the procedure well. 

## 2019-06-14 ENCOUNTER — Ambulatory Visit (INDEPENDENT_AMBULATORY_CARE_PROVIDER_SITE_OTHER): Payer: Medicare HMO | Admitting: Nurse Practitioner

## 2019-06-14 ENCOUNTER — Encounter (INDEPENDENT_AMBULATORY_CARE_PROVIDER_SITE_OTHER): Payer: Self-pay | Admitting: Nurse Practitioner

## 2019-06-14 ENCOUNTER — Other Ambulatory Visit: Payer: Self-pay

## 2019-06-14 VITALS — BP 138/77 | HR 74 | Resp 16 | Wt 161.0 lb

## 2019-06-14 DIAGNOSIS — I8312 Varicose veins of left lower extremity with inflammation: Secondary | ICD-10-CM | POA: Diagnosis not present

## 2019-06-14 DIAGNOSIS — I8311 Varicose veins of right lower extremity with inflammation: Secondary | ICD-10-CM

## 2019-06-15 ENCOUNTER — Encounter (INDEPENDENT_AMBULATORY_CARE_PROVIDER_SITE_OTHER): Payer: Self-pay | Admitting: Nurse Practitioner

## 2019-06-15 NOTE — Progress Notes (Signed)
Varicose veins of bilateral lower extremity with inflammation (454.1  I83.10) Current Plans   Indication: Patient presents with symptomatic varicose veins of the bilateral lower extremity.   Procedure: Sclerotherapy using hypertonic saline mixed with 1% Lidocaine was performed on the bilateral lower extremity. Compression wraps were placed. The patient tolerated the procedure well.  The patient recently had a large varicosity popup with worsening of pain over these varicosities.  We will plan for bilateral venous reflux to see if her ablation has recannulized or if there are new accessory veins.

## 2019-06-21 ENCOUNTER — Other Ambulatory Visit (INDEPENDENT_AMBULATORY_CARE_PROVIDER_SITE_OTHER): Payer: Self-pay | Admitting: Nurse Practitioner

## 2019-06-21 DIAGNOSIS — I83813 Varicose veins of bilateral lower extremities with pain: Secondary | ICD-10-CM

## 2019-06-23 ENCOUNTER — Ambulatory Visit (INDEPENDENT_AMBULATORY_CARE_PROVIDER_SITE_OTHER): Payer: Medicare HMO | Admitting: Nurse Practitioner

## 2019-06-23 ENCOUNTER — Ambulatory Visit (INDEPENDENT_AMBULATORY_CARE_PROVIDER_SITE_OTHER): Payer: Medicare HMO

## 2019-06-23 ENCOUNTER — Encounter (INDEPENDENT_AMBULATORY_CARE_PROVIDER_SITE_OTHER): Payer: Self-pay | Admitting: Nurse Practitioner

## 2019-06-23 ENCOUNTER — Other Ambulatory Visit: Payer: Self-pay

## 2019-06-23 VITALS — BP 148/81 | HR 66 | Resp 16 | Wt 158.0 lb

## 2019-06-23 DIAGNOSIS — I83813 Varicose veins of bilateral lower extremities with pain: Secondary | ICD-10-CM

## 2019-06-23 DIAGNOSIS — I8311 Varicose veins of right lower extremity with inflammation: Secondary | ICD-10-CM | POA: Diagnosis not present

## 2019-06-23 DIAGNOSIS — I8312 Varicose veins of left lower extremity with inflammation: Secondary | ICD-10-CM | POA: Diagnosis not present

## 2019-06-28 ENCOUNTER — Encounter (INDEPENDENT_AMBULATORY_CARE_PROVIDER_SITE_OTHER): Payer: Self-pay | Admitting: Nurse Practitioner

## 2019-06-28 NOTE — Progress Notes (Signed)
Subjective:    Patient ID: Paula Klein, female    DOB: 03-13-1945, 74 y.o.   MRN: [id number] Chief Complaint  Patient presents with  . Follow-up    ultrasound follow up    The patient returns to the office for followup status post laser ablation of the bilateral great saphenous veins in 2019.  Initially, the patient noted significant improvement in the lower extremity pain with resolution of the symptoms after undergoing sclerotherapy. The patient notes multiple residual varicosities bilaterally which continued to hurt with dependent positions and remained tender to palpation but with sclerotherapy these had greatly improved, but recently the patient noted having increased pain and discomfort in her bilateral lower extremities.  The right lower extremity recently had a sudden pain that was sharp and shooting and she noticed the development of a new large varicosity.  The patient also notes that on her left lower extremity the varicosities that went away after her endovenous laser ablation suddenly were.  She began having symptoms all over again. T The patient continues to wear graduated compression stockings on a daily basis but these are not eliminating the pain and discomfort. The patient continues to use over-the-counter anti-inflammatory medications to treat the pain and related symptoms but this has not given the patient relief. The patient notes the pain in the lower extremities is causing problems with daily exercise, problems at work and even with household activities such as preparing meals and doing dishes.  Today noninvasive studies show chronic venous insufficiency in the right lower extremity in the common femoral vein, mid femoral vein as well as the popliteal vein.  There is also reflux in the great saphenous vein at the saphenofemoral junction.  The rest of the great saphenous vein shows evidence of a prior ablation and stripping.  However there is an accessory saphenous vein with  significant reflux greater than 4 seconds measuring 0.63 cm however it is also tortuous.  The left lower extremity has evidence of reflux in the great saphenous vein at the saphenofemoral junction extending to the knee with reflux times greater than 5 seconds and vein diameters measure from 0.55 cm to 1.08.   Review of Systems  Cardiovascular:       Varicose veins  All other systems reviewed and are negative.      Objective:   Physical Exam Vitals reviewed.  HENT:     Head: Normocephalic.  Cardiovascular:     Comments: Large varicosities bilaterally each 5 to 6 mm Pulmonary:     Effort: Pulmonary effort is normal.     Breath sounds: Normal breath sounds.  Musculoskeletal:        General: Normal range of motion.  Skin:    General: Skin is warm and dry.  Neurological:     Mental Status: She is alert and oriented to person, place, and time.  Psychiatric:        Mood and Affect: Mood normal.        Behavior: Behavior normal.        Thought Content: Thought content normal.        Judgment: Judgment normal.     BP (!) 148/81 (BP Location: Right Arm)   Pulse 66   Resp 16   Wt 158 lb (71.7 kg)   BMI 29.85 kg/m   Past Medical History:  Diagnosis Date  . H/O blood clots   . Hypertension     Social History   Socioeconomic History  . Marital status: Widowed  Spouse name: Not on file  . Number of children: Not on file  . Years of education: Not on file  . Highest education level: Not on file  Occupational History  . Not on file  Tobacco Use  . Smoking status: Former Smoker    Packs/day: 3.00    Years: 30.00    Pack years: 90.00    Quit date: 02/03/1989    Years since quitting: 30.4  . Smokeless tobacco: Never Used  Substance and Sexual Activity  . Alcohol use: No  . Drug use: No  . Sexual activity: Not on file  Other Topics Concern  . Not on file  Social History Narrative  . Not on file   Social Determinants of Health   Financial Resource Strain:   .  Difficulty of Paying Living Expenses:   Food Insecurity:   . Worried About Charity fundraiser in the Last Year:   . Arboriculturist in the Last Year:   Transportation Needs:   . Film/video editor (Medical):   Marland Kitchen Lack of Transportation (Non-Medical):   Physical Activity:   . Days of Exercise per Week:   . Minutes of Exercise per Session:   Stress:   . Feeling of Stress :   Social Connections:   . Frequency of Communication with Friends and Family:   . Frequency of Social Gatherings with Friends and Family:   . Attends Religious Services:   . Active Member of Clubs or Organizations:   . Attends Archivist Meetings:   Marland Kitchen Marital Status:   Intimate Partner Violence:   . Fear of Current or Ex-Partner:   . Emotionally Abused:   Marland Kitchen Physically Abused:   . Sexually Abused:     Past Surgical History:  Procedure Laterality Date  . COLONOSCOPY WITH PROPOFOL N/A 09/26/2015   Procedure: COLONOSCOPY WITH PROPOFOL;  Surgeon: Lollie Sails, MD;  Location: [facility] ENDOSCOPY;  Service: Endoscopy;  Laterality: N/A;  . NO PAST SURGERIES      Family History  Problem Relation Age of Onset  . Varicose Veins Mother   . Heart attack Father   . Cancer Brother     Allergies  Allergen Reactions  . Pine Tar Cough  . Sulfa Antibiotics Rash  . Tetracyclines & Related Rash       Assessment & Plan:   1. Varicose veins of both lower extremities with inflammation Recommend  I have reviewed my previous  discussion with the patient regarding  varicose veins and why they cause symptoms. Patient will continue  wearing graduated compression stockings class 1 on a daily basis, beginning first thing in the morning and removing them in the evening.    In addition, behavioral modification including elevation during the day was again discussed and this will continue.  The patient has utilized over the counter pain medications and has been exercising.  However, at this time conservative  therapy has not alleviated the patient's symptoms of leg pain and swelling  Recommend: laser ablation of the left great saphenous veins to eliminate the symptoms of pain and swelling of the lower extremities caused by the severe superficial venous reflux disease.    Current Outpatient Medications on File Prior to Visit  Medication Sig Dispense Refill  . albuterol (PROVENTIL HFA;VENTOLIN HFA) 108 (90 Base) MCG/ACT inhaler Inhale 2 puffs into the lungs every 6 (six) hours as needed for shortness of breath.    . Ascorbic Acid (VITAMIN C) 1000 MG tablet Take  1,000 mg by mouth daily.     Marland Kitchen atenolol (TENORMIN) 25 MG tablet Take 25 mg by mouth daily.    . calcium carbonate (OS-CAL - DOSED IN MG OF ELEMENTAL CALCIUM) 1250 (500 Ca) MG tablet Take 1 tablet by mouth daily.    . Cholecalciferol (VITAMIN D3 PO) Take 500 Units by mouth daily.    . clonazePAM (KLONOPIN) 0.5 MG tablet Take 0.5 mg by mouth daily as needed for anxiety.    Marland Kitchen ELIQUIS 5 MG TABS tablet Take 5 mg by mouth 2 (two) times daily.     Marland Kitchen estradiol (ESTRACE) 0.5 MG tablet Take 0.5 mg by mouth daily.    . fluticasone furoate-vilanterol (BREO ELLIPTA) 100-25 MCG/INH AEPB Inhale 1 puff into the lungs daily. 60 each 5  . glucosamine-chondroitin 500-400 MG tablet Take 1 tablet by mouth 2 (two) times daily.     Marland Kitchen lisinopril-hydrochlorothiazide (PRINZIDE,ZESTORETIC) 20-12.5 MG tablet Take 1 tablet by mouth 2 (two) times daily.     Marland Kitchen loratadine (CLARITIN) 10 MG tablet Take 10 mg by mouth daily.    . magnesium oxide (MAG-OX) 400 MG tablet Take 400 mg by mouth daily.     . medroxyPROGESTERone (PROVERA) 2.5 MG tablet Take 2.5 mg by mouth daily.    . Omega-3 Fatty Acids (FISH OIL PO) Take 1,200 mg by mouth daily.     . polyethylene glycol powder (GLYCOLAX/MIRALAX) 17 GM/SCOOP powder Take by mouth.    . quiNINE (QUALAQUIN) 324 MG capsule     . tiotropium (SPIRIVA) 18 MCG inhalation capsule Place 18 mcg into inhaler and inhale daily.    . vitamin E  1000 UNIT capsule Take 1,000 Units by mouth daily.     No current facility-administered medications on file prior to visit.    There are no Patient Instructions on file for this visit. No follow-ups on file.   Kris Hartmann, NP

## 2019-07-12 ENCOUNTER — Ambulatory Visit (INDEPENDENT_AMBULATORY_CARE_PROVIDER_SITE_OTHER): Payer: Medicare HMO | Admitting: Nurse Practitioner

## 2019-07-26 ENCOUNTER — Ambulatory Visit (INDEPENDENT_AMBULATORY_CARE_PROVIDER_SITE_OTHER): Payer: Medicare HMO | Admitting: Nurse Practitioner

## 2019-07-26 ENCOUNTER — Encounter (INDEPENDENT_AMBULATORY_CARE_PROVIDER_SITE_OTHER): Payer: Self-pay | Admitting: Nurse Practitioner

## 2019-07-26 ENCOUNTER — Other Ambulatory Visit: Payer: Self-pay

## 2019-07-26 VITALS — BP 136/80 | HR 72 | Ht 61.0 in | Wt 159.0 lb

## 2019-07-26 DIAGNOSIS — I8312 Varicose veins of left lower extremity with inflammation: Secondary | ICD-10-CM

## 2019-07-26 DIAGNOSIS — I8311 Varicose veins of right lower extremity with inflammation: Secondary | ICD-10-CM

## 2019-07-26 NOTE — Progress Notes (Signed)
Varicose veins of bilateral  lower extremity with inflammation (454.1  I83.10) Current Plans   Indication: Patient presents with symptomatic varicose veins of the bilateral  lower extremities.   Procedure: Sclerotherapy using hypertonic saline mixed with 1% Lidocaine was performed on the bilateral lower extremities. Compression wraps were placed. The patient tolerated the procedure well.

## 2019-08-21 ENCOUNTER — Other Ambulatory Visit: Payer: Self-pay

## 2019-08-21 DIAGNOSIS — Z7901 Long term (current) use of anticoagulants: Secondary | ICD-10-CM | POA: Insufficient documentation

## 2019-08-21 DIAGNOSIS — I10 Essential (primary) hypertension: Secondary | ICD-10-CM | POA: Diagnosis not present

## 2019-08-21 DIAGNOSIS — Z79899 Other long term (current) drug therapy: Secondary | ICD-10-CM | POA: Insufficient documentation

## 2019-08-21 DIAGNOSIS — I83891 Varicose veins of right lower extremities with other complications: Secondary | ICD-10-CM | POA: Insufficient documentation

## 2019-08-21 NOTE — ED Triage Notes (Signed)
Patient reports she is having work on the veins in her legs.  There was a scab on right lower leg and she hit it while shaving her legs and it was bleeding.  Area is bandaged with no leakage noted.

## 2019-08-22 ENCOUNTER — Emergency Department
Admission: EM | Admit: 2019-08-22 | Discharge: 2019-08-22 | Disposition: A | Discharge status: Home / Self Care | Payer: Medicare HMO | Attending: Emergency Medicine | Admitting: Emergency Medicine

## 2019-08-22 DIAGNOSIS — I83899 Varicose veins of unspecified lower extremities with other complications: Secondary | ICD-10-CM

## 2019-08-22 NOTE — ED Provider Notes (Signed)
[facility] Emergency Department Provider Note ____________________________________________  Time seen: 0705  I have reviewed the triage vital signs and the nursing notes.  HISTORY  Chief Complaint  Laceration   HPI Paula Klein is a 74 y.o. female presents to the ER today with complaint of bleeding varicose vein of her right lower extremity.  She reports approximately 10:00 last night she was shaving, when she shaved a scabbed off of varicose vein.  She reports the area was squirting blood.  EMS was called and they placed a dressing to her right lower extremity.  She denies active bleeding, pain, redness, swelling or warmth of the right lower extremity.  She reports she is currently working with [facility].  Past Medical History:  Diagnosis Date  . H/O blood clots   . Hypertension     Patient Active Problem List   Diagnosis Date Noted  . Acute bronchitis 03/02/2018  . Varicose veins of both lower extremities with inflammation 08/31/2017  . Chronic venous insufficiency 08/31/2017  . Swelling of limb 08/31/2017    Past Surgical History:  Procedure Laterality Date  . COLONOSCOPY WITH PROPOFOL N/A 09/26/2015   Procedure: COLONOSCOPY WITH PROPOFOL;  Surgeon: Lollie Sails, MD;  Location: [facility] ENDOSCOPY;  Service: Endoscopy;  Laterality: N/A;  . NO PAST SURGERIES      Prior to Admission medications   Medication Sig Start Date End Date Taking? Authorizing Provider  albuterol (PROVENTIL HFA;VENTOLIN HFA) 108 (90 Base) MCG/ACT inhaler Inhale 2 puffs into the lungs every 6 (six) hours as needed for shortness of breath.    [provider]  Ascorbic Acid (VITAMIN C) 1000 MG tablet Take 1,000 mg by mouth daily.     [provider]  atenolol (TENORMIN) 25 MG tablet Take 25 mg by mouth daily.    [provider]  calcium carbonate (OS-CAL - DOSED IN MG OF ELEMENTAL CALCIUM) 1250 (500 Ca) MG tablet Take 1 tablet by mouth  daily.    [provider]  Cholecalciferol (VITAMIN D3 PO) Take 500 Units by mouth daily.    [provider]  clonazePAM (KLONOPIN) 0.5 MG tablet Take 0.5 mg by mouth daily as needed for anxiety.    [provider]  ELIQUIS 5 MG TABS tablet Take 5 mg by mouth 2 (two) times daily.  08/07/17   [provider]  estradiol (ESTRACE) 0.5 MG tablet Take 0.5 mg by mouth daily.    [provider]  fluticasone furoate-vilanterol (BREO ELLIPTA) 100-25 MCG/INH AEPB Inhale 1 puff into the lungs daily. 08/17/18   Wilhelmina Mcardle, MD  glucosamine-chondroitin 500-400 MG tablet Take 1 tablet by mouth 2 (two) times daily.     [provider]  lisinopril-hydrochlorothiazide (PRINZIDE,ZESTORETIC) 20-12.5 MG tablet Take 1 tablet by mouth 2 (two) times daily.     [provider]  loratadine (CLARITIN) 10 MG tablet Take 10 mg by mouth daily.    [provider]  magnesium oxide (MAG-OX) 400 MG tablet Take 400 mg by mouth daily.     [provider]  medroxyPROGESTERone (PROVERA) 2.5 MG tablet Take 2.5 mg by mouth daily.    [provider]  Omega-3 Fatty Acids (FISH OIL PO) Take 1,200 mg by mouth daily.     [provider]  polyethylene glycol powder (GLYCOLAX/MIRALAX) 17 GM/SCOOP powder Take by mouth.    [provider]  quiNINE Orlin Hilding) 324 MG capsule  03/23/19   [provider]  tiotropium (  SPIRIVA) 18 MCG inhalation capsule Place 18 mcg into inhaler and inhale daily.    [provider]  vitamin E 1000 UNIT capsule Take 1,000 Units by mouth daily.    [provider]    Allergies Pine tar, Sulfa antibiotics, and Tetracyclines & related  Family History  Problem Relation Age of Onset  . Varicose Veins Mother   . Heart attack Father   . Cancer Brother     Social History Social History   Tobacco Use  . Smoking status: Former Smoker    Packs/day: 3.00    Years: 30.00    Pack  years: 90.00    Quit date: 02/03/1989    Years since quitting: 30.5  . Smokeless tobacco: Never Used  Vaping Use  . Vaping Use: Never used  Substance Use Topics  . Alcohol use: No  . Drug use: No    Review of Systems  Constitutional: Negative for fever, chills or body. Cardiovascular: Negative for chest pain or chest tightness. Respiratory: Negative for cough or shortness of breath. Skin: Positive for varicose vein to BLE. Neurological: Negative for focal weakness, tingling or numbness. ____________________________________________  PHYSICAL EXAM:  VITAL SIGNS: ED Triage Vitals  Enc Vitals Group     BP 08/21/19 2355 (!) 149/78     Pulse Rate 08/21/19 2355 71     Resp 08/21/19 2355 17     Temp 08/21/19 2355 98.6 F (37 C)     Temp Source 08/21/19 2355 Oral     SpO2 08/21/19 2355 95 %     Weight 08/21/19 2353 156 lb (70.8 kg)     Height 08/21/19 2353 5\' 1"  (1.549 m)     Head Circumference --      Peak Flow --      Pain Score 08/21/19 2352 0     Pain Loc --      Pain Edu? --      Excl. in [place]? --     Constitutional: Alert and oriented. Well appearing and in no distress. Cardiovascular: Normal rate, regular rhythm with occasional extra beat.  No murmur noted.  Pedal pulses 2+ bilaterally Respiratory: Normal respiratory effort. No wheezes/rales/rhonchi. Musculoskeletal: Gait slow and steady without device. Neurologic:  Normal gait without ataxia. Normal speech and language. No gross focal neurologic deficits are appreciated. Skin:  Skin is warm, dry and intact.  Varicose veins noted of BLE.  No active bleeding, redness, warmth noted. ____________________________________________  INITIAL IMPRESSION / ASSESSMENT AND PLAN / ED COURSE Bleeding Varicose Vein:  Resolved without intervention Advised her if reoccurs, to apply pressure until bleeding has stopped. Follow-up with [facility] as an outpatient ____________________________________________  FINAL  CLINICAL IMPRESSION(S) / ED DIAGNOSES  Final diagnoses:  Bleeding from varicose vein      Jearld Fenton, NP 08/22/19 8110    Earleen Newport, MD 08/23/19 1506

## 2019-08-22 NOTE — Discharge Instructions (Addendum)
You were seen today for bleeding varicose veins.  The bleeding had stopped prior to your assessment.  Please be careful shaving.  If bleeding recurs, apply pressure until the bleeding has stopped.  Please follow-up with Dr. Delana Meyer at [facility] as needed if symptoms persist or worsen.

## 2019-08-22 NOTE — ED Notes (Signed)
Pt verbalized understanding of discharge instructions. NAD at this time. 

## 2019-08-22 NOTE — ED Notes (Signed)
Patient to stat desk in no acute distress asking about wait time. Patient given update on wait time. Patient verbalizes understanding.  

## 2019-08-22 NOTE — ED Notes (Addendum)
Pt with c/o of cut to right leg. Pt states she hit a varicose vein while shaving and couldn't get it stop bleeding. Pt also states she began "hyperventilating" and that she has had issues with her breathing in the past. Bleeding is controlled at this time and pt with even unlabored breathing and speaking in full sentences.

## 2019-08-31 ENCOUNTER — Telehealth (INDEPENDENT_AMBULATORY_CARE_PROVIDER_SITE_OTHER): Payer: Self-pay

## 2019-08-31 NOTE — Telephone Encounter (Signed)
Patient called asking if she should stop her Eliquis for her laser procedure in our office on 09/03/19. Per Eulogio Ditch the patient should stop her morning dose of Eliquis only.

## 2019-09-03 ENCOUNTER — Ambulatory Visit (INDEPENDENT_AMBULATORY_CARE_PROVIDER_SITE_OTHER): Payer: Medicare HMO | Admitting: Vascular Surgery

## 2019-09-03 ENCOUNTER — Other Ambulatory Visit: Payer: Self-pay

## 2019-09-03 ENCOUNTER — Encounter (INDEPENDENT_AMBULATORY_CARE_PROVIDER_SITE_OTHER): Payer: Self-pay | Admitting: Vascular Surgery

## 2019-09-03 VITALS — BP 156/76 | HR 65 | Ht 61.0 in | Wt 158.0 lb

## 2019-09-03 DIAGNOSIS — I8312 Varicose veins of left lower extremity with inflammation: Secondary | ICD-10-CM | POA: Diagnosis not present

## 2019-09-03 DIAGNOSIS — I8311 Varicose veins of right lower extremity with inflammation: Secondary | ICD-10-CM | POA: Diagnosis not present

## 2019-09-03 NOTE — Progress Notes (Signed)
Paula Klein is a 74 y.o. female who presents with symptomatic venous reflux  Past Medical History:  Diagnosis Date  . H/O blood clots   . Hypertension     Past Surgical History:  Procedure Laterality Date  . COLONOSCOPY WITH PROPOFOL N/A 09/26/2015   Procedure: COLONOSCOPY WITH PROPOFOL;  Surgeon: Lollie Sails, MD;  Location: [facility] ENDOSCOPY;  Service: Endoscopy;  Laterality: N/A;  . NO PAST SURGERIES       Current Outpatient Medications:  .  albuterol (PROVENTIL HFA;VENTOLIN HFA) 108 (90 Base) MCG/ACT inhaler, Inhale 2 puffs into the lungs every 6 (six) hours as needed for shortness of breath., Disp: , Rfl:  .  Ascorbic Acid (VITAMIN C) 1000 MG tablet, Take 1,000 mg by mouth daily. , Disp: , Rfl:  .  atenolol (TENORMIN) 25 MG tablet, Take 25 mg by mouth daily., Disp: , Rfl:  .  calcium carbonate (OS-CAL - DOSED IN MG OF ELEMENTAL CALCIUM) 1250 (500 Ca) MG tablet, Take 1 tablet by mouth daily., Disp: , Rfl:  .  Cholecalciferol (VITAMIN D3 PO), Take 500 Units by mouth daily., Disp: , Rfl:  .  clonazePAM (KLONOPIN) 0.5 MG tablet, Take 0.5 mg by mouth daily as needed for anxiety., Disp: , Rfl:  .  ELIQUIS 5 MG TABS tablet, Take 5 mg by mouth 2 (two) times daily. , Disp: , Rfl:  .  estradiol (ESTRACE) 0.5 MG tablet, Take 0.5 mg by mouth daily., Disp: , Rfl:  .  fluticasone furoate-vilanterol (BREO ELLIPTA) 100-25 MCG/INH AEPB, Inhale 1 puff into the lungs daily., Disp: 60 each, Rfl: 5 .  glucosamine-chondroitin 500-400 MG tablet, Take 1 tablet by mouth 2 (two) times daily. , Disp: , Rfl:  .  lisinopril-hydrochlorothiazide (PRINZIDE,ZESTORETIC) 20-12.5 MG tablet, Take 1 tablet by mouth 2 (two) times daily. , Disp: , Rfl:  .  loratadine (CLARITIN) 10 MG tablet, Take 10 mg by mouth daily., Disp: , Rfl:  .  magnesium oxide (MAG-OX) 400 MG tablet, Take 400 mg by mouth daily. , Disp: , Rfl:  .  medroxyPROGESTERone (PROVERA) 2.5 MG tablet, Take 2.5 mg by mouth daily., Disp: , Rfl:  .   Omega-3 Fatty Acids (FISH OIL PO), Take 1,200 mg by mouth daily. , Disp: , Rfl:  .  polyethylene glycol powder (GLYCOLAX/MIRALAX) 17 GM/SCOOP powder, Take by mouth., Disp: , Rfl:  .  quiNINE (QUALAQUIN) 324 MG capsule, , Disp: , Rfl:  .  tiotropium (SPIRIVA) 18 MCG inhalation capsule, Place 18 mcg into inhaler and inhale daily., Disp: , Rfl:  .  vitamin E 1000 UNIT capsule, Take 1,000 Units by mouth daily., Disp: , Rfl:  .  ALPRAZolam (XANAX) 0.5 MG tablet, Take by mouth., Disp: , Rfl:   Allergies  Allergen Reactions  . Pine Tar Cough  . Sulfa Antibiotics Rash  . Tetracyclines & Related Rash     Varicose veins of both lower extremities with inflammation     PLAN: The patient's left lower extremity was sterilely prepped and draped. The ultrasound machine was used to visualize the saphenous vein throughout its course. A segment in the mid to upper calf was selected for access. The saphenous vein was accessed without difficulty using ultrasound guidance with a micropuncture needle. A 0.018 wire was then placed beyond the saphenofemoral junction and the needle was removed. The 65 cm sheath was then placed over the wire and the wire and dilator were removed. The laser fiber was then placed through the sheath and its tip  was placed approximately 4-5 centimeters below the saphenofemoral junction. Tumescent anesthesia was then created with a dilute lidocaine solution. Laser energy was then delivered with constant withdrawal of the sheath and laser fiber. Approximately 1559 joules of energy were delivered over a length of 39 centimeters using a 1470 Hz VenaCure machine at 7 W. Sterile dressings were placed. The patient tolerated the procedure well without obvious complications.   Follow-up in 1 week with post-laser duplex.

## 2019-09-07 ENCOUNTER — Other Ambulatory Visit (INDEPENDENT_AMBULATORY_CARE_PROVIDER_SITE_OTHER): Payer: Self-pay | Admitting: Vascular Surgery

## 2019-09-07 ENCOUNTER — Ambulatory Visit (INDEPENDENT_AMBULATORY_CARE_PROVIDER_SITE_OTHER): Payer: Medicare HMO

## 2019-09-07 ENCOUNTER — Other Ambulatory Visit: Payer: Self-pay

## 2019-09-07 DIAGNOSIS — I8312 Varicose veins of left lower extremity with inflammation: Secondary | ICD-10-CM

## 2019-09-07 DIAGNOSIS — I8311 Varicose veins of right lower extremity with inflammation: Secondary | ICD-10-CM | POA: Diagnosis not present

## 2019-10-05 ENCOUNTER — Ambulatory Visit (INDEPENDENT_AMBULATORY_CARE_PROVIDER_SITE_OTHER): Payer: Medicare HMO | Admitting: Nurse Practitioner

## 2019-12-03 ENCOUNTER — Ambulatory Visit (INDEPENDENT_AMBULATORY_CARE_PROVIDER_SITE_OTHER): Payer: Medicare HMO | Admitting: Nurse Practitioner

## 2019-12-03 ENCOUNTER — Encounter (INDEPENDENT_AMBULATORY_CARE_PROVIDER_SITE_OTHER): Payer: Self-pay | Admitting: Nurse Practitioner

## 2019-12-03 ENCOUNTER — Other Ambulatory Visit: Payer: Self-pay

## 2019-12-03 VITALS — BP 164/78 | HR 65 | Resp 16 | Wt 154.0 lb

## 2019-12-03 DIAGNOSIS — I8311 Varicose veins of right lower extremity with inflammation: Secondary | ICD-10-CM | POA: Diagnosis not present

## 2019-12-03 DIAGNOSIS — I8312 Varicose veins of left lower extremity with inflammation: Secondary | ICD-10-CM | POA: Diagnosis not present

## 2019-12-06 ENCOUNTER — Encounter (INDEPENDENT_AMBULATORY_CARE_PROVIDER_SITE_OTHER): Payer: Self-pay | Admitting: Nurse Practitioner

## 2019-12-06 NOTE — Progress Notes (Signed)
Subjective:    Patient ID: Paula Klein, female    DOB: 20-Apr-1945, 74 y.o.   MRN: [id number] Chief Complaint  Patient presents with  . Follow-up    4wk post laser follow up    The patient returns to the office for followup status post laser ablation of the left great saphenous vein on 09/03/2019. The patient notes multiple residual varicosities bilaterally which continued to hurt with dependent positions and remained tender to palpation.  Patient also notes that the right lower extremity continues to have the symptoms as well.  The patient's swelling is unchanged from preoperative status. The patient continues to wear graduated compression stockings on a daily basis but these are not eliminating the pain and discomfort. The patient continues to use over-the-counter anti-inflammatory medications to treat the pain and related symptoms but this has not given the patient relief. The patient notes the pain in the lower extremities is causing problems with daily exercise, problems at work and even with household activities such as preparing meals and doing dishes.  The patient also notes extensive bleeding from the right lower extremity recently which caused her to visit the emergency room for treatment.  The patient is otherwise done well and there have been no complications related to the laser procedure or interval changes in the patient's overall   Venous ultrasound post laser shows successful laser ablation of the left great saphenous vein, no DVT identified.  Previous noninvasive studies also showed reflux in a right accessory saphenous vein.  This vein measures 0.63 cm it was not amenable to endovenous laser ablation due to its extensive tortuosity.   Review of Systems  Cardiovascular: Positive for leg swelling.  Musculoskeletal: Positive for myalgias.  Hematological: Bruises/bleeds easily.  All other systems reviewed and are negative.      Objective:   Physical Exam Vitals reviewed.   HENT:     Head: Normocephalic.  Cardiovascular:     Rate and Rhythm: Normal rate and regular rhythm.     Pulses: Normal pulses.  Pulmonary:     Effort: Pulmonary effort is normal.  Skin:    General: Skin is warm and dry.  Neurological:     Mental Status: She is alert and oriented to person, place, and time.  Psychiatric:        Mood and Affect: Mood normal.        Behavior: Behavior normal.        Thought Content: Thought content normal.        Judgment: Judgment normal.     BP (!) 164/78 (BP Location: Right Arm)   Pulse 65   Resp 16   Wt 154 lb (69.9 kg)   BMI 29.10 kg/m   Past Medical History:  Diagnosis Date  . H/O blood clots   . Hypertension     Social History   Socioeconomic History  . Marital status: Widowed    Spouse name: Not on file  . Number of children: Not on file  . Years of education: Not on file  . Highest education level: Not on file  Occupational History  . Not on file  Tobacco Use  . Smoking status: Former Smoker    Packs/day: 3.00    Years: 30.00    Pack years: 90.00    Quit date: 02/03/1989    Years since quitting: 30.8  . Smokeless tobacco: Never Used  Vaping Use  . Vaping Use: Never used  Substance and Sexual Activity  . Alcohol use:  No  . Drug use: No  . Sexual activity: Not on file  Other Topics Concern  . Not on file  Social History Narrative  . Not on file   Social Determinants of Health   Financial Resource Strain:   . Difficulty of Paying Living Expenses: Not on file  Food Insecurity:   . Worried About Charity fundraiser in the Last Year: Not on file  . Ran Out of Food in the Last Year: Not on file  Transportation Needs:   . Lack of Transportation (Medical): Not on file  . Lack of Transportation (Non-Medical): Not on file  Physical Activity:   . Days of Exercise per Week: Not on file  . Minutes of Exercise per Session: Not on file  Stress:   . Feeling of Stress : Not on file  Social Connections:   . Frequency  of Communication with Friends and Family: Not on file  . Frequency of Social Gatherings with Friends and Family: Not on file  . Attends Religious Services: Not on file  . Active Member of Clubs or Organizations: Not on file  . Attends Archivist Meetings: Not on file  . Marital Status: Not on file  Intimate Partner Violence:   . Fear of Current or Ex-Partner: Not on file  . Emotionally Abused: Not on file  . Physically Abused: Not on file  . Sexually Abused: Not on file    Past Surgical History:  Procedure Laterality Date  . COLONOSCOPY WITH PROPOFOL N/A 09/26/2015   Procedure: COLONOSCOPY WITH PROPOFOL;  Surgeon: Lollie Sails, MD;  Location: [facility] ENDOSCOPY;  Service: Endoscopy;  Laterality: N/A;  . NO PAST SURGERIES      Family History  Problem Relation Age of Onset  . Varicose Veins Mother   . Heart attack Father   . Cancer Brother     Allergies  Allergen Reactions  . Pine Tar Cough  . Sulfa Antibiotics Rash  . Tetracyclines & Related Rash       Assessment & Plan:   1. Varicose veins of both lower extremities with inflammation Recommend:  The patient has had successful ablation of the previously incompetent saphenous venous system but still has persistent symptoms of pain and swelling that are having a negative impact on daily life and daily activities.  The patient also continues to have reflux and extended pain from a right accessory saphenous vein which is not amenable to laser due to its tortuosity  Patient should undergo foam injection sclerotherapy to treat the residual varicosities of the right lower extremity.  The risks, benefits and alternative therapies were reviewed in detail with the patient.  All questions were answered.  The patient agrees to proceed with sclerotherapy at their convenience.  The patient will continue wearing the graduated compression stockings and using the over-the-counter pain medications to treat her symptoms.         Current Outpatient Medications on File Prior to Visit  Medication Sig Dispense Refill  . albuterol (PROVENTIL HFA;VENTOLIN HFA) 108 (90 Base) MCG/ACT inhaler Inhale 2 puffs into the lungs every 6 (six) hours as needed for shortness of breath.    . ALPRAZolam (XANAX) 0.5 MG tablet Take by mouth.    . Ascorbic Acid (VITAMIN C) 1000 MG tablet Take 1,000 mg by mouth daily.     Marland Kitchen atenolol (TENORMIN) 25 MG tablet Take 25 mg by mouth daily.    . calcium carbonate (OS-CAL - DOSED IN MG OF ELEMENTAL CALCIUM) 1250 (  500 Ca) MG tablet Take 1 tablet by mouth daily.    . Cholecalciferol (VITAMIN D3 PO) Take 500 Units by mouth daily.    . clonazePAM (KLONOPIN) 0.5 MG tablet Take 0.5 mg by mouth daily as needed for anxiety.    Marland Kitchen estradiol (ESTRACE) 0.5 MG tablet Take 0.5 mg by mouth daily.    . fluticasone furoate-vilanterol (BREO ELLIPTA) 100-25 MCG/INH AEPB Inhale 1 puff into the lungs daily. 60 each 5  . glucosamine-chondroitin 500-400 MG tablet Take 1 tablet by mouth 2 (two) times daily.     Marland Kitchen lisinopril-hydrochlorothiazide (PRINZIDE,ZESTORETIC) 20-12.5 MG tablet Take 1 tablet by mouth 2 (two) times daily.     Marland Kitchen loratadine (CLARITIN) 10 MG tablet Take 10 mg by mouth daily.    . magnesium oxide (MAG-OX) 400 MG tablet Take 400 mg by mouth daily.     . medroxyPROGESTERone (PROVERA) 2.5 MG tablet Take 2.5 mg by mouth daily.    . Omega-3 Fatty Acids (FISH OIL PO) Take 1,200 mg by mouth daily.     . polyethylene glycol powder (GLYCOLAX/MIRALAX) 17 GM/SCOOP powder Take by mouth.    . quiNINE (QUALAQUIN) 324 MG capsule     . tiotropium (SPIRIVA) 18 MCG inhalation capsule Place 18 mcg into inhaler and inhale daily.    . vitamin E 1000 UNIT capsule Take 1,000 Units by mouth daily.    Marland Kitchen ELIQUIS 5 MG TABS tablet Take 5 mg by mouth 2 (two) times daily.  (Patient not taking: Reported on 12/03/2019)    . XARELTO 10 MG TABS tablet SMARTSIG:1 Tablet(s) By Mouth Every Evening     No current facility-administered  medications on file prior to visit.    There are no Patient Instructions on file for this visit. No follow-ups on file.   Kris Hartmann, NP

## 2019-12-26 ENCOUNTER — Emergency Department (HOSPITAL_COMMUNITY): Payer: Medicare HMO

## 2019-12-26 ENCOUNTER — Encounter (HOSPITAL_COMMUNITY): Payer: Self-pay | Admitting: *Deleted

## 2019-12-26 ENCOUNTER — Emergency Department (HOSPITAL_COMMUNITY)
Admission: EM | Admit: 2019-12-26 | Discharge: 2019-12-26 | Disposition: A | Discharge status: Home / Self Care | Payer: Medicare HMO | Attending: Emergency Medicine | Admitting: Emergency Medicine

## 2019-12-26 DIAGNOSIS — U071 COVID-19: Secondary | ICD-10-CM | POA: Diagnosis not present

## 2019-12-26 DIAGNOSIS — Z79899 Other long term (current) drug therapy: Secondary | ICD-10-CM | POA: Diagnosis not present

## 2019-12-26 DIAGNOSIS — I10 Essential (primary) hypertension: Secondary | ICD-10-CM | POA: Insufficient documentation

## 2019-12-26 DIAGNOSIS — I4891 Unspecified atrial fibrillation: Secondary | ICD-10-CM | POA: Insufficient documentation

## 2019-12-26 DIAGNOSIS — M791 Myalgia, unspecified site: Secondary | ICD-10-CM | POA: Diagnosis present

## 2019-12-26 DIAGNOSIS — E871 Hypo-osmolality and hyponatremia: Secondary | ICD-10-CM | POA: Insufficient documentation

## 2019-12-26 DIAGNOSIS — E876 Hypokalemia: Secondary | ICD-10-CM | POA: Diagnosis not present

## 2019-12-26 DIAGNOSIS — Z7901 Long term (current) use of anticoagulants: Secondary | ICD-10-CM | POA: Diagnosis not present

## 2019-12-26 DIAGNOSIS — Z87891 Personal history of nicotine dependence: Secondary | ICD-10-CM | POA: Diagnosis not present

## 2019-12-26 LAB — CBC WITH DIFFERENTIAL/PLATELET
Abs Immature Granulocytes: 0.02 10*3/uL (ref 0.00–0.07)
Basophils Absolute: 0 10*3/uL (ref 0.0–0.1)
Basophils Relative: 1 %
Eosinophils Absolute: 0 10*3/uL (ref 0.0–0.5)
Eosinophils Relative: 0 %
HCT: 42.3 % (ref 36.0–46.0)
Hemoglobin: 13.7 g/dL (ref 12.0–15.0)
Immature Granulocytes: 1 %
Lymphocytes Relative: 11 %
Lymphs Abs: 0.5 10*3/uL — ABNORMAL LOW (ref 0.7–4.0)
MCH: 25.9 pg — ABNORMAL LOW (ref 26.0–34.0)
MCHC: 32.4 g/dL (ref 30.0–36.0)
MCV: 80.1 fL (ref 80.0–100.0)
Monocytes Absolute: 0.2 10*3/uL (ref 0.1–1.0)
Monocytes Relative: 5 %
Neutro Abs: 3.6 10*3/uL (ref 1.7–7.7)
Neutrophils Relative %: 82 %
Platelets: 197 10*3/uL (ref 150–400)
RBC: 5.28 MIL/uL — ABNORMAL HIGH (ref 3.87–5.11)
RDW: 17.7 % — ABNORMAL HIGH (ref 11.5–15.5)
WBC: 4.4 10*3/uL (ref 4.0–10.5)
nRBC: 0 % (ref 0.0–0.2)

## 2019-12-26 LAB — URINALYSIS, ROUTINE W REFLEX MICROSCOPIC
Bilirubin Urine: NEGATIVE
Glucose, UA: NEGATIVE mg/dL
Hgb urine dipstick: NEGATIVE
Ketones, ur: 5 mg/dL — AB
Leukocytes,Ua: NEGATIVE
Nitrite: NEGATIVE
Protein, ur: 100 mg/dL — AB
Specific Gravity, Urine: 1.016 (ref 1.005–1.030)
pH: 6 (ref 5.0–8.0)

## 2019-12-26 LAB — COMPREHENSIVE METABOLIC PANEL
ALT: 26 U/L (ref 0–44)
AST: 38 U/L (ref 15–41)
Albumin: 3.6 g/dL (ref 3.5–5.0)
Alkaline Phosphatase: 54 U/L (ref 38–126)
Anion gap: 10 (ref 5–15)
BUN: 13 mg/dL (ref 8–23)
CO2: 28 mmol/L (ref 22–32)
Calcium: 8.3 mg/dL — ABNORMAL LOW (ref 8.9–10.3)
Chloride: 89 mmol/L — ABNORMAL LOW (ref 98–111)
Creatinine, Ser: 1.11 mg/dL — ABNORMAL HIGH (ref 0.44–1.00)
GFR, Estimated: 52 mL/min — ABNORMAL LOW (ref >60)
Glucose, Bld: 100 mg/dL — ABNORMAL HIGH (ref 70–99)
Potassium: 3 mmol/L — ABNORMAL LOW (ref 3.5–5.1)
Sodium: 127 mmol/L — ABNORMAL LOW (ref 135–145)
Total Bilirubin: 0.4 mg/dL (ref 0.3–1.2)
Total Protein: 6.9 g/dL (ref 6.5–8.1)

## 2019-12-26 LAB — RESPIRATORY PANEL BY RT PCR (FLU A&B, COVID)
Influenza A by PCR: NEGATIVE
Influenza B by PCR: NEGATIVE
SARS Coronavirus 2 by RT PCR: POSITIVE — AB

## 2019-12-26 MED ORDER — SODIUM CHLORIDE 0.9 % IV SOLN
1200.0000 mg | Freq: Once | INTRAVENOUS | Status: AC
  Administered 2019-12-26: 1200 mg via INTRAVENOUS
  Filled 2019-12-26: qty 10

## 2019-12-26 MED ORDER — SODIUM CHLORIDE 0.9 % IV BOLUS
1000.0000 mL | Freq: Once | INTRAVENOUS | Status: AC
  Administered 2019-12-26: 1000 mL via INTRAVENOUS

## 2019-12-26 MED ORDER — POTASSIUM CHLORIDE 10 MEQ/100ML IV SOLN
10.0000 meq | Freq: Once | INTRAVENOUS | Status: AC
  Administered 2019-12-26: 10 meq via INTRAVENOUS
  Filled 2019-12-26: qty 100

## 2019-12-26 MED ORDER — DIPHENHYDRAMINE HCL 50 MG/ML IJ SOLN: 50.0000 mg | Freq: Once | INTRAMUSCULAR | Status: DC | PRN

## 2019-12-26 MED ORDER — EPINEPHRINE 0.3 MG/0.3ML IJ SOAJ: 0.3000 mg | Freq: Once | INTRAMUSCULAR | Status: DC | PRN

## 2019-12-26 MED ORDER — ONDANSETRON 4 MG PO TBDP: 4.0000 mg | ORAL_TABLET | Freq: Three times a day (TID) | ORAL | 0 refills | Status: DC | PRN

## 2019-12-26 MED ORDER — ALBUTEROL SULFATE HFA 108 (90 BASE) MCG/ACT IN AERS: 2.0000 | INHALATION_SPRAY | Freq: Once | RESPIRATORY_TRACT | Status: DC | PRN

## 2019-12-26 MED ORDER — SODIUM CHLORIDE 0.9 % IV SOLN: INTRAVENOUS | Status: DC

## 2019-12-26 MED ORDER — METHYLPREDNISOLONE SODIUM SUCC 125 MG IJ SOLR: 125.0000 mg | Freq: Once | INTRAMUSCULAR | Status: DC | PRN

## 2019-12-26 MED ORDER — SODIUM CHLORIDE 0.9 % IV SOLN: INTRAVENOUS | Status: DC | PRN

## 2019-12-26 MED ORDER — FAMOTIDINE IN NACL 20-0.9 MG/50ML-% IV SOLN: 20.0000 mg | Freq: Once | INTRAVENOUS | Status: DC | PRN

## 2019-12-26 MED ORDER — POTASSIUM CHLORIDE CRYS ER 20 MEQ PO TBCR: 20.0000 meq | EXTENDED_RELEASE_TABLET | Freq: Every day | ORAL | 0 refills | Status: DC

## 2019-12-26 NOTE — ED Provider Notes (Signed)
Pt signed out by Dr. Sabra Heck pending MAB infusion.  Pt has experienced no side effects from the infusion.  She is feeling better and is ready to go.  Pt is stable for d/c.  Return if worse.   Isla Pence, MD 12/26/19 1744

## 2019-12-26 NOTE — ED Triage Notes (Signed)
covid symptoms for over a week.

## 2019-12-26 NOTE — ED Provider Notes (Signed)
[facility] EMERGENCY DEPARTMENT Provider Note   CSN: [id number] Arrival date & time: 12/26/19  1014     History Chief Complaint  Patient presents with  . Covid Exposure  . Dizziness    Paula Klein is a 74 y.o. female.  HPI   This patient is a 74 year old female, she has a history of prior high blood pressure, she states that she has had blood clots and currently takes Xarelto.  Review of the medical record shows that the patient is primarily treated for varicose veins of her legs but does endorse taking Xarelto for prior blood clots.  She had been admitted to the hospital in 2019 after she was diagnosed with hypoxic respiratory failure secondary to bronchitis.  She reports that she does not smoke anymore though she does have a smoking history.  The patient reports that over the last week she has had some progressive symptoms including some diffuse body aches, a feeling of some coughing and shortness of breath that would be mild, mild sore throat, generalized weakness with a couple of falls.  She denies any focal weakness and denies any changes in her vision or her speech.  She is not having any abdominal pain or chest pain at this time, she denies having any dysuria or diarrhea.  She has not sought a medical exam prior to this evaluation for this complaint.  She thinks that she was exposed to somebody with Covid a couple of weeks ago, she has not been tested, she has not been vaccinated.  Past Medical History:  Diagnosis Date  . H/O blood clots   . Hypertension     Patient Active Problem List   Diagnosis Date Noted  . Acute bronchitis 03/02/2018  . Varicose veins of both lower extremities with inflammation 08/31/2017  . Chronic venous insufficiency 08/31/2017  . Swelling of limb 08/31/2017    Past Surgical History:  Procedure Laterality Date  . COLONOSCOPY WITH PROPOFOL N/A 09/26/2015   Procedure: COLONOSCOPY WITH PROPOFOL;  Surgeon: Lollie Sails, MD;  Location: [facility]  ENDOSCOPY;  Service: Endoscopy;  Laterality: N/A;  . NO PAST SURGERIES       OB History   No obstetric history on file.     Family History  Problem Relation Age of Onset  . Varicose Veins Mother   . Heart attack Father   . Cancer Brother     Social History   Tobacco Use  . Smoking status: Former Smoker    Packs/day: 3.00    Years: 30.00    Pack years: 90.00    Quit date: 02/03/1989    Years since quitting: 30.9  . Smokeless tobacco: Never Used  Vaping Use  . Vaping Use: Never used  Substance Use Topics  . Alcohol use: No  . Drug use: No    Home Medications Prior to Admission medications   Medication Sig Start Date End Date Taking? Authorizing Provider  albuterol (PROVENTIL HFA;VENTOLIN HFA) 108 (90 Base) MCG/ACT inhaler Inhale 2 puffs into the lungs every 6 (six) hours as needed for shortness of breath.    [provider]  ALPRAZolam Duanne Moron) 0.5 MG tablet Take by mouth. 08/18/19   [provider]  Ascorbic Acid (VITAMIN C) 1000 MG tablet Take 1,000 mg by mouth daily.     [provider]  atenolol (TENORMIN) 25 MG tablet Take 25 mg by mouth daily.    [provider]  calcium carbonate (OS-CAL - DOSED IN MG OF ELEMENTAL CALCIUM) 1250 (  500 Ca) MG tablet Take 1 tablet by mouth daily.    [provider]  Cholecalciferol (VITAMIN D3 PO) Take 500 Units by mouth daily.    [provider]  clonazePAM (KLONOPIN) 0.5 MG tablet Take 0.5 mg by mouth daily as needed for anxiety.    [provider]  ELIQUIS 5 MG TABS tablet Take 5 mg by mouth 2 (two) times daily.  Patient not taking: Reported on 12/03/2019 08/07/17   [provider]  estradiol (ESTRACE) 0.5 MG tablet Take 0.5 mg by mouth daily.    [provider]  fluticasone furoate-vilanterol (BREO ELLIPTA) 100-25 MCG/INH AEPB Inhale 1 puff into the lungs daily. 08/17/18   Wilhelmina Mcardle, MD  glucosamine-chondroitin 500-400 MG tablet Take 1 tablet by mouth  2 (two) times daily.     [provider]  lisinopril-hydrochlorothiazide (PRINZIDE,ZESTORETIC) 20-12.5 MG tablet Take 1 tablet by mouth 2 (two) times daily.     [provider]  loratadine (CLARITIN) 10 MG tablet Take 10 mg by mouth daily.    [provider]  magnesium oxide (MAG-OX) 400 MG tablet Take 400 mg by mouth daily.     [provider]  medroxyPROGESTERone (PROVERA) 2.5 MG tablet Take 2.5 mg by mouth daily.    [provider]  Omega-3 Fatty Acids (FISH OIL PO) Take 1,200 mg by mouth daily.     [provider]  ondansetron (ZOFRAN ODT) 4 MG disintegrating tablet Take 1 tablet (4 mg total) by mouth every 8 (eight) hours as needed for nausea. 12/26/19   Noemi Chapel, MD  polyethylene glycol powder [facility]) 17 GM/SCOOP powder Take by mouth.    [provider]  potassium chloride SA (KLOR-CON) 20 MEQ tablet Take 1 tablet (20 mEq total) by mouth daily for 5 days. 12/26/19 12/31/19  Noemi Chapel, MD  quiNINE Orlin Hilding) 324 MG capsule  03/23/19   [provider]  tiotropium (SPIRIVA) 18 MCG inhalation capsule Place 18 mcg into inhaler and inhale daily.    [provider]  vitamin E 1000 UNIT capsule Take 1,000 Units by mouth daily.    [provider]  XARELTO 10 MG TABS tablet SMARTSIG:1 Tablet(s) By Mouth Every Evening 09/20/19   [provider]    Allergies    Pine tar, Sulfa antibiotics, and Tetracyclines & related  Review of Systems   Review of Systems  All other systems reviewed and are negative.   Physical Exam Updated Vital Signs BP 119/78   Pulse 87   Temp 98.5 F (36.9 C)   Resp 19   SpO2 92%   Physical Exam Vitals and nursing note reviewed.  Constitutional:      General: She is not in acute distress.    Appearance: She is well-developed.  HENT:     Head: Normocephalic and atraumatic.     Mouth/Throat:     Mouth: Mucous membranes are dry.     Pharynx: No  oropharyngeal exudate.     Comments: Mucous membranes are slightly dehydrated Eyes:     General: No scleral icterus.       Right eye: No discharge.        Left eye: No discharge.     Conjunctiva/sclera: Conjunctivae normal.     Pupils: Pupils are equal, round, and reactive to light.  Neck:     Thyroid: No thyromegaly.     Vascular: No JVD.  Cardiovascular:     Rate and Rhythm: Regular rhythm. Tachycardia present.  Heart sounds: Normal heart sounds. No murmur heard.  No friction rub. No gallop.      Comments: Heart rate of approximately 100-105, some irregularity, normal pulses, no edema Pulmonary:     Effort: Pulmonary effort is normal. No respiratory distress.     Breath sounds: Normal breath sounds. No wheezing or rales.     Comments: Lungs are totally clear to auscultation Abdominal:     General: Bowel sounds are normal. There is no distension.     Palpations: Abdomen is soft. There is no mass.     Tenderness: There is no abdominal tenderness.  Musculoskeletal:        General: No tenderness. Normal range of motion.     Cervical back: Normal range of motion and neck supple.  Lymphadenopathy:     Cervical: No cervical adenopathy.  Skin:    General: Skin is warm and dry.     Findings: No erythema or rash.  Neurological:     Mental Status: She is alert.     Coordination: Coordination normal.     Comments: Mental status is totally normal, she is awake alert and able to follow commands.  She has a totally normal strength in all 4 extremities and no facial droop  Psychiatric:        Behavior: Behavior normal.     ED Results / Procedures / Treatments   Labs (all labs ordered are listed, but only abnormal results are displayed) Labs Reviewed  RESPIRATORY PANEL BY RT PCR (FLU A&B, COVID) - Abnormal; Notable for the following components:      Result Value   SARS Coronavirus 2 by RT PCR POSITIVE (*)    All other components within normal limits  CBC WITH  DIFFERENTIAL/PLATELET - Abnormal; Notable for the following components:   RBC 5.28 (*)    MCH 25.9 (*)    RDW 17.7 (*)    Lymphs Abs 0.5 (*)    All other components within normal limits  COMPREHENSIVE METABOLIC PANEL - Abnormal; Notable for the following components:   Sodium 127 (*)    Potassium 3.0 (*)    Chloride 89 (*)    Glucose, Bld 100 (*)    Creatinine, Ser 1.11 (*)    Calcium 8.3 (*)    GFR, Estimated 52 (*)    All other components within normal limits  URINALYSIS, ROUTINE W REFLEX MICROSCOPIC - Abnormal; Notable for the following components:   Color, Urine AMBER (*)    APPearance CLOUDY (*)    Ketones, ur 5 (*)    Protein, ur 100 (*)    Bacteria, UA FEW (*)    All other components within normal limits    EKG EKG Interpretation  Date/Time:  Sunday December 26 2019 11:32:34 EDT Ventricular Rate:  91 PR Interval:    QRS Duration: 86 QT Interval:  384 QTC Calculation: 473 R Axis:   26 Text Interpretation: Atrial fibrillation Nonspecific T abnormalities, diffuse leads Since last tracing now with what appears to be afib Confirmed by Noemi Chapel [phone]) on 12/26/2019 12:12:13 PM   Radiology DG Chest Port 1 View  Result Date: 12/26/2019 CLINICAL DATA:  Cough for 1 week EXAM: PORTABLE CHEST 1 VIEW COMPARISON:  08/13/2018 FINDINGS: The heart size and mediastinal contours are within normal limits. Coarsened interstitial markings bilaterally. Streaky bibasilar opacities. No pleural effusion. No pneumothorax. The visualized skeletal structures are unremarkable. IMPRESSION: 1. Streaky bibasilar opacities may represent atelectasis or infection. 2. Coarsened interstitial markings bilaterally which could reflect  bronchitic type lung changes, mild edema, or developing atypical/viral infection. Electronically Signed   By: Davina Poke D.O.   On: 12/26/2019 12:15    Procedures Procedures (including critical care time)  Medications Ordered in ED Medications  0.9 %  sodium  chloride infusion ( Intravenous New Bag/Given (Non-Interop) 12/26/19 1157)  casirivimab-imdevimab (REGEN-COV) 1,200 mg in sodium chloride 0.9 % 110 mL IVPB (has no administration in time range)  0.9 %  sodium chloride infusion (has no administration in time range)  diphenhydrAMINE (BENADRYL) injection 50 mg (has no administration in time range)  famotidine (PEPCID) IVPB 20 mg premix (has no administration in time range)  methylPREDNISolone sodium succinate (SOLU-MEDROL) 125 mg/2 mL injection 125 mg (has no administration in time range)  albuterol (VENTOLIN HFA) 108 (90 Base) MCG/ACT inhaler 2 puff (has no administration in time range)  EPINEPHrine (EPI-PEN) injection 0.3 mg (has no administration in time range)  sodium chloride 0.9 % bolus 1,000 mL (1,000 mLs Intravenous New Bag/Given (Non-Interop) 12/26/19 1319)  potassium chloride 10 mEq in 100 mL IVPB (0 mEq Intravenous Stopped 12/26/19 1430)    ED Course  I have reviewed the triage vital signs and the nursing notes.  Pertinent labs & imaging results that were available during my care of the patient were reviewed by me and considered in my medical decision making (see chart for details).  Clinical Course as of Dec 26 1515  Sun Dec 26, 2019  1414 Normal saline bolus as well as potassium has been given, Covid test pending, x-ray performed   [BM]  1414 No signs of overt infiltrate, some atelectasis present   [BM]    Clinical Course User Index [BM] Noemi Chapel, MD   MDM Rules/Calculators/A&P                          At this time the patient has what appears to be a general malaise, she is not focally weak, she has had a possible exposure and with cough and shortness of breath will be tested for Covid.  Her blood pressure is currently 564 systolic, she is afebrile and is not hypoxic.  She is over 94% on room air, has no edema and is not having active chest pain.  The patient is agreeable to the plan  The patient does have new onset  atrial fibrillation, she is completely rate controlled and already anticoagulated. She is Covid positive and getting the monoclonal antibody infusion which I have talked to her about and she has consented to have. She has been treated for hyponatremia, hypokalemia and some dehydration and has done very well, she feels good, her oxygen is above 92% and she is not coughing at all. She has no fever, she is well-appearing and agreeable to discharge. She will be referred to the outpatient setting cardiology  At change of shift - care signed out to Dr. Gilford Raid to follow up after infusion complete - anticipate d/c home.  Final Clinical Impression(s) / ED Diagnoses Final diagnoses:  COVID-19  Atrial fibrillation, unspecified type ([place])  Hypokalemia  Hyponatremia    Rx / DC Orders ED Discharge Orders         Ordered    ondansetron (ZOFRAN ODT) 4 MG disintegrating tablet  Every 8 hours PRN        12/26/19 1513    potassium chloride SA (KLOR-CON) 20 MEQ tablet  Daily        12/26/19 1516  Noemi Chapel, MD 12/26/19 [id number]

## 2019-12-26 NOTE — Progress Notes (Signed)
Pharmacy COVID-19 Monoclonal Antibody Screening  Paula Klein was identified as being not hospitalized with symptoms from Covid-19 on admission but an incidental positive PCR has been documented.  The patient may qualify for the use of monoclonal antibodies (mAB) for COVID-19 viral infection to prevent worsening symptoms stemming from Covid-19 infection.  The patient was identified based on a positive COVID-19 PCR and not requiring the use of supplemental oxygen at this time.  This patient meets the FDA criteria for Emergency Use Authorization of casirivimab/imdevimab or bamlanivimab/etesevimab.  Has a (+) direct SARS-CoV-2 viral test result  Is NOT hospitalized due to COVID-19  Is within 10 days of symptom onset  Has at least one of the high risk factor(s) for progression to severe COVID-19 and/or hospitalization as defined in EUA.  Specific high risk criteria : Older age (>/= 74 yo)  Additionally: The patient has had a positive COVID-19 PCR in the last 90 days.  The patient is unvaccinated against COVID-19.  Since the patient is unvaccinated and meets high risk criteria, the patient is eligible for mAB administration.    Plan: Based on the above discussion, it was decided that the patient will receive one dose of the available COVID-19 mAB combination. Pharmacy will coordinate administration timing with patient's nurse. Recommended infusion monitoring parameters communicated to the nursing team.   Ramond Craver 12/26/2019  3:18 PM

## 2019-12-26 NOTE — Discharge Instructions (Addendum)
Your testing today shows that you have the following Slightly low potassium and slightly low salt levels Take Potassium daily for 5 days It also shows that you have COVID-19 but no significant signs of pneumonia We have given you the Covid infusion which is the antibodies that are used to treat this infection This may or may not give you much improvement but over the next several days you should gradually improve. You may take Zofran as needed for nausea, drink plenty of clear liquids and make sure that you're using your albuterol inhaler 2 puffs every 4 hours as needed.   Seek medical exam for severe worsening symptoms.   Please also be aware that you have atrial fibrillation which is an irregular heart rate and it may be a chronic problem. You are already taking the right medications with a blood thinner, however you may find that your heart starts to race and if this occurs please return to the emergency department. You'll need to see the heart doctor, I have given you their phone number above, please call in the morning to make the next medical appointment

## 2019-12-31 ENCOUNTER — Inpatient Hospital Stay (HOSPITAL_COMMUNITY)
Admission: EM | Admit: 2019-12-31 | Discharge: 2020-01-02 | DRG: 177 | Disposition: A | Discharge status: Home / Self Care | Payer: Medicare HMO | Attending: Internal Medicine | Admitting: Internal Medicine

## 2019-12-31 ENCOUNTER — Emergency Department (HOSPITAL_COMMUNITY): Payer: Medicare HMO

## 2019-12-31 ENCOUNTER — Encounter (HOSPITAL_COMMUNITY): Payer: Self-pay

## 2019-12-31 ENCOUNTER — Other Ambulatory Visit: Payer: Self-pay

## 2019-12-31 DIAGNOSIS — J9601 Acute respiratory failure with hypoxia: Secondary | ICD-10-CM | POA: Diagnosis present

## 2019-12-31 DIAGNOSIS — Z86718 Personal history of other venous thrombosis and embolism: Secondary | ICD-10-CM

## 2019-12-31 DIAGNOSIS — Z91048 Other nonmedicinal substance allergy status: Secondary | ICD-10-CM

## 2019-12-31 DIAGNOSIS — Z7951 Long term (current) use of inhaled steroids: Secondary | ICD-10-CM

## 2019-12-31 DIAGNOSIS — J1282 Pneumonia due to coronavirus disease 2019: Secondary | ICD-10-CM | POA: Diagnosis present

## 2019-12-31 DIAGNOSIS — E871 Hypo-osmolality and hyponatremia: Secondary | ICD-10-CM | POA: Diagnosis present

## 2019-12-31 DIAGNOSIS — U071 COVID-19: Secondary | ICD-10-CM | POA: Diagnosis present

## 2019-12-31 DIAGNOSIS — Z881 Allergy status to other antibiotic agents status: Secondary | ICD-10-CM

## 2019-12-31 DIAGNOSIS — I10 Essential (primary) hypertension: Secondary | ICD-10-CM | POA: Diagnosis present

## 2019-12-31 DIAGNOSIS — Z79899 Other long term (current) drug therapy: Secondary | ICD-10-CM

## 2019-12-31 DIAGNOSIS — Z8249 Family history of ischemic heart disease and other diseases of the circulatory system: Secondary | ICD-10-CM

## 2019-12-31 DIAGNOSIS — I4891 Unspecified atrial fibrillation: Secondary | ICD-10-CM | POA: Diagnosis present

## 2019-12-31 DIAGNOSIS — Z87891 Personal history of nicotine dependence: Secondary | ICD-10-CM

## 2019-12-31 DIAGNOSIS — Z7902 Long term (current) use of antithrombotics/antiplatelets: Secondary | ICD-10-CM | POA: Diagnosis not present

## 2019-12-31 DIAGNOSIS — E876 Hypokalemia: Secondary | ICD-10-CM | POA: Diagnosis present

## 2019-12-31 DIAGNOSIS — I872 Venous insufficiency (chronic) (peripheral): Secondary | ICD-10-CM | POA: Diagnosis present

## 2019-12-31 DIAGNOSIS — Z882 Allergy status to sulfonamides status: Secondary | ICD-10-CM

## 2019-12-31 DIAGNOSIS — J440 Chronic obstructive pulmonary disease with acute lower respiratory infection: Secondary | ICD-10-CM | POA: Diagnosis present

## 2019-12-31 LAB — LIPASE, BLOOD: Lipase: 28 U/L (ref 11–51)

## 2019-12-31 LAB — GLUCOSE, CAPILLARY: Glucose-Capillary: 142 mg/dL — ABNORMAL HIGH (ref 70–99)

## 2019-12-31 LAB — COMPREHENSIVE METABOLIC PANEL
ALT: 37 U/L (ref 0–44)
AST: 34 U/L (ref 15–41)
Albumin: 3 g/dL — ABNORMAL LOW (ref 3.5–5.0)
Alkaline Phosphatase: 72 U/L (ref 38–126)
Anion gap: 12 (ref 5–15)
BUN: 12 mg/dL (ref 8–23)
CO2: 25 mmol/L (ref 22–32)
Calcium: 8.6 mg/dL — ABNORMAL LOW (ref 8.9–10.3)
Chloride: 94 mmol/L — ABNORMAL LOW (ref 98–111)
Creatinine, Ser: 0.82 mg/dL (ref 0.44–1.00)
GFR, Estimated: >60 mL/min (ref >60)
Glucose, Bld: 93 mg/dL (ref 70–99)
Potassium: 2.8 mmol/L — ABNORMAL LOW (ref 3.5–5.1)
Sodium: 131 mmol/L — ABNORMAL LOW (ref 135–145)
Total Bilirubin: 0.8 mg/dL (ref 0.3–1.2)
Total Protein: 6.7 g/dL (ref 6.5–8.1)

## 2019-12-31 LAB — URINALYSIS, ROUTINE W REFLEX MICROSCOPIC
Bacteria, UA: NONE SEEN
Bilirubin Urine: NEGATIVE
Glucose, UA: NEGATIVE mg/dL
Ketones, ur: 20 mg/dL — AB
Leukocytes,Ua: NEGATIVE
Nitrite: NEGATIVE
Protein, ur: 30 mg/dL — AB
Specific Gravity, Urine: 1.014 (ref 1.005–1.030)
pH: 6 (ref 5.0–8.0)

## 2019-12-31 LAB — MAGNESIUM: Magnesium: 2 mg/dL (ref 1.7–2.4)

## 2019-12-31 LAB — CBC
HCT: 38.5 % (ref 36.0–46.0)
Hemoglobin: 12.5 g/dL (ref 12.0–15.0)
MCH: 26.4 pg (ref 26.0–34.0)
MCHC: 32.5 g/dL (ref 30.0–36.0)
MCV: 81.4 fL (ref 80.0–100.0)
Platelets: 405 10*3/uL — ABNORMAL HIGH (ref 150–400)
RBC: 4.73 MIL/uL (ref 3.87–5.11)
RDW: 17.1 % — ABNORMAL HIGH (ref 11.5–15.5)
WBC: 9.3 10*3/uL (ref 4.0–10.5)
nRBC: 0 % (ref 0.0–0.2)

## 2019-12-31 LAB — C-REACTIVE PROTEIN: CRP: 20.2 mg/dL — ABNORMAL HIGH (ref <1.0)

## 2019-12-31 LAB — LACTIC ACID, PLASMA
Lactic Acid, Venous: 1.7 mmol/L (ref 0.5–1.9)
Lactic Acid, Venous: 1.2 mmol/L (ref 0.5–1.9)

## 2019-12-31 LAB — D-DIMER, QUANTITATIVE: D-Dimer, Quant: 1.74 ug/mL-FEU — ABNORMAL HIGH (ref 0.00–0.50)

## 2019-12-31 LAB — PROCALCITONIN: Procalcitonin: <0.1 ng/mL

## 2019-12-31 MED ORDER — ALBUTEROL SULFATE HFA 108 (90 BASE) MCG/ACT IN AERS: 2.0000 | INHALATION_SPRAY | Freq: Four times a day (QID) | RESPIRATORY_TRACT | Status: DC | PRN

## 2019-12-31 MED ORDER — PREDNISONE 20 MG PO TABS: 50.0000 mg | ORAL_TABLET | Freq: Every day | ORAL | Status: DC

## 2019-12-31 MED ORDER — POTASSIUM CHLORIDE IN NACL 20-0.9 MEQ/L-% IV SOLN
Freq: Once | INTRAVENOUS | Status: AC
  Filled 2019-12-31: qty 1000

## 2019-12-31 MED ORDER — POTASSIUM CHLORIDE 10 MEQ/100ML IV SOLN
10.0000 meq | INTRAVENOUS | Status: AC
  Administered 2019-12-31 (×4): 10 meq via INTRAVENOUS
  Filled 2019-12-31 (×4): qty 100

## 2019-12-31 MED ORDER — MAGNESIUM SULFATE IN D5W 1-5 GM/100ML-% IV SOLN
1.0000 g | Freq: Once | INTRAVENOUS | Status: AC
  Administered 2019-12-31: 1 g via INTRAVENOUS
  Filled 2019-12-31: qty 100

## 2019-12-31 MED ORDER — PANTOPRAZOLE SODIUM 40 MG PO TBEC
40.0000 mg | DELAYED_RELEASE_TABLET | Freq: Every day | ORAL | Status: DC
  Administered 2019-12-31 – 2020-01-02 (×3): 40 mg via ORAL
  Filled 2019-12-31 (×3): qty 1

## 2019-12-31 MED ORDER — TIOTROPIUM BROMIDE MONOHYDRATE 18 MCG IN CAPS: 18.0000 ug | ORAL_CAPSULE | Freq: Every day | RESPIRATORY_TRACT | Status: DC

## 2019-12-31 MED ORDER — GUAIFENESIN-DM 100-10 MG/5ML PO SYRP: 10.0000 mL | ORAL_SOLUTION | ORAL | Status: DC | PRN

## 2019-12-31 MED ORDER — SODIUM CHLORIDE 0.9 % IV SOLN
100.0000 mg | Freq: Every day | INTRAVENOUS | Status: DC
  Administered 2020-01-01 – 2020-01-02 (×2): 100 mg via INTRAVENOUS
  Filled 2019-12-31 (×2): qty 20

## 2019-12-31 MED ORDER — OMEGA-3-ACID ETHYL ESTERS 1 G PO CAPS
1.0000 g | ORAL_CAPSULE | Freq: Two times a day (BID) | ORAL | Status: DC
  Administered 2019-12-31 – 2020-01-02 (×4): 1 g via ORAL
  Filled 2019-12-31 (×11): qty 1

## 2019-12-31 MED ORDER — INSULIN ASPART 100 UNIT/ML ~~LOC~~ SOLN
0.0000 [IU] | Freq: Three times a day (TID) | SUBCUTANEOUS | Status: DC
  Administered 2020-01-01: 1 [IU] via SUBCUTANEOUS
  Administered 2020-01-01 – 2020-01-02 (×3): 2 [IU] via SUBCUTANEOUS

## 2019-12-31 MED ORDER — FLUTICASONE FUROATE-VILANTEROL 100-25 MCG/INH IN AEPB: 1.0000 | INHALATION_SPRAY | Freq: Every day | RESPIRATORY_TRACT | Status: DC

## 2019-12-31 MED ORDER — UMECLIDINIUM BROMIDE 62.5 MCG/INH IN AEPB: 1.0000 | INHALATION_SPRAY | Freq: Every day | RESPIRATORY_TRACT | Status: DC

## 2019-12-31 MED ORDER — LACTATED RINGERS IV BOLUS: 1000.0000 mL | Freq: Once | INTRAVENOUS | Status: DC

## 2019-12-31 MED ORDER — RIVAROXABAN 20 MG PO TABS
20.0000 mg | ORAL_TABLET | Freq: Every day | ORAL | Status: DC
  Administered 2019-12-31 – 2020-01-01 (×2): 20 mg via ORAL
  Filled 2019-12-31 (×2): qty 1

## 2019-12-31 MED ORDER — FLUTICASONE FUROATE-VILANTEROL 100-25 MCG/INH IN AEPB
1.0000 | INHALATION_SPRAY | Freq: Every day | RESPIRATORY_TRACT | Status: DC
  Administered 2020-01-01 – 2020-01-02 (×2): 1 via RESPIRATORY_TRACT
  Filled 2019-12-31: qty 28

## 2019-12-31 MED ORDER — METHYLPREDNISOLONE SODIUM SUCC 125 MG IJ SOLR
1.0000 mg/kg | Freq: Two times a day (BID) | INTRAMUSCULAR | Status: DC
  Administered 2019-12-31 – 2020-01-02 (×4): 68.75 mg via INTRAVENOUS
  Filled 2019-12-31 (×4): qty 2

## 2019-12-31 MED ORDER — ATENOLOL 25 MG PO TABS
25.0000 mg | ORAL_TABLET | Freq: Every day | ORAL | Status: DC
  Administered 2020-01-01 – 2020-01-02 (×2): 25 mg via ORAL
  Filled 2019-12-31 (×2): qty 1

## 2019-12-31 MED ORDER — INSULIN ASPART 100 UNIT/ML ~~LOC~~ SOLN: 0.0000 [IU] | Freq: Every day | SUBCUTANEOUS | Status: DC

## 2019-12-31 MED ORDER — ACETAMINOPHEN 325 MG PO TABS: 650.0000 mg | ORAL_TABLET | Freq: Four times a day (QID) | ORAL | Status: DC | PRN

## 2019-12-31 MED ORDER — ALPRAZOLAM 0.25 MG PO TABS
0.2500 mg | ORAL_TABLET | Freq: Two times a day (BID) | ORAL | Status: DC | PRN
  Administered 2019-12-31 – 2020-01-01 (×2): 0.25 mg via ORAL
  Filled 2019-12-31 (×2): qty 1

## 2019-12-31 MED ORDER — DEXAMETHASONE SODIUM PHOSPHATE 10 MG/ML IJ SOLN
10.0000 mg | Freq: Once | INTRAMUSCULAR | Status: DC
  Filled 2019-12-31: qty 1

## 2019-12-31 MED ORDER — IPRATROPIUM-ALBUTEROL 20-100 MCG/ACT IN AERS
1.0000 | INHALATION_SPRAY | Freq: Four times a day (QID) | RESPIRATORY_TRACT | Status: DC
  Administered 2019-12-31 – 2020-01-02 (×7): 1 via RESPIRATORY_TRACT
  Filled 2019-12-31: qty 4

## 2019-12-31 MED ORDER — HYDROCOD POLST-CPM POLST ER 10-8 MG/5ML PO SUER
5.0000 mL | Freq: Two times a day (BID) | ORAL | Status: DC | PRN
  Administered 2019-12-31: 5 mL via ORAL
  Filled 2019-12-31: qty 5

## 2019-12-31 MED ORDER — UMECLIDINIUM BROMIDE 62.5 MCG/INH IN AEPB
1.0000 | INHALATION_SPRAY | Freq: Every day | RESPIRATORY_TRACT | Status: DC
  Administered 2020-01-01 – 2020-01-02 (×2): 1 via RESPIRATORY_TRACT
  Filled 2019-12-31: qty 7

## 2019-12-31 MED ORDER — SODIUM CHLORIDE 0.9 % IV SOLN
100.0000 mg | INTRAVENOUS | Status: AC
  Administered 2019-12-31 (×2): 100 mg via INTRAVENOUS
  Filled 2019-12-31 (×2): qty 20

## 2019-12-31 MED ORDER — METOPROLOL TARTRATE 5 MG/5ML IV SOLN
5.0000 mg | INTRAVENOUS | Status: AC | PRN
  Administered 2019-12-31 – 2020-01-01 (×2): 5 mg via INTRAVENOUS
  Filled 2019-12-31 (×2): qty 5

## 2019-12-31 MED ORDER — METOCLOPRAMIDE HCL 5 MG/ML IJ SOLN
10.0000 mg | Freq: Once | INTRAMUSCULAR | Status: AC
  Administered 2019-12-31: 10 mg via INTRAVENOUS
  Filled 2019-12-31: qty 2

## 2019-12-31 NOTE — ED Triage Notes (Signed)
Pt presents to ED with complaints of nausea and vomiting x 4 days. Pt covid positive

## 2019-12-31 NOTE — Plan of Care (Signed)
  Problem: Education: Goal: Knowledge of General Education information will improve Description: Including pain rating scale, medication(s)/side effects and non-pharmacologic comfort measures Outcome: Progressing   Problem: Safety: Goal: Ability to remain free from injury will improve Outcome: Progressing   Problem: Skin Integrity: Goal: Risk for impaired skin integrity will decrease Outcome: Progressing   

## 2019-12-31 NOTE — H&P (Signed)
TRH H&P   Patient Demographics:    Paula Klein, is a 74 y.o. female  MRN: [id number]   DOB - June 22, 1945  Admit Date - 12/31/2019  Outpatient Primary MD for the patient is Marguerita Merles, MD  Referring MD/NP/PA: Dr Vanita Panda  Patient coming from: Home  Chief Complaint  Patient presents with  . Emesis      HPI:    Paula Klein  is a 74 y.o. female, with past medical history of COPD, hypertension, history of DVT on Xarelto, patient with Covid symptoms for last 2 weeks, she tested +10/24, were she received monoclonal antibody, reports worsening symptoms over last 3 days, nausea, vomiting, generalized weakness and poor oral intake, she denies chills, but no clear fevers, she reports generalized body ache, he does report dyspnea, she is unvaccinated against Covid, she denies any hemoptysis, diarrhea. - in ED patient was saturating 89% on room air for which she was started on nasal cannula, she had multiple electrolyte abnormalities including hyponatremia at 131, hypokalemia at 2.8, this x-ray showing worsening multifocal opacity, started on steroids, Remdesivir, and [facility] consulted to admit.    Review of systems:     No Fever-chills, does report generalized weakness, fatigue, poor appetite and oral intake No Headache, No changes with Vision or hearing, No problems swallowing food or Liquids, No Chest pain, Cough, she reports dyspnea No Abdominal pain, she reports nausea and vomiting, Bowel movements are regular, No Blood in stool or Urine, No dysuria, No new skin rashes or bruises, No new joints pains-aches,  No new weakness, tingling, numbness in any extremity, No recent weight gain or loss, No polyuria, polydypsia or polyphagia, No significant Mental Stressors.  A full 10 point Review of Systems was done, except as stated above, all other Review of Systems were  negative.   With Past History of the following :    Past Medical History:  Diagnosis Date  . H/O blood clots   . Hypertension       Past Surgical History:  Procedure Laterality Date  . COLONOSCOPY WITH PROPOFOL N/A 09/26/2015   Procedure: COLONOSCOPY WITH PROPOFOL;  Surgeon: Lollie Sails, MD;  Location: [facility] ENDOSCOPY;  Service: Endoscopy;  Laterality: N/A;  . NO PAST SURGERIES        Social History:     Social History   Tobacco Use  . Smoking status: Former Smoker    Packs/day: 3.00    Years: 30.00    Pack years: 90.00    Quit date: 02/03/1989    Years since quitting: 30.9  . Smokeless tobacco: Never Used  Substance Use Topics  . Alcohol use: No      Family History :     Family History  Problem Relation Age of Onset  . Varicose Veins Mother   . Heart attack Father   . Cancer Brother  Home Medications:   Prior to Admission medications   Medication Sig Start Date End Date Taking? Authorizing Provider  albuterol (PROVENTIL HFA;VENTOLIN HFA) 108 (90 Base) MCG/ACT inhaler Inhale 2 puffs into the lungs every 6 (six) hours as needed for shortness of breath.    [provider]  ALPRAZolam Duanne Moron) 0.5 MG tablet Take by mouth. 08/18/19   [provider]  Ascorbic Acid (VITAMIN C) 1000 MG tablet Take 1,000 mg by mouth daily.     [provider]  atenolol (TENORMIN) 25 MG tablet Take 25 mg by mouth daily.    [provider]  calcium carbonate (OS-CAL - DOSED IN MG OF ELEMENTAL CALCIUM) 1250 (500 Ca) MG tablet Take 1 tablet by mouth daily.    [provider]  Cholecalciferol (VITAMIN D3 PO) Take 500 Units by mouth daily.    [provider]  clonazePAM (KLONOPIN) 0.5 MG tablet Take 0.5 mg by mouth daily as needed for anxiety.    [provider]  ELIQUIS 5 MG TABS tablet Take 5 mg by mouth 2 (two) times daily.  Patient not taking: Reported on 12/03/2019 08/07/17   [provider]  estradiol  (ESTRACE) 0.5 MG tablet Take 0.5 mg by mouth daily.    [provider]  fluticasone furoate-vilanterol (BREO ELLIPTA) 100-25 MCG/INH AEPB Inhale 1 puff into the lungs daily. 08/17/18   Wilhelmina Mcardle, MD  glucosamine-chondroitin 500-400 MG tablet Take 1 tablet by mouth 2 (two) times daily.     [provider]  lisinopril-hydrochlorothiazide (PRINZIDE,ZESTORETIC) 20-12.5 MG tablet Take 1 tablet by mouth 2 (two) times daily.     [provider]  loratadine (CLARITIN) 10 MG tablet Take 10 mg by mouth daily.    [provider]  magnesium oxide (MAG-OX) 400 MG tablet Take 400 mg by mouth daily.     [provider]  medroxyPROGESTERone (PROVERA) 2.5 MG tablet Take 2.5 mg by mouth daily.    [provider]  Omega-3 Fatty Acids (FISH OIL PO) Take 1,200 mg by mouth daily.     [provider]  ondansetron (ZOFRAN ODT) 4 MG disintegrating tablet Take 1 tablet (4 mg total) by mouth every 8 (eight) hours as needed for nausea. 12/26/19   Noemi Chapel, MD  polyethylene glycol powder [facility]) 17 GM/SCOOP powder Take by mouth.    [provider]  potassium chloride SA (KLOR-CON) 20 MEQ tablet Take 1 tablet (20 mEq total) by mouth daily for 5 days. 12/26/19 12/31/19  Noemi Chapel, MD  quiNINE Orlin Hilding) 324 MG capsule  03/23/19   [provider]  tiotropium (SPIRIVA) 18 MCG inhalation capsule Place 18 mcg into inhaler and inhale daily.    [provider]  vitamin E 1000 UNIT capsule Take 1,000 Units by mouth daily.    [provider]  XARELTO 10 MG TABS tablet Take 10 mg by mouth daily.  09/20/19   [provider]     Allergies:     Allergies  Allergen Reactions  . Pine Tar Cough  . Sulfa Antibiotics Rash  . Tetracyclines & Related Rash     Physical Exam:   Vitals  Blood pressure (!) 145/84, pulse 99, temperature 98.1 F (36.7 C), temperature source Oral, resp. rate 19, height 5\' 2"   (1.575 m), weight 68.5 kg, SpO2 99 %.   1. General frail, elderly female, laying in bed in mild discomfort  2. Normal affect and insight, Not Suicidal or Homicidal, Awake Alert, Oriented X 3.  3. No F.N deficits, ALL C.Nerves Intact, Strength 5/5 all 4 extremities, Sensation intact all 4 extremities, Plantars down going.  4. Ears and Eyes appear Normal, Conjunctivae clear, PERRLA.  Dry oral Mucosa.  5. Supple Neck, No JVD, No cervical lymphadenopathy appriciated, No Carotid Bruits.  6. Symmetrical Chest wall movement, Good air movement bilaterally, CTAB.  7.  Irregular irregular, No Gallops, Rubs or Murmurs, No Parasternal Heave.  8. Positive Bowel Sounds, Abdomen Soft, No tenderness, No organomegaly appriciated,No rebound -guarding or rigidity.  9.  No Cyanosis, Normal Skin Turgor, No Skin Rash or Bruise.  10. Good muscle tone,  joints appear normal , no effusions, Normal ROM.  11. No Palpable Lymph Nodes in Neck or Axillae     Data Review:    CBC Recent Labs  Lab 12/26/19 1131 12/31/19 1434  WBC 4.4 9.3  HGB 13.7 12.5  HCT 42.3 38.5  PLT 197 405*  MCV 80.1 81.4  MCH 25.9* 26.4  MCHC 32.4 32.5  RDW 17.7* 17.1*  LYMPHSABS 0.5*  --   MONOABS 0.2  --   EOSABS 0.0  --   BASOSABS 0.0  --    ------------------------------------------------------------------------------------------------------------------  Chemistries  Recent Labs  Lab 12/26/19 1131 12/31/19 1434  NA 127* 131*  K 3.0* 2.8*  CL 89* 94*  CO2 28 25  GLUCOSE 100* 93  BUN 13 12  CREATININE 1.11* 0.82  CALCIUM 8.3* 8.6*  AST 38 34  ALT 26 37  ALKPHOS 54 72  BILITOT 0.4 0.8   ------------------------------------------------------------------------------------------------------------------ estimated creatinine clearance is 54.6 mL/min (by C-G formula based on SCr of 0.82 mg/dL). ------------------------------------------------------------------------------------------------------------------ No  results for input(s): TSH, T4TOTAL, T3FREE, THYROIDAB in the last 72 hours.  Invalid input(s): FREET3  Coagulation profile No results for input(s): INR, PROTIME in the last 168 hours. ------------------------------------------------------------------------------------------------------------------- No results for input(s): DDIMER in the last 72 hours. -------------------------------------------------------------------------------------------------------------------  Cardiac Enzymes No results for input(s): CKMB, TROPONINI, MYOGLOBIN in the last 168 hours.  Invalid input(s): CK ------------------------------------------------------------------------------------------------------------------    Component Value Date/Time   BNP 141.0 (H) 03/02/2018 0510     ---------------------------------------------------------------------------------------------------------------  Urinalysis    Component Value Date/Time   COLORURINE YELLOW 12/31/2019 1339   APPEARANCEUR HAZY (A) 12/31/2019 1339   LABSPEC 1.014 12/31/2019 1339   PHURINE 6.0 12/31/2019 1339   GLUCOSEU NEGATIVE 12/31/2019 1339   HGBUR SMALL (A) 12/31/2019 1339   BILIRUBINUR NEGATIVE 12/31/2019 1339   KETONESUR 20 (A) 12/31/2019 1339   PROTEINUR 30 (A) 12/31/2019 1339   NITRITE NEGATIVE 12/31/2019 1339   LEUKOCYTESUR NEGATIVE 12/31/2019 1339    ----------------------------------------------------------------------------------------------------------------   Imaging Results:    DG Chest Port 1 View  Result Date: 12/31/2019 CLINICAL DATA:  Shortness of breath, COVID-19 positive EXAM: PORTABLE CHEST 1 VIEW COMPARISON:  12/26/2019 FINDINGS: Stable cardiomediastinal contours. Interval progression of heterogeneous opacities throughout both lungs, with a predominantly peripheral distribution. No pleural effusion or pneumothorax. IMPRESSION: Interval progression of bilateral heterogeneous airspace opacities, most prominent in the  peripheral distribution, concerning for worsening multifocal atypical/viral pneumonia. Electronically Signed   By: Davina Poke D.O.   On: 12/31/2019 15:36    My personal review of EKG: Rhythm NSR, Rate  104 /min, QTc 536, no Acute ST changes   Assessment & Plan:    Active Problems:   Essential hypertension   History of DVT of lower extremity   Pneumonia due to COVID-19 virus  Acute hypoxic respiratory failure due to COVID-19 of pneumonia -Unfortunately patient is unvaccinated. 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 -Was encouraged with incentive spirometry and flutter valve, out of bed to chair and to prone. -Given she is hypoxic will start on IV steroids Solu-Medrol . -She will be started on remdesivir. -Given mild hypoxia no indication for remdesivir or baricitinib. -Continue to trend inflammatory markers.  Atrial fibrillation, new onset -Most likely due to acute illness from COVID-19, as well electrolyte abnormalities she will be monitored on telemetry, she is already on full anticoagulation, she is already on atenolol , can be changed to Coreg or metoprolol if she needs better heart rate control.  Hypertension -Continue with home medications  History of DVT -Continue with Xarelto.  History of COPD -No wheezing, continue with scheduled Combivent and home medications.  Hypokalemia -We will replete, recheck in a.m.Marland Kitchen  Hyponatremia -Volume depletion, continue with IV fluids  Prolonged QTC -Due to electrolyte abnormalities, avoid prolonging agents, monitor on telemetry, correct hypokalemia, check magnesium level, I have already ordered 1 g of magnesium sulfate.  DVT Prophylaxis : On Xarelto  AM Labs Ordered, also please review Full Orders  Family Communication: Admission, patients condition and plan of care including  tests being ordered have been discussed with the patient who indicate understanding and agree with the plan and Code Status.  Code Status Full  Likely DC to  home  Condition GUARDED   Consults called: none    Admission status: inpatient    Time spent in minutes : 60 minutes   Phillips Climes M.D on 12/31/2019 at 5:07 PM   [facility] - Office  [phone]

## 2019-12-31 NOTE — ED Provider Notes (Addendum)
[facility] EMERGENCY DEPARTMENT Provider Note   CSN: [id number] Arrival date & time: 12/31/19  1305     History Chief Complaint  Patient presents with  . Emesis    Paula Klein is a 74 y.o. female.  HPI  Patient w ~2wk of illness and diagnosis one week ago w COVID now p/w weakness, nausea and vomiting.  There is some associated dyspnea, though her weakness seems dominant.  She has been unable to tolerate almost any PO intake for one week.  The are associated chills, no clear fever.  No discrete pain though she c/o diffuse myalgia.  No vaccines.  She is taking meds as much as possible, including her Xarelto. She is a former smoker. One week ago, on the day of diagnosis, she received antibody infusion.    Past Medical History:  Diagnosis Date  . H/O blood clots   . Hypertension     Patient Active Problem List   Diagnosis Date Noted  . Acute bronchitis 03/02/2018  . Varicose veins of both lower extremities with inflammation 08/31/2017  . Chronic venous insufficiency 08/31/2017  . Swelling of limb 08/31/2017    Past Surgical History:  Procedure Laterality Date  . COLONOSCOPY WITH PROPOFOL N/A 09/26/2015   Procedure: COLONOSCOPY WITH PROPOFOL;  Surgeon: Lollie Sails, MD;  Location: [facility] ENDOSCOPY;  Service: Endoscopy;  Laterality: N/A;  . NO PAST SURGERIES       OB History   No obstetric history on file.     Family History  Problem Relation Age of Onset  . Varicose Veins Mother   . Heart attack Father   . Cancer Brother     Social History   Tobacco Use  . Smoking status: Former Smoker    Packs/day: 3.00    Years: 30.00    Pack years: 90.00    Quit date: 02/03/1989    Years since quitting: 30.9  . Smokeless tobacco: Never Used  Vaping Use  . Vaping Use: Never used  Substance Use Topics  . Alcohol use: No  . Drug use: No    Home Medications Prior to Admission medications   Medication Sig Start Date End Date Taking? Authorizing Provider    albuterol (PROVENTIL HFA;VENTOLIN HFA) 108 (90 Base) MCG/ACT inhaler Inhale 2 puffs into the lungs every 6 (six) hours as needed for shortness of breath.    [provider]  ALPRAZolam Duanne Moron) 0.5 MG tablet Take by mouth. 08/18/19   [provider]  Ascorbic Acid (VITAMIN C) 1000 MG tablet Take 1,000 mg by mouth daily.     [provider]  atenolol (TENORMIN) 25 MG tablet Take 25 mg by mouth daily.    [provider]  calcium carbonate (OS-CAL - DOSED IN MG OF ELEMENTAL CALCIUM) 1250 (500 Ca) MG tablet Take 1 tablet by mouth daily.    [provider]  Cholecalciferol (VITAMIN D3 PO) Take 500 Units by mouth daily.    [provider]  clonazePAM (KLONOPIN) 0.5 MG tablet Take 0.5 mg by mouth daily as needed for anxiety.    [provider]  ELIQUIS 5 MG TABS tablet Take 5 mg by mouth 2 (two) times daily.  Patient not taking: Reported on 12/03/2019 08/07/17   [provider]  estradiol (ESTRACE) 0.5 MG tablet Take 0.5 mg by mouth daily.    [provider]  fluticasone furoate-vilanterol (BREO ELLIPTA) 100-25 MCG/INH AEPB Inhale 1 puff into the lungs daily. 08/17/18   Wilhelmina Mcardle, MD  glucosamine-chondroitin 500-400 MG tablet Take 1 tablet by mouth 2 (two) times daily.     [provider]  lisinopril-hydrochlorothiazide (PRINZIDE,ZESTORETIC) 20-12.5 MG tablet Take 1 tablet by mouth 2 (two) times daily.     [provider]  loratadine (CLARITIN) 10 MG tablet Take 10 mg by mouth daily.    [provider]  magnesium oxide (MAG-OX) 400 MG tablet Take 400 mg by mouth daily.     [provider]  medroxyPROGESTERone (PROVERA) 2.5 MG tablet Take 2.5 mg by mouth daily.    [provider]  Omega-3 Fatty Acids (FISH OIL PO) Take 1,200 mg by mouth daily.     [provider]  ondansetron (ZOFRAN ODT) 4 MG disintegrating tablet Take 1 tablet (4 mg total) by mouth every 8 (eight) hours  as needed for nausea. 12/26/19   Noemi Chapel, MD  polyethylene glycol powder [facility]) 17 GM/SCOOP powder Take by mouth.    [provider]  potassium chloride SA (KLOR-CON) 20 MEQ tablet Take 1 tablet (20 mEq total) by mouth daily for 5 days. 12/26/19 12/31/19  Noemi Chapel, MD  quiNINE Orlin Hilding) 324 MG capsule  03/23/19   [provider]  tiotropium (SPIRIVA) 18 MCG inhalation capsule Place 18 mcg into inhaler and inhale daily.    [provider]  vitamin E 1000 UNIT capsule Take 1,000 Units by mouth daily.    [provider]  XARELTO 10 MG TABS tablet Take 10 mg by mouth daily.  09/20/19   [provider]    Allergies    Pine tar, Sulfa antibiotics, and Tetracyclines & related  Review of Systems   Review of Systems  Constitutional:       Per HPI, otherwise negative  HENT:       Per HPI, otherwise negative  Respiratory:       Per HPI, otherwise negative  Cardiovascular:       Per HPI, otherwise negative  Gastrointestinal: Positive for nausea and vomiting. Negative for abdominal pain.  Endocrine:       Negative aside from HPI  Genitourinary:       Neg aside from HPI   Musculoskeletal:       Per HPI, otherwise negative  Skin: Negative.   Allergic/Immunologic: Negative for immunocompromised state.  Neurological: Positive for weakness. Negative for syncope.  Hematological: Bruises/bleeds easily.    Physical Exam Updated Vital Signs BP 136/83 (BP Location: Right Arm)   Pulse (!) 109   Temp 98.1 F (36.7 C) (Oral)   Resp 18   Ht 5\' 2"  (1.575 m)   Wt 68.5 kg   SpO2 93%   BMI 27.62 kg/m   Physical Exam Vitals and nursing note reviewed.  Constitutional:      General: She is not in acute distress.    Appearance: She is well-developed. She is ill-appearing.  HENT:     Head: Normocephalic and atraumatic.  Eyes:     Conjunctiva/sclera: Conjunctivae normal.  Cardiovascular:     Rate and Rhythm: Regular rhythm.  Tachycardia present.  Pulmonary:     Effort: Pulmonary effort is normal. No respiratory distress.     Breath sounds: Normal breath sounds. No stridor.  Abdominal:     General: There is no distension.  Skin:    General: Skin is warm and dry.     Comments: Mult small areas of ecchymosis.  Neurological:     Mental Status: She is alert and oriented to person, place, and time.  Cranial Nerves: No cranial nerve deficit.      ED Results / Procedures / Treatments   Labs (all labs ordered are listed, but only abnormal results are displayed) Labs Reviewed  COMPREHENSIVE METABOLIC PANEL - Abnormal; Notable for the following components:      Result Value   Sodium 131 (*)    Potassium 2.8 (*)    Chloride 94 (*)    Calcium 8.6 (*)    Albumin 3.0 (*)    All other components within normal limits  CBC - Abnormal; Notable for the following components:   RDW 17.1 (*)    Platelets 405 (*)    All other components within normal limits  URINALYSIS, ROUTINE W REFLEX MICROSCOPIC - Abnormal; Notable for the following components:   APPearance HAZY (*)    Hgb urine dipstick SMALL (*)    Ketones, ur 20 (*)    Protein, ur 30 (*)    All other components within normal limits  CULTURE, BLOOD (ROUTINE X 2)  CULTURE, BLOOD (ROUTINE X 2)  LIPASE, BLOOD  LACTIC ACID, PLASMA  LACTIC ACID, PLASMA  D-DIMER, QUANTITATIVE (NOT AT [facility])  PROCALCITONIN  LACTATE DEHYDROGENASE  FERRITIN  TRIGLYCERIDES  FIBRINOGEN  C-REACTIVE PROTEIN    EKG EKG Interpretation  Date/Time:  Friday December 31 2019 16:52:47 EDT Ventricular Rate:  104 PR Interval:    QRS Duration: 78 QT Interval:  407 QTC Calculation: 536 R Axis:   50 Text Interpretation: Atrial fibrillation Borderline repolarization abnormality Prolonged QT interval Baseline wander Abnormal ECG Confirmed by Carmin Muskrat [phone]) on 12/31/2019 4:56:16 PM   Radiology DG Chest Port 1 View  Result Date: 12/31/2019 CLINICAL DATA:  Shortness of  breath, COVID-19 positive EXAM: PORTABLE CHEST 1 VIEW COMPARISON:  12/26/2019 FINDINGS: Stable cardiomediastinal contours. Interval progression of heterogeneous opacities throughout both lungs, with a predominantly peripheral distribution. No pleural effusion or pneumothorax. IMPRESSION: Interval progression of bilateral heterogeneous airspace opacities, most prominent in the peripheral distribution, concerning for worsening multifocal atypical/viral pneumonia. Electronically Signed   By: Davina Poke D.O.   On: 12/31/2019 15:36    Procedures Procedures (including critical care time)  Medications Ordered in ED Medications  lactated ringers bolus 1,000 mL (has no administration in time range)  metoCLOPramide (REGLAN) injection 10 mg (has no administration in time range)    ED Course  I have reviewed the triage vital signs and the nursing notes.  Pertinent labs & imaging results that were available during my care of the patient were reviewed by me and considered in my medical decision making (see chart for details).  EMR reviewed - antibodies provided one week ago, and COVID + at that time.  Pulse oximetry - 89% RA > 99% 2L [place].  4:40 PM Patient now on 2 L via nasal cannula, has appropriate saturation. Cardiac monitor shows no obvious arrhythmia, sinus, mid 90s and on second view possible afib - abnormal. I reviewed the x-ray, concern for worsening bilateral opacities.  Adult female with roughly 2 weeks of illness, 1 week diagnosis of Covid now presents with worsening weakness, sign of new hypoxia, requiring supplemental oxygen. In addition, patient found to have new hypokalemia, beyond her baseline.  Given need for ongoing resuscitation for hyperkalemia, oxygen is above, she will require admission. In the ED patient also prepped to receive remdesivir and steroids.    MDM Rules/Calculators/A&P MDM Number of Diagnoses or Management Options Pneumonia due to COVID-19 virus:  established, worsening   Amount and/or Complexity of Data Reviewed Clinical  lab tests: reviewed Tests in the radiology section of CPT: reviewed Tests in the medicine section of CPT: reviewed Decide to obtain previous medical records or to obtain history from someone other than the patient: yes Review and summarize past medical records: yes Discuss the patient with other providers: yes Independent visualization of images, tracings, or specimens: yes  Risk of Complications, Morbidity, and/or Mortality Presenting problems: high Diagnostic procedures: high Management options: high  Critical Care Total time providing critical care: < 30 minutes  Patient Progress Patient progress: stable  Final Clinical Impression(s) / ED Diagnoses Final diagnoses:  Pneumonia due to COVID-19 virus     Carmin Muskrat, MD 12/31/19 1642    Carmin Muskrat, MD 12/31/19 1657

## 2019-12-31 NOTE — Progress Notes (Signed)
Instructed patient on usage and reason for usage on IS.  Patient was able to complete 10X with new goal of almost 1000 ml.  Placed IS at bedside for patient to use later.

## 2019-12-31 NOTE — ED Notes (Signed)
PT report called to [facility] RN, verbalized complete understanding of Pt plan of care and current condition denies questions at this time. PT resting on stretcher rails up x 2 call light in hand. Pt continues on cardiac monitor, [place] @ 2L and is tolerating well. Pt VSS NAD PT IV healthy intact with clean dry dressing. Pt is hemodynamically stable at this time and awaiting transport to clean ready room via stretcher, following strict Covid PPE protocol.

## 2020-01-01 DIAGNOSIS — E876 Hypokalemia: Secondary | ICD-10-CM

## 2020-01-01 DIAGNOSIS — J9601 Acute respiratory failure with hypoxia: Secondary | ICD-10-CM

## 2020-01-01 DIAGNOSIS — E871 Hypo-osmolality and hyponatremia: Secondary | ICD-10-CM

## 2020-01-01 LAB — CBC WITH DIFFERENTIAL/PLATELET
Abs Immature Granulocytes: 0.04 10*3/uL (ref 0.00–0.07)
Basophils Absolute: 0 10*3/uL (ref 0.0–0.1)
Basophils Relative: 0 %
Eosinophils Absolute: 0 10*3/uL (ref 0.0–0.5)
Eosinophils Relative: 0 %
HCT: 38.5 % (ref 36.0–46.0)
Hemoglobin: 12.2 g/dL (ref 12.0–15.0)
Immature Granulocytes: 1 %
Lymphocytes Relative: 6 %
Lymphs Abs: 0.3 10*3/uL — ABNORMAL LOW (ref 0.7–4.0)
MCH: 25.9 pg — ABNORMAL LOW (ref 26.0–34.0)
MCHC: 31.7 g/dL (ref 30.0–36.0)
MCV: 81.7 fL (ref 80.0–100.0)
Monocytes Absolute: 0.2 10*3/uL (ref 0.1–1.0)
Monocytes Relative: 4 %
Neutro Abs: 5.2 10*3/uL (ref 1.7–7.7)
Neutrophils Relative %: 89 %
Platelets: 483 10*3/uL — ABNORMAL HIGH (ref 150–400)
RBC: 4.71 MIL/uL (ref 3.87–5.11)
RDW: 16.9 % — ABNORMAL HIGH (ref 11.5–15.5)
WBC: 5.8 10*3/uL (ref 4.0–10.5)
nRBC: 0 % (ref 0.0–0.2)

## 2020-01-01 LAB — GLUCOSE, CAPILLARY
Glucose-Capillary: 127 mg/dL — ABNORMAL HIGH (ref 70–99)
Glucose-Capillary: 191 mg/dL — ABNORMAL HIGH (ref 70–99)
Glucose-Capillary: 158 mg/dL — ABNORMAL HIGH (ref 70–99)

## 2020-01-01 LAB — COMPREHENSIVE METABOLIC PANEL
ALT: 35 U/L (ref 0–44)
AST: 26 U/L (ref 15–41)
Albumin: 2.8 g/dL — ABNORMAL LOW (ref 3.5–5.0)
Alkaline Phosphatase: 71 U/L (ref 38–126)
Anion gap: 11 (ref 5–15)
BUN: 13 mg/dL (ref 8–23)
CO2: 23 mmol/L (ref 22–32)
Calcium: 8.6 mg/dL — ABNORMAL LOW (ref 8.9–10.3)
Chloride: 99 mmol/L (ref 98–111)
Creatinine, Ser: 0.6 mg/dL (ref 0.44–1.00)
GFR, Estimated: >60 mL/min (ref >60)
Glucose, Bld: 145 mg/dL — ABNORMAL HIGH (ref 70–99)
Potassium: 4.6 mmol/L (ref 3.5–5.1)
Sodium: 133 mmol/L — ABNORMAL LOW (ref 135–145)
Total Bilirubin: 0.6 mg/dL (ref 0.3–1.2)
Total Protein: 6.5 g/dL (ref 6.5–8.1)

## 2020-01-01 LAB — C-REACTIVE PROTEIN: CRP: 20.5 mg/dL — ABNORMAL HIGH (ref <1.0)

## 2020-01-01 LAB — D-DIMER, QUANTITATIVE: D-Dimer, Quant: 2.37 ug/mL-FEU — ABNORMAL HIGH (ref 0.00–0.50)

## 2020-01-01 LAB — MAGNESIUM: Magnesium: 2.2 mg/dL (ref 1.7–2.4)

## 2020-01-01 LAB — HEMOGLOBIN A1C
Hgb A1c MFr Bld: 5.7 % — ABNORMAL HIGH (ref 4.8–5.6)
Mean Plasma Glucose: 116.89 mg/dL

## 2020-01-01 NOTE — Progress Notes (Signed)
PROGRESS NOTE    Paula Klein  ION:[id number] DOB: 01/09/1946 DOA: 12/31/2019 PCP: Marguerita Merles, MD    Chief Complaint  Patient presents with  . Emesis    Brief Narrative:  As per H&P written by Dr. Waldron Labs on 12/31/2019  74 y.o. female, with past medical history of COPD, hypertension, history of DVT on Xarelto, patient with Covid symptoms for last 2 weeks, she tested +10/24, were she received monoclonal antibody, reports worsening symptoms over last 3 days, nausea, vomiting, generalized weakness and poor oral intake, she denies chills, but no clear fevers, she reports generalized body ache, he does report dyspnea, she is unvaccinated against Covid, she denies any hemoptysis, diarrhea. - in ED patient was saturating 89% on room air for which she was started on nasal cannula, she had multiple electrolyte abnormalities including hyponatremia at 131, hypokalemia at 2.8, this x-ray showing worsening multifocal opacity, started on steroids, Remdesivir, and [facility] consulted to admit.  Assessment & Plan: 1-acute hypoxic respiratory failure secondary to COVID-19 pneumonia -Patient is unvaccinated -Received monoclonal antibody on 12/26/2019; despite these continue experiencing worsening of symptoms and found to be hypoxic with worsening chest x-ray findings on admission. -Currently no requiring oxygen supplementation at rest; still with hypoxia with activity. -No chest pain, no nausea, no vomiting. -Still with general malaise but feeling better -Continue IV steroids and remdesivir -Continue the use of incentive respirometer/flutter valve, as needed antitussive medication and bronchodilator -Follow-up laboratory markers. -Continue to wean off oxygen supplementation as tolerated and assess for desaturation screening.  2-atrial fibrillation -According to patient has been told of ongoing arrhythmia in the past -Currently stable in sinus -Rate controlled -Continue  beta-blocker -Continue Xarelto  3-prior history of DVT -Continue chronic anticoagulation.  4-history of COPD -Currently no wheezing-continue bronchodilator management this condition increase patient chances for undesired prognosis in the setting of Covid infection.  5-hypokalemia -Repleted -Continue to follow electrolytes trend  6-hyponatremia -Improved after fluid resuscitation and essentially back to normal. -Continue to follow trend.  7-history of hypertension -Stable and well-controlled-continue current antihypertensive agents.  8-prolonged QT -Continue monitoring on telemetry -Avoid medications that can further prolong QT interval.   DVT prophylaxis: Chronically on Xarelto Code Status: Full code Family Communication: No family at bedside.  Patient expressed that she will update her boyfriend over the phone by herself. Disposition:   Status is: Inpatient  Dispo: The patient is from: Home              Anticipated d/c is to: Home              Anticipated d/c date is: 1-2 days.              Patient currently is not medically stable for discharge at this point; still with ongoing general malaise, shortness of breath on activity and experiencing intermittent coughing spells.  We will continue IV steroids and remdesivir; continue antitussive medications, adequate hydration and follow clinical response.  Hopefully home in the next 1 to 2 days.       Consultants:   None   Procedures:  See below for x-ray reports.  Antimicrobials/antiviral:  Remdesivir  Subjective: Reported intermittent coughing spells and shortness of breath with activity.  Patient reports general malaise.  No chest pain, no nausea and no vomiting currently..  Objective: Vitals:   01/01/20 0521 01/01/20 0746 01/01/20 1349 01/01/20 1418  BP: (!) 154/103   121/79  Pulse: 63   (!) 105  Resp: 17   19  Temp:    98.3 F (36.8 C)  TempSrc:    Oral  SpO2: 94% 94% 92% 97%  Weight:      Height:         Intake/Output Summary (Last 24 hours) at 01/01/2020 1554 Last data filed at 01/01/2020 1300 Gross per 24 hour  Intake 580 ml  Output --  Net 580 ml   Filed Weights   12/31/19 1336 12/31/19 2044  Weight: 68.5 kg 68.6 kg    Examination:  General exam: Appears calm and comfortable; no fever, no chest pain, no nausea or vomiting.  Reports feeling better today, less short of breath.  Still with intermittent coughing spells and expressing general malaise. Respiratory system: Positive rhonchi bilaterally, no using accessory muscle.  No wheezing on examination.  Mild tachypnea with activity appreciated. Cardiovascular system: S1 & S2 heard, RRR. No JVD, murmurs, rubs, gallops or clicks. No pedal edema. Gastrointestinal system: Abdomen is nondistended, soft and nontender. No organomegaly or masses felt. Normal bowel sounds heard. Central nervous system: Alert and oriented. No focal neurological deficits. Extremities: No cyanosis or clubbing. Skin: No petechiae. Psychiatry: Judgement and insight appear normal. Mood & affect appropriate.     Data Reviewed: I have personally reviewed following labs and imaging studies  CBC: Recent Labs  Lab 12/26/19 1131 12/31/19 1434 01/01/20 0637  WBC 4.4 9.3 5.8  NEUTROABS 3.6  --  5.2  HGB 13.7 12.5 12.2  HCT 42.3 38.5 38.5  MCV 80.1 81.4 81.7  PLT 197 405* 483*    Basic Metabolic Panel: Recent Labs  Lab 12/26/19 1131 12/31/19 1434 12/31/19 1708 01/01/20 0637  NA 127* 131*  --  133*  K 3.0* 2.8*  --  4.6  CL 89* 94*  --  99  CO2 28 25  --  23  GLUCOSE 100* 93  --  145*  BUN 13 12  --  13  CREATININE 1.11* 0.82  --  0.60  CALCIUM 8.3* 8.6*  --  8.6*  MG  --   --  2.0 2.2    GFR: Estimated Creatinine Clearance: 56 mL/min (by C-G formula based on SCr of 0.6 mg/dL).  Liver Function Tests: Recent Labs  Lab 12/26/19 1131 12/31/19 1434 01/01/20 0637  AST 38 34 26  ALT 26 37 35  ALKPHOS 54 72 71  BILITOT 0.4 0.8 0.6  PROT  6.9 6.7 6.5  ALBUMIN 3.6 3.0* 2.8*    CBG: Recent Labs  Lab 12/31/19 2210 01/01/20 0903 01/01/20 1205  GLUCAP 142* 127* 191*     Recent Results (from the past 240 hour(s))  Respiratory Panel by RT PCR (Flu A&B, Covid) - Nasopharyngeal Swab     Status: Abnormal   Collection Time: 12/26/19 11:32 AM   Specimen: Nasopharyngeal Swab  Result Value Ref Range Status   SARS Coronavirus 2 by RT PCR POSITIVE (A) NEGATIVE Final    Comment: RESULT CALLED TO, READ BACK BY AND VERIFIED WITH: HOLCULM R. @ 1503 ON 102421 BY HENDERSON L. (NOTE) SARS-CoV-2 target nucleic acids are DETECTED.  SARS-CoV-2 RNA is generally detectable in upper respiratory specimens  during the acute phase of infection. Positive results are indicative of the presence of the identified virus, but do not rule out bacterial infection or co-infection with other pathogens not detected by the test. Clinical correlation with patient history and other diagnostic information is necessary to determine patient infection status. The expected result is Negative.  Fact Sheet for Patients:  PinkCheek.be  Fact Sheet for  Healthcare Providers: GravelBags.it  This test is not yet approved or cleared by the [place] and  has been authorized for detection and/or diagnosis of SARS-CoV-2 by FDA under an Emergency Use Authorization (EUA).  This EUA will remain in effect (meaning this tes t can be used) for the duration of  the COVID-19 declaration under Section 564(b)(1) of the Act, 21 U.S.C. section 360bbb-3(b)(1), unless the authorization is terminated or revoked sooner.      Influenza A by PCR NEGATIVE NEGATIVE Final   Influenza B by PCR NEGATIVE NEGATIVE Final    Comment: (NOTE) The Xpert Xpress SARS-CoV-2/FLU/RSV assay is intended as an aid in  the diagnosis of influenza from Nasopharyngeal swab specimens and  should not be used as a sole basis for  treatment. Nasal washings and  aspirates are unacceptable for Xpert Xpress SARS-CoV-2/FLU/RSV  testing.  Fact Sheet for Patients: PinkCheek.be  Fact Sheet for Healthcare Providers: GravelBags.it  This test is not yet approved or cleared by the [place] FDA and  has been authorized for detection and/or diagnosis of SARS-CoV-2 by  FDA under an Emergency Use Authorization (EUA). This EUA will remain  in effect (meaning this test can be used) for the duration of the  Covid-19 declaration under Section 564(b)(1) of the Act, 21  U.S.C. section 360bbb-3(b)(1), unless the authorization is  terminated or revoked. Performed at [facility], [address], [place], [place] [address]       Radiology Studies: [facility] 1 View  Result Date: 12/31/2019 CLINICAL DATA:  Shortness of breath, COVID-19 positive EXAM: PORTABLE CHEST 1 VIEW COMPARISON:  12/26/2019 FINDINGS: Stable cardiomediastinal contours. Interval progression of heterogeneous opacities throughout both lungs, with a predominantly peripheral distribution. No pleural effusion or pneumothorax. IMPRESSION: Interval progression of bilateral heterogeneous airspace opacities, most prominent in the peripheral distribution, concerning for worsening multifocal atypical/viral pneumonia. Electronically Signed   By: Davina Poke D.O.   On: 12/31/2019 15:36    Scheduled Meds: . atenolol  25 mg Oral Daily  . fluticasone furoate-vilanterol  1 puff Inhalation Daily  . insulin aspart  0-5 Units Subcutaneous QHS  . insulin aspart  0-9 Units Subcutaneous TID WC  . Ipratropium-Albuterol  1 puff Inhalation Q6H  . methylPREDNISolone (SOLU-MEDROL) injection  1 mg/kg Intravenous Q12H   Followed by  . [START ON 01/03/2020] predniSONE  50 mg Oral Daily  . omega-3 acid ethyl esters  1 g Oral BID  . pantoprazole  40 mg Oral Daily  . rivaroxaban  20 mg Oral Q supper  . umeclidinium bromide   1 puff Inhalation Daily   Continuous Infusions: . remdesivir 100 mg in NS 100 mL Stopped (01/01/20 1015)     LOS: 1 day    Time spent: 30 minutes.    Barton Dubois, MD [facility]   To contact the attending provider between 7A-7P or the covering provider during after hours 7P-7A, please log into the web site [email or web address] and access using universal [place] password for that web site. If you do not have the password, please call the hospital operator.  01/01/2020, 3:54 PM

## 2020-01-02 LAB — CBC WITH DIFFERENTIAL/PLATELET
Abs Immature Granulocytes: 0.21 10*3/uL — ABNORMAL HIGH (ref 0.00–0.07)
Basophils Absolute: 0 10*3/uL (ref 0.0–0.1)
Basophils Relative: 0 %
Eosinophils Absolute: 0 10*3/uL (ref 0.0–0.5)
Eosinophils Relative: 0 %
HCT: 39.9 % (ref 36.0–46.0)
Hemoglobin: 12.5 g/dL (ref 12.0–15.0)
Immature Granulocytes: 1 %
Lymphocytes Relative: 5 %
Lymphs Abs: 1 10*3/uL (ref 0.7–4.0)
MCH: 25.8 pg — ABNORMAL LOW (ref 26.0–34.0)
MCHC: 31.3 g/dL (ref 30.0–36.0)
MCV: 82.4 fL (ref 80.0–100.0)
Monocytes Absolute: 0.8 10*3/uL (ref 0.1–1.0)
Monocytes Relative: 4 %
Neutro Abs: 16.9 10*3/uL — ABNORMAL HIGH (ref 1.7–7.7)
Neutrophils Relative %: 90 %
Platelets: 521 10*3/uL — ABNORMAL HIGH (ref 150–400)
RBC: 4.84 MIL/uL (ref 3.87–5.11)
RDW: 17.2 % — ABNORMAL HIGH (ref 11.5–15.5)
WBC: 18.9 10*3/uL — ABNORMAL HIGH (ref 4.0–10.5)
nRBC: 0 % (ref 0.0–0.2)

## 2020-01-02 LAB — COMPREHENSIVE METABOLIC PANEL
ALT: 36 U/L (ref 0–44)
AST: 28 U/L (ref 15–41)
Albumin: 2.9 g/dL — ABNORMAL LOW (ref 3.5–5.0)
Alkaline Phosphatase: 71 U/L (ref 38–126)
Anion gap: 11 (ref 5–15)
BUN: 20 mg/dL (ref 8–23)
CO2: 25 mmol/L (ref 22–32)
Calcium: 9.1 mg/dL (ref 8.9–10.3)
Chloride: 102 mmol/L (ref 98–111)
Creatinine, Ser: 0.7 mg/dL (ref 0.44–1.00)
GFR, Estimated: >60 mL/min (ref >60)
Glucose, Bld: 166 mg/dL — ABNORMAL HIGH (ref 70–99)
Potassium: 4.3 mmol/L (ref 3.5–5.1)
Sodium: 138 mmol/L (ref 135–145)
Total Bilirubin: 0.7 mg/dL (ref 0.3–1.2)
Total Protein: 6.3 g/dL — ABNORMAL LOW (ref 6.5–8.1)

## 2020-01-02 LAB — GLUCOSE, CAPILLARY
Glucose-Capillary: 154 mg/dL — ABNORMAL HIGH (ref 70–99)
Glucose-Capillary: 184 mg/dL — ABNORMAL HIGH (ref 70–99)
Glucose-Capillary: 231 mg/dL — ABNORMAL HIGH (ref 70–99)

## 2020-01-02 LAB — D-DIMER, QUANTITATIVE: D-Dimer, Quant: 2.89 ug/mL-FEU — ABNORMAL HIGH (ref 0.00–0.50)

## 2020-01-02 LAB — C-REACTIVE PROTEIN: CRP: 8 mg/dL — ABNORMAL HIGH (ref <1.0)

## 2020-01-02 MED ORDER — PANTOPRAZOLE SODIUM 40 MG PO TBEC
40.0000 mg | DELAYED_RELEASE_TABLET | Freq: Every day | ORAL | 1 refills | Status: DC
Start: 2020-01-03 — End: 2020-01-11

## 2020-01-02 MED ORDER — LISINOPRIL-HYDROCHLOROTHIAZIDE 20-12.5 MG PO TABS: 1.0000 | ORAL_TABLET | Freq: Every day | ORAL | Status: DC

## 2020-01-02 MED ORDER — PREDNISONE 20 MG PO TABS
ORAL_TABLET | ORAL | 0 refills | Status: DC
Start: 2020-01-02 — End: 2020-02-01

## 2020-01-02 MED ORDER — XARELTO 10 MG PO TABS: 20.0000 mg | ORAL_TABLET | Freq: Every day | ORAL | Status: DC

## 2020-01-02 NOTE — Discharge Summary (Signed)
Physician Discharge Summary  SRI CLEGG HCW:[id number] DOB: 09/24/1945 DOA: 12/31/2019  PCP: Marguerita Merles, MD  Admit date: 12/31/2019 Discharge date: 01/02/2020  Time spent: 35 minutes  Recommendations for Outpatient Follow-up:  1. Repeat basic metabolic panel to follow twice renal function 2. Reassess blood pressure and adjust antihypertensive treatment as needed 3. Repeat chest x-ray in 4 weeks to assure complete resolution of infiltrates   Discharge Diagnoses:  Active Problems:   Essential hypertension   History of DVT of lower extremity   Pneumonia due to COVID-19 virus History of atrial fibrillation Prolonged QT Hypertension Hyponatremia Hypokalemia COPD  Discharge Condition: Stable and improved.  Discharged home with instructions to complete steroids tapering and to follow-up at outpatient remdesivir infusion center for 2 more dosages.  Follow-up with PCP in 2 weeks.  CODE STATUS: Full code.  Diet recommendation: Heart healthy diet.  Filed Weights   12/31/19 1336 12/31/19 2044  Weight: 68.5 kg 68.6 kg    History of present illness:  As per H&P written by Dr. Waldron Labs on 12/31/2019 74 y.o.female,with past medical history of COPD, hypertension, history of DVT on Xarelto, patient with Covid symptoms for last 2 weeks, she tested +10/24, were she received monoclonal antibody, reports worsening symptoms over last 3 days, nausea, vomiting, generalized weakness and poor oral intake, she denies chills, but no clear fevers, she reports generalized body ache, he does report dyspnea, she is unvaccinated against Covid, she denies any hemoptysis, diarrhea. - in EDpatient was saturating 89% on room air for which she was started on nasal cannula, she had multiple electrolyte abnormalities including hyponatremia at 131, hypokalemia at 2.8, this x-ray showing worsening multifocal opacity, started on steroids, Remdesivir, and [facility] hospitalist consulted to admit.  Hospital  Course:  1-acute hypoxic respiratory failure secondary to COVID-19 pneumonia -Patient is unvaccinated -Received monoclonal antibody on 12/26/2019; despite these continue experiencing worsening of symptoms and found to be hypoxic with worsening chest x-ray findings on admission. -No chest pain, no nausea, no vomiting.  Reports feeling much better, with improved breathing.  Would like to go home. -Patient has been discharged with instructions for steroids tapering -2 more doses of IV remdesivir infusion has been arranged for patient on 01/03/2020 and 01-04-20 to complete therapy. -Continue the use of incentive respirometer/flutter valve, and continue also as needed antitussive medication and bronchodilators. -Patient is stable and currently not requiring oxygen supplementation at rest.  Feeling ready to go home.  2-history of atrial fibrillation -According to patient has been told of ongoing arrhythmia in the past -Currently stable and in sinus rhythm -Continue beta-blocker -Continue Xarelto  3-prior history of DVT -Continue chronic anticoagulation.  4-history of COPD -Currently no wheezing -continue bronchodilator management  -No requiring oxygen supplementation at rest at time of discharge.  5-hypokalemia -Repleted and is stable at time of discharge. -Patient daily potassium supplementation has been resumed -Repeat basic metabolic panel to evaluate lites trend and follow BC.  6-hyponatremia -Improved after fluid resuscitation and essentially back to normal at time of discharge.. -Repeat basic metabolic panel to follow electrolytes trend.  7-history of hypertension -continue current antihypertensive agents at adjusted dosages. -Reassess blood pressure and further adjust medication as needed. -Heart healthy diet has been encouraged.  8-prolonged QT -Continue monitoring intermittently. -Avoid medications that can further prolong QT interval.   Procedures: See below for  x-ray reports.  Consultations:  None  Discharge Exam: Vitals:   01/02/20 0329 01/02/20 0745  BP:    Pulse: 99   Resp:  Temp:    SpO2:  90%    General: Afebrile, no chest pain, no nausea, no vomiting.  Reports feeling significantly better and no requiring oxygen supplementation at rest (patient expressed at home she has oxygen and uses as needed).  Tolerating diet clean no acute distress.  Would like to go home. Cardiovascular: S1-S2, no rubs, no gallops; no JVD on exam. Respiratory: Positive scattered rhonchi, no wheezing, no using accessory muscles. Normal respiratory effort. Abdomen: Soft, nontender, nondistended, positive bowel sounds. Extremities: No cyanosis or clubbing.   Discharge Instructions   Discharge Instructions    Diet - low sodium heart healthy   Complete by: As directed    Discharge instructions   Complete by: As directed    Take medication as prescribed Maintain adequate hydration Follow heart healthy diet Follow-up as instructed for remdesivir infusions as an outpatient on 01/03/2020 and 11-21 -Arrange follow-up with PCP in 2 weeks.     Allergies as of 01/02/2020      Reactions   Pine Tar Cough   Sulfa Antibiotics Rash   Tetracyclines & Related Rash      Medication List    STOP taking these medications   Eliquis 5 MG Tabs tablet Generic drug: apixaban   glucosamine-chondroitin 500-400 MG tablet   medroxyPROGESTERone 2.5 MG tablet Commonly known as: PROVERA     TAKE these medications   albuterol 108 (90 Base) MCG/ACT inhaler Commonly known as: VENTOLIN HFA Inhale 2 puffs into the lungs every 6 (six) hours as needed for shortness of breath.   ALPRAZolam 0.5 MG tablet Commonly known as: XANAX Take by mouth.   atenolol 25 MG tablet Commonly known as: TENORMIN Take 25 mg by mouth daily.   Breo Ellipta 100-25 MCG/INH Aepb Generic drug: fluticasone furoate-vilanterol Inhale 1 puff into the lungs daily.   calcium carbonate 1250 (500  Ca) MG tablet Commonly known as: OS-CAL - dosed in mg of elemental calcium Take 1 tablet by mouth daily.   clonazePAM 0.5 MG tablet Commonly known as: KLONOPIN Take 0.5 mg by mouth daily as needed for anxiety.   estradiol 0.5 MG tablet Commonly known as: ESTRACE Take 0.5 mg by mouth daily.   FISH OIL PO Take 1,200 mg by mouth daily.   lisinopril-hydrochlorothiazide 20-12.5 MG tablet Commonly known as: ZESTORETIC Take 1 tablet by mouth daily. What changed: when to take this   ondansetron 4 MG disintegrating tablet Commonly known as: Zofran ODT Take 1 tablet (4 mg total) by mouth every 8 (eight) hours as needed for nausea.   pantoprazole 40 MG tablet Commonly known as: PROTONIX Take 1 tablet (40 mg total) by mouth daily. Start taking on: January 03, 2020   polyethylene glycol powder 17 GM/SCOOP powder Commonly known as: GLYCOLAX/MIRALAX Take by mouth.   potassium chloride SA 20 MEQ tablet Commonly known as: KLOR-CON Take 1 tablet (20 mEq total) by mouth daily for 5 days.   predniSONE 20 MG tablet Commonly known as: DELTASONE Take 3 tablets by mouth daily x2 day; then take 2 tablet by mouth daily x2 days; then 1 tablet by mouth daily x3 days; then half tablet by mouth daily x3 days and stop prednisone.   quiNINE 324 MG capsule Commonly known as: QUALAQUIN   tiotropium 18 MCG inhalation capsule Commonly known as: SPIRIVA Place 18 mcg into inhaler and inhale daily.   vitamin C 1000 MG tablet Take 1,000 mg by mouth daily.   VITAMIN D3 PO Take 500 Units by mouth daily.  vitamin E 1000 UNIT capsule Take 1,000 Units by mouth daily.   Xarelto 10 MG Tabs tablet Generic drug: rivaroxaban Take 2 tablets (20 mg total) by mouth daily. What changed: how much to take      Allergies  Allergen Reactions  . Pine Tar Cough  . Sulfa Antibiotics Rash  . Tetracyclines & Related Rash    Follow-up Information    Marguerita Merles, MD. Schedule an appointment as soon as  possible for a visit in 10 day(s).   Specialty: Family Medicine Contact information: [place]  [phone]               The results of significant diagnostics from this hospitalization (including imaging, microbiology, ancillary and laboratory) are listed below for reference.    Significant Diagnostic Studies: DG Chest Port 1 View  Result Date: 12/31/2019 CLINICAL DATA:  Shortness of breath, COVID-19 positive EXAM: PORTABLE CHEST 1 VIEW COMPARISON:  12/26/2019 FINDINGS: Stable cardiomediastinal contours. Interval progression of heterogeneous opacities throughout both lungs, with a predominantly peripheral distribution. No pleural effusion or pneumothorax. IMPRESSION: Interval progression of bilateral heterogeneous airspace opacities, most prominent in the peripheral distribution, concerning for worsening multifocal atypical/viral pneumonia. Electronically Signed   By: Davina Poke D.O.   On: 12/31/2019 15:36   DG Chest Port 1 View  Result Date: 12/26/2019 CLINICAL DATA:  Cough for 1 week EXAM: PORTABLE CHEST 1 VIEW COMPARISON:  08/13/2018 FINDINGS: The heart size and mediastinal contours are within normal limits. Coarsened interstitial markings bilaterally. Streaky bibasilar opacities. No pleural effusion. No pneumothorax. The visualized skeletal structures are unremarkable. IMPRESSION: 1. Streaky bibasilar opacities may represent atelectasis or infection. 2. Coarsened interstitial markings bilaterally which could reflect bronchitic type lung changes, mild edema, or developing atypical/viral infection. Electronically Signed   By: Davina Poke D.O.   On: 12/26/2019 12:15    Microbiology: Recent Results (from the past 240 hour(s))  Respiratory Panel by RT PCR (Flu A&B, Covid) - Nasopharyngeal Swab     Status: Abnormal   Collection Time: 12/26/19 11:32 AM   Specimen: Nasopharyngeal Swab  Result Value Ref Range Status   SARS Coronavirus 2 by RT  PCR POSITIVE (A) NEGATIVE Final    Comment: RESULT CALLED TO, READ BACK BY AND VERIFIED WITH: HOLCULM R. @ 1503 ON 102421 BY HENDERSON L. (NOTE) SARS-CoV-2 target nucleic acids are DETECTED.  SARS-CoV-2 RNA is generally detectable in upper respiratory specimens  during the acute phase of infection. Positive results are indicative of the presence of the identified virus, but do not rule out bacterial infection or co-infection with other pathogens not detected by the test. Clinical correlation with patient history and other diagnostic information is necessary to determine patient infection status. The expected result is Negative.  Fact Sheet for Patients:  PinkCheek.be  Fact Sheet for Healthcare Providers: GravelBags.it  This test is not yet approved or cleared by the [place] FDA and  has been authorized for detection and/or diagnosis of SARS-CoV-2 by FDA under an Emergency Use Authorization (EUA).  This EUA will remain in effect (meaning this tes t can be used) for the duration of  the COVID-19 declaration under Section 564(b)(1) of the Act, 21 U.S.C. section 360bbb-3(b)(1), unless the authorization is terminated or revoked sooner.      Influenza A by PCR NEGATIVE NEGATIVE Final   Influenza B by PCR NEGATIVE NEGATIVE Final    Comment: (NOTE) The Xpert Xpress SARS-CoV-2/FLU/RSV assay is intended as an aid in  the  diagnosis of influenza from Nasopharyngeal swab specimens and  should not be used as a sole basis for treatment. Nasal washings and  aspirates are unacceptable for Xpert Xpress SARS-CoV-2/FLU/RSV  testing.  Fact Sheet for Patients: PinkCheek.be  Fact Sheet for Healthcare Providers: GravelBags.it  This test is not yet approved or cleared by the [place] FDA and  has been authorized for detection and/or diagnosis of SARS-CoV-2 by  FDA under  an Emergency Use Authorization (EUA). This EUA will remain  in effect (meaning this test can be used) for the duration of the  Covid-19 declaration under Section 564(b)(1) of the Act, 21  U.S.C. section 360bbb-3(b)(1), unless the authorization is  terminated or revoked. Performed at [facility], [address] [place], [place] [address]      Labs: Basic Metabolic Panel: Recent Labs  Lab 12/31/19 1434 12/31/19 1708 01/01/20 0637 01/02/20 0703  NA 131*  --  133* 138  K 2.8*  --  4.6 4.3  CL 94*  --  99 102  CO2 25  --  23 25  GLUCOSE 93  --  145* 166*  BUN 12  --  13 20  CREATININE 0.82  --  0.60 0.70  CALCIUM 8.6*  --  8.6* 9.1  MG  --  2.0 2.2  --    Liver Function Tests: Recent Labs  Lab 12/31/19 1434 01/01/20 0637 01/02/20 0703  AST 34 26 28  ALT 37 35 36  ALKPHOS 72 71 71  BILITOT 0.8 0.6 0.7  PROT 6.7 6.5 6.3*  ALBUMIN 3.0* 2.8* 2.9*   Recent Labs  Lab 12/31/19 1434  LIPASE 28   CBC: Recent Labs  Lab 12/31/19 1434 01/01/20 0637 01/02/20 0703  WBC 9.3 5.8 18.9*  NEUTROABS  --  5.2 16.9*  HGB 12.5 12.2 12.5  HCT 38.5 38.5 39.9  MCV 81.4 81.7 82.4  PLT 405* 483* 521*   CBG: Recent Labs  Lab 01/01/20 0903 01/01/20 1205 01/01/20 1805 01/01/20 2337 01/02/20 0914  GLUCAP 127* 191* 158* 184* 154*    Signed:  Barton Dubois MD.  [facility] 01/02/2020, 12:32 PM

## 2020-01-02 NOTE — Progress Notes (Signed)
AVS given to pt, all questions answered.  Instructions for infusion reviewed.  IV removed.  Taken to car by Chartered certified accountant.

## 2020-01-02 NOTE — Discharge Instructions (Signed)
Patient scheduled for outpatient Remdesivir infusions at 11am on Monday 11/1 and Tuesday 11/2 at [facility]. Please inform the patient to park at [place], as staff will be escorting the patient through the [place] entrance of the hospital. Appointments take approximately 45 minutes.    There is a wave flag banner located near the entrance on [address]. Turn into this entrance and immediately turn left or right and park in 1 of the 10 designated Covid Infusion Parking spots. There is a phone number on the sign, please call and let the staff know what spot you are in and we will come out and get you. For questions call [phone].  Thanks.

## 2020-01-02 NOTE — Progress Notes (Signed)
Patient scheduled for outpatient Remdesivir infusions at 11am on Monday 11/1 and Tuesday 11/2 at [facility]. Please inform the patient to park at [place], as staff will be escorting the patient through the [place] entrance of the hospital. Appointments take approximately 45 minutes.    There is a wave flag banner located near the entrance on [address]. Turn into this entrance and immediately turn left or right and park in 1 of the 10 designated Covid Infusion Parking spots. There is a phone number on the sign, please call and let the staff know what spot you are in and we will come out and get you. For questions call [phone].  Thanks.

## 2020-01-03 ENCOUNTER — Ambulatory Visit (HOSPITAL_COMMUNITY): Payer: Medicare HMO

## 2020-01-03 ENCOUNTER — Other Ambulatory Visit: Payer: Self-pay

## 2020-01-03 ENCOUNTER — Emergency Department (HOSPITAL_COMMUNITY): Payer: Medicare HMO

## 2020-01-03 ENCOUNTER — Emergency Department (HOSPITAL_COMMUNITY)
Admission: EM | Admit: 2020-01-03 | Discharge: 2020-01-03 | Disposition: A | Discharge status: Home / Self Care | Payer: Medicare HMO | Attending: Emergency Medicine | Admitting: Emergency Medicine

## 2020-01-03 DIAGNOSIS — I10 Essential (primary) hypertension: Secondary | ICD-10-CM | POA: Diagnosis not present

## 2020-01-03 DIAGNOSIS — U071 COVID-19: Secondary | ICD-10-CM | POA: Insufficient documentation

## 2020-01-03 DIAGNOSIS — K8020 Calculus of gallbladder without cholecystitis without obstruction: Secondary | ICD-10-CM

## 2020-01-03 DIAGNOSIS — Z87891 Personal history of nicotine dependence: Secondary | ICD-10-CM | POA: Insufficient documentation

## 2020-01-03 DIAGNOSIS — K5900 Constipation, unspecified: Secondary | ICD-10-CM | POA: Diagnosis present

## 2020-01-03 DIAGNOSIS — R451 Restlessness and agitation: Secondary | ICD-10-CM | POA: Insufficient documentation

## 2020-01-03 DIAGNOSIS — R1084 Generalized abdominal pain: Secondary | ICD-10-CM | POA: Diagnosis not present

## 2020-01-03 DIAGNOSIS — R1011 Right upper quadrant pain: Secondary | ICD-10-CM

## 2020-01-03 DIAGNOSIS — Z79899 Other long term (current) drug therapy: Secondary | ICD-10-CM | POA: Insufficient documentation

## 2020-01-03 LAB — CBC WITH DIFFERENTIAL/PLATELET
Abs Immature Granulocytes: 0.33 K/uL — ABNORMAL HIGH (ref 0.00–0.07)
Basophils Absolute: 0 K/uL (ref 0.0–0.1)
Basophils Relative: 0 %
Eosinophils Absolute: 0 K/uL (ref 0.0–0.5)
Eosinophils Relative: 0 %
HCT: 37.9 % (ref 36.0–46.0)
Hemoglobin: 12.1 g/dL (ref 12.0–15.0)
Immature Granulocytes: 2 %
Lymphocytes Relative: 6 %
Lymphs Abs: 1.3 K/uL (ref 0.7–4.0)
MCH: 26.1 pg (ref 26.0–34.0)
MCHC: 31.9 g/dL (ref 30.0–36.0)
MCV: 81.9 fL (ref 80.0–100.0)
Monocytes Absolute: 1.7 K/uL — ABNORMAL HIGH (ref 0.1–1.0)
Monocytes Relative: 8 %
Neutro Abs: 17.8 K/uL — ABNORMAL HIGH (ref 1.7–7.7)
Neutrophils Relative %: 84 %
Platelets: 442 K/uL — ABNORMAL HIGH (ref 150–400)
RBC: 4.63 MIL/uL (ref 3.87–5.11)
RDW: 17.2 % — ABNORMAL HIGH (ref 11.5–15.5)
WBC: 21.2 K/uL — ABNORMAL HIGH (ref 4.0–10.5)
nRBC: 0 % (ref 0.0–0.2)

## 2020-01-03 LAB — COMPREHENSIVE METABOLIC PANEL WITH GFR
ALT: 44 U/L (ref 0–44)
AST: 32 U/L (ref 15–41)
Albumin: 2.9 g/dL — ABNORMAL LOW (ref 3.5–5.0)
Alkaline Phosphatase: 71 U/L (ref 38–126)
Anion gap: 10 (ref 5–15)
BUN: 27 mg/dL — ABNORMAL HIGH (ref 8–23)
CO2: 24 mmol/L (ref 22–32)
Calcium: 8.7 mg/dL — ABNORMAL LOW (ref 8.9–10.3)
Chloride: 101 mmol/L (ref 98–111)
Creatinine, Ser: 0.74 mg/dL (ref 0.44–1.00)
GFR, Estimated: >60 mL/min
Glucose, Bld: 126 mg/dL — ABNORMAL HIGH (ref 70–99)
Potassium: 2.9 mmol/L — ABNORMAL LOW (ref 3.5–5.1)
Sodium: 135 mmol/L (ref 135–145)
Total Bilirubin: 0.7 mg/dL (ref 0.3–1.2)
Total Protein: 5.9 g/dL — ABNORMAL LOW (ref 6.5–8.1)

## 2020-01-03 LAB — LIPASE, BLOOD: Lipase: 32 U/L (ref 11–51)

## 2020-01-03 MED ORDER — BISACODYL 10 MG RE SUPP
10.0000 mg | Freq: Once | RECTAL | Status: AC
  Administered 2020-01-03: 10 mg via RECTAL

## 2020-01-03 MED ORDER — MORPHINE SULFATE (PF) 4 MG/ML IV SOLN
4.0000 mg | Freq: Once | INTRAVENOUS | Status: AC
  Administered 2020-01-03: 4 mg via INTRAVENOUS
  Filled 2020-01-03: qty 1

## 2020-01-03 MED ORDER — ATENOLOL 25 MG PO TABS
25.0000 mg | ORAL_TABLET | Freq: Once | ORAL | Status: AC
  Administered 2020-01-03: 25 mg via ORAL
  Filled 2020-01-03: qty 1

## 2020-01-03 MED ORDER — METOPROLOL TARTRATE 5 MG/5ML IV SOLN
5.0000 mg | Freq: Once | INTRAVENOUS | Status: AC
  Administered 2020-01-03: 5 mg via INTRAVENOUS
  Filled 2020-01-03: qty 5

## 2020-01-03 NOTE — ED Provider Notes (Signed)
[facility] EMERGENCY DEPARTMENT Provider Note   CSN: [id number] Arrival date & time: 01/03/20  0147   Time seen 5:32 AM  History Chief Complaint  Patient presents with  . Constipation    Paula Klein is a 74 y.o. female.  HPI   Patient was just discharged from the hospital yesterday after being admitted with Covid pneumonia.  He states she did an enema at home because she thought maybe she was constipated.  She states her last normal bowel movement was 2 days ago but it was soft.  She denies having a history of constipation or abdominal problems.  She denies any fever.  She states she took a enema at home without relief.  Past Medical History:  Diagnosis Date  . H/O blood clots   . Hypertension     Patient Active Problem List   Diagnosis Date Noted  . Essential hypertension 12/31/2019  . History of DVT of lower extremity 12/31/2019  . Pneumonia due to COVID-19 virus 12/31/2019  . Acute bronchitis 03/02/2018  . Varicose veins of both lower extremities with inflammation 08/31/2017  . Chronic venous insufficiency 08/31/2017  . Swelling of limb 08/31/2017    Past Surgical History:  Procedure Laterality Date  . COLONOSCOPY WITH PROPOFOL N/A 09/26/2015   Procedure: COLONOSCOPY WITH PROPOFOL;  Surgeon: Lollie Sails, MD;  Location: [facility] ENDOSCOPY;  Service: Endoscopy;  Laterality: N/A;  . NO PAST SURGERIES       OB History   No obstetric history on file.     Family History  Problem Relation Age of Onset  . Varicose Veins Mother   . Heart attack Father   . Cancer Brother     Social History   Tobacco Use  . Smoking status: Former Smoker    Packs/day: 3.00    Years: 30.00    Pack years: 90.00    Quit date: 02/03/1989    Years since quitting: 30.9  . Smokeless tobacco: Never Used  Vaping Use  . Vaping Use: Never used  Substance Use Topics  . Alcohol use: No  . Drug use: No    Home Medications Prior to Admission medications   Medication Sig Start  Date End Date Taking? Authorizing Provider  albuterol (PROVENTIL HFA;VENTOLIN HFA) 108 (90 Base) MCG/ACT inhaler Inhale 2 puffs into the lungs every 6 (six) hours as needed for shortness of breath.    [provider]  ALPRAZolam Duanne Moron) 0.5 MG tablet Take by mouth. 08/18/19   [provider]  Ascorbic Acid (VITAMIN C) 1000 MG tablet Take 1,000 mg by mouth daily.     [provider]  atenolol (TENORMIN) 25 MG tablet Take 25 mg by mouth daily.    [provider]  calcium carbonate (OS-CAL - DOSED IN MG OF ELEMENTAL CALCIUM) 1250 (500 Ca) MG tablet Take 1 tablet by mouth daily.    [provider]  Cholecalciferol (VITAMIN D3 PO) Take 500 Units by mouth daily.    [provider]  clonazePAM (KLONOPIN) 0.5 MG tablet Take 0.5 mg by mouth daily as needed for anxiety.    [provider]  estradiol (ESTRACE) 0.5 MG tablet Take 0.5 mg by mouth daily.    [provider]  fluticasone furoate-vilanterol (BREO ELLIPTA) 100-25 MCG/INH AEPB Inhale 1 puff into the lungs daily. 08/17/18   Wilhelmina Mcardle, MD  lisinopril-hydrochlorothiazide (ZESTORETIC) 20-12.5 MG tablet Take 1 tablet by mouth daily. 01/02/20   Barton Dubois, MD  Omega-3 Fatty Acids ([facility]  PO) Take 1,200 mg by mouth daily.     [provider]  ondansetron (ZOFRAN ODT) 4 MG disintegrating tablet Take 1 tablet (4 mg total) by mouth every 8 (eight) hours as needed for nausea. 12/26/19   Noemi Chapel, MD  pantoprazole (PROTONIX) 40 MG tablet Take 1 tablet (40 mg total) by mouth daily. 01/03/20   Barton Dubois, MD  polyethylene glycol powder [facility]) 17 GM/SCOOP powder Take by mouth.    [provider]  potassium chloride SA (KLOR-CON) 20 MEQ tablet Take 1 tablet (20 mEq total) by mouth daily for 5 days. 12/26/19 12/31/19  Noemi Chapel, MD  predniSONE (DELTASONE) 20 MG tablet Take 3 tablets by mouth daily x2 day; then take 2 tablet by mouth daily x2  days; then 1 tablet by mouth daily x3 days; then half tablet by mouth daily x3 days and stop prednisone. 01/02/20   Barton Dubois, MD  quiNINE Orlin Hilding) 324 MG capsule  03/23/19   [provider]  tiotropium (SPIRIVA) 18 MCG inhalation capsule Place 18 mcg into inhaler and inhale daily.    [provider]  vitamin E 1000 UNIT capsule Take 1,000 Units by mouth daily.    [provider]  XARELTO 10 MG TABS tablet Take 2 tablets (20 mg total) by mouth daily. 01/02/20   Barton Dubois, MD    Allergies    Pine tar, Sulfa antibiotics, and Tetracyclines & related  Review of Systems   Review of Systems  All other systems reviewed and are negative.   Physical Exam Updated Vital Signs BP (!) 152/92   Pulse (!) 115   Temp 98.8 F (37.1 C) (Oral)   Resp 20   SpO2 95%   Physical Exam Vitals and nursing note reviewed.  Constitutional:      General: She is not in acute distress.    Appearance: Normal appearance. She is obese.  HENT:     Head: Normocephalic and atraumatic.     Right Ear: External ear normal.     Left Ear: External ear normal.  Eyes:     Extraocular Movements: Extraocular movements intact.     Conjunctiva/sclera: Conjunctivae normal.     Pupils: Pupils are equal, round, and reactive to light.  Cardiovascular:     Rate and Rhythm: Normal rate and regular rhythm.     Pulses: Normal pulses.     Heart sounds: Normal heart sounds.  Pulmonary:     Effort: Pulmonary effort is normal. No respiratory distress.     Breath sounds: Normal breath sounds.  Abdominal:     Palpations: Abdomen is soft.     Tenderness: There is generalized abdominal tenderness. There is no guarding or rebound.  Musculoskeletal:        General: Normal range of motion.  Skin:    General: Skin is warm and dry.  Neurological:     General: No focal deficit present.     Mental Status: She is alert and oriented to person, place, and time.     Cranial Nerves: No cranial nerve  deficit.  Psychiatric:        Mood and Affect: Affect is labile.        Speech: Speech is rapid and pressured.        Behavior: Behavior is agitated.     ED Results / Procedures / Treatments   Labs (all labs ordered are listed, but only abnormal results are displayed)  labs pending  EKG None  Radiology CT Abdomen Pelvis  Wo Contrast  Result Date: 01/03/2020 CLINICAL DATA:  Acute nonlocalized abdominal pain. EXAM: CT ABDOMEN AND PELVIS WITHOUT CONTRAST TECHNIQUE: Multidetector CT imaging of the abdomen and pelvis was performed following the standard protocol without IV contrast. COMPARISON:  None. FINDINGS: Lower chest: Coronary atherosclerotic calcification. Multifocal ground-glass pneumonia at the lung bases in this patient with history of COVID. Trace pleural effusions. Hepatobiliary: Multifocal hepatic cystic densities.Cholelithiasis with multiple dependent calculi. No evidence of acute cholecystitis. Pancreas: Unremarkable. Spleen: Granulomatous type calcifications. Adrenals/Urinary Tract: Negative adrenals. No hydronephrosis or stone. Unremarkable bladder. Stomach/Bowel: No obstruction. No appendicitis. No visible bowel inflammation. Vascular/Lymphatic: No acute vascular abnormality. No mass or adenopathy. Reproductive:IUD. Other: No ascites or pneumoperitoneum. Musculoskeletal: No acute abnormalities. Multifocal disc and facet degeneration with scoliosis. IMPRESSION: 1. Patchy pneumonia at the lung bases correlating with history of COVID-19. 2. No acute intra-abdominal finding. 3. Cholelithiasis. Electronically Signed   By: Monte Fantasia M.D.   On: 01/03/2020 06:55    Procedures Procedures (including critical care time)  Medications Ordered in ED Medications  bisacodyl (DULCOLAX) suppository 10 mg (10 mg Rectal Given 01/03/20 0431)    ED Course  I have reviewed the triage vital signs and the nursing notes.  Pertinent labs & imaging results that were available during my care of  the patient were reviewed by me and considered in my medical decision making (see chart for details).    MDM Rules/Calculators/A&P                         Nursing staff have given patient a Dulcolax suppository before I could see her just to see if that would help.  She states she is having some diarrhea after that however she still having the abdominal discomfort.  Therefore CT scan was done.  Recheck at 7:30 AM patient is now lying calmly on the stretcher.  When asked where she hurts she now puts her hand in her epigastric area.  When I palpate her abdomen she has tenderness in the epigastric left upper quadrant and little bit in the right upper quadrant.  When I review her x-ray she does have a lot of stool along her right colon.  However her CT does show gallstones.  Laboratory testing was ordered to see if she has any suggestion of inflammation of her gallbladder, if so then we will proceed with ultrasound.  However due to her being recently positive for Covid will not do ultrasound as needed.  Dr Coralyn Helling took over patient at change of shift.   Final Clinical Impression(s) / ED Diagnoses Final diagnoses:  Generalized abdominal pain    Rx / DC Orders  Disposition pending  Rolland Porter, MD, Barbette Or, MD 01/03/20 [phone]

## 2020-01-03 NOTE — ED Triage Notes (Signed)
Pt c/o constipation. States she was d/c from hospital tonight but now back because she is having abdominal pain from being constipated. Tried an edema but no help.   unable to console .

## 2020-01-03 NOTE — ED Notes (Signed)
PO Fluid Challenge.  Pt sipping water and ice chips.

## 2020-01-03 NOTE — Discharge Instructions (Signed)
You were seen in the emergency department today with abdominal pain.  You do have some stones in your gallbladder which may be causing discomfort.  Please continue your COVID-19 treatments at home.  Once you have completed your home quarantine and have recovered from this you should see the general surgeon listed on this paperwork to discuss your symptoms and decide on treatment options.  Return to the emergency department any new or suddenly worsening symptoms.

## 2020-01-03 NOTE — ED Notes (Signed)
U/s at bedside

## 2020-01-03 NOTE — ED Notes (Signed)
Sats 88% on RA, placed on O2@2l /m via n/c.  Sats increased to 96%.

## 2020-01-03 NOTE — ED Provider Notes (Signed)
Blood pressure (!) 152/92, pulse (!) 115, temperature 98.8 F (37.1 C), temperature source Oral, resp. rate 20, SpO2 95 %.  Assuming care from Dr. Rolland Porter.  In short, Paula Klein is a 74 y.o. female with a chief complaint of Constipation .  Refer to the original H&P for additional details.  The current plan of care is to f/u on labs and reassess.   10:15 AM  On my reassessment the patient is having significant abdominal discomfort.  Her LFTs, bilirubin, lipase are unremarkable.  White count is up trending slightly from previous but has been on steroid.  EKG shows A. fib with slightly elevated rate at 127.  We will give IV metoprolol along with her a.m. dose of p.o. atenolol.  Given her pain I will obtain a right upper quadrant ultrasound.  CT imaging from earlier today reviewed. Morphine given for pain.    EKG Interpretation  Date/Time:  Monday January 03 2020 10:29:03 EDT Ventricular Rate:  127 PR Interval:    QRS Duration: 81 QT Interval:  339 QTC Calculation: 493 R Axis:   54 Text Interpretation: Atrial fibrillation Borderline low voltage, extremity leads Borderline repolarization abnormality Borderline prolonged QT interval No STEMI Confirmed by Nanda Quinton [phone]) on 01/03/2020 10:33:46 AM          Giovoni Bunch, Wonda Olds, MD 01/03/20 1035

## 2020-01-04 ENCOUNTER — Ambulatory Visit (HOSPITAL_COMMUNITY)
Admit: 2020-01-04 | Discharge: 2020-01-04 | Disposition: A | Discharge status: Home / Self Care | Payer: Medicare HMO | Source: Ambulatory Visit | Attending: Pulmonary Disease | Admitting: Pulmonary Disease

## 2020-01-04 DIAGNOSIS — J1282 Pneumonia due to coronavirus disease 2019: Secondary | ICD-10-CM | POA: Diagnosis not present

## 2020-01-04 DIAGNOSIS — U071 COVID-19: Secondary | ICD-10-CM | POA: Insufficient documentation

## 2020-01-04 MED ORDER — SODIUM CHLORIDE 0.9 % IV SOLN
100.0000 mg | Freq: Once | INTRAVENOUS | Status: AC
  Administered 2020-01-04: 100 mg via INTRAVENOUS
  Filled 2020-01-04: qty 20

## 2020-01-04 MED ORDER — ALBUTEROL SULFATE HFA 108 (90 BASE) MCG/ACT IN AERS: 2.0000 | INHALATION_SPRAY | Freq: Once | RESPIRATORY_TRACT | Status: DC | PRN

## 2020-01-04 MED ORDER — EPINEPHRINE 0.3 MG/0.3ML IJ SOAJ: 0.3000 mg | Freq: Once | INTRAMUSCULAR | Status: DC | PRN

## 2020-01-04 MED ORDER — SODIUM CHLORIDE 0.9 % IV SOLN: INTRAVENOUS | Status: DC | PRN

## 2020-01-04 MED ORDER — FAMOTIDINE IN NACL 20-0.9 MG/50ML-% IV SOLN: 20.0000 mg | Freq: Once | INTRAVENOUS | Status: DC | PRN

## 2020-01-04 MED ORDER — DIPHENHYDRAMINE HCL 50 MG/ML IJ SOLN: 50.0000 mg | Freq: Once | INTRAMUSCULAR | Status: DC | PRN

## 2020-01-04 MED ORDER — METHYLPREDNISOLONE SODIUM SUCC 125 MG IJ SOLR: 125.0000 mg | Freq: Once | INTRAMUSCULAR | Status: DC | PRN

## 2020-01-04 NOTE — Progress Notes (Signed)
  Diagnosis: COVID-19  Physician:dr wright  Procedure: Covid Infusion Clinic Med: remdesivir infusion - Provided patient with remdesivir fact sheet for patients, parents and caregivers prior to infusion.  Complications: No immediate complications noted.  Discharge: Discharged home   [place] 01/04/2020

## 2020-01-04 NOTE — Discharge Instructions (Signed)
COVID-19 COVID-19 is a respiratory infection that is caused by a virus called severe acute respiratory syndrome coronavirus 2 (SARS-CoV-2). The disease is also known as coronavirus disease or novel coronavirus. In some people, the virus may not cause any symptoms. In others, it may cause a serious infection. The infection can get worse quickly and can lead to complications, such as:  Pneumonia, or infection of the lungs.  Acute respiratory distress syndrome or ARDS. This is a condition in which fluid build-up in the lungs prevents the lungs from filling with air and passing oxygen into the blood.  Acute respiratory failure. This is a condition in which there is not enough oxygen passing from the lungs to the body or when carbon dioxide is not passing from the lungs out of the body.  Sepsis or septic shock. This is a serious bodily reaction to an infection.  Blood clotting problems.  Secondary infections due to bacteria or fungus.  Organ failure. This is when your body's organs stop working. The virus that causes COVID-19 is contagious. This means that it can spread from person to person through droplets from coughs and sneezes (respiratory secretions). What are the causes? This illness is caused by a virus. You may catch the virus by:  Breathing in droplets from an infected person. Droplets can be spread by a person breathing, speaking, singing, coughing, or sneezing.  Touching something, like a table or a doorknob, that was exposed to the virus (contaminated) and then touching your mouth, nose, or eyes. What increases the risk? Risk for infection You are more likely to be infected with this virus if you:  Are within 6 feet (2 meters) of a person with COVID-19.  Provide care for or live with a person who is infected with COVID-19.  Spend time in crowded indoor spaces or live in shared housing. Risk for serious illness You are more likely to become seriously ill from the virus if you:   Are 50 years of age or older. The higher your age, the more you are at risk for serious illness.  Live in a nursing home or long-term care facility.  Have cancer.  Have a long-term (chronic) disease such as: ? Chronic lung disease, including chronic obstructive pulmonary disease or asthma. ? A long-term disease that lowers your body's ability to fight infection (immunocompromised). ? Heart disease, including heart failure, a condition in which the arteries that lead to the heart become narrow or blocked (coronary artery disease), a disease which makes the heart muscle thick, weak, or stiff (cardiomyopathy). ? Diabetes. ? Chronic kidney disease. ? Sickle cell disease, a condition in which red blood cells have an abnormal "sickle" shape. ? Liver disease.  Are obese. What are the signs or symptoms? Symptoms of this condition can range from mild to severe. Symptoms may appear any time from 2 to 14 days after being exposed to the virus. They include:  A fever or chills.  A cough.  Difficulty breathing.  Headaches, body aches, or muscle aches.  Runny or stuffy (congested) nose.  A sore throat.  New loss of taste or smell. Some people may also have stomach problems, such as nausea, vomiting, or diarrhea. Other people may not have any symptoms of COVID-19. How is this diagnosed? This condition may be diagnosed based on:  Your signs and symptoms, especially if: ? You live in an area with a COVID-19 outbreak. ? You recently traveled to or from an area where the virus is common. ? You   provide care for or live with a person who was diagnosed with COVID-19. ? You were exposed to a person who was diagnosed with COVID-19.  A physical exam.  Lab tests, which may include: ? Taking a sample of fluid from the back of your nose and throat (nasopharyngeal fluid), your nose, or your throat using a swab. ? A sample of mucus from your lungs (sputum). ? Blood tests.  Imaging tests, which  may include, X-rays, CT scan, or ultrasound. How is this treated? At present, there is no medicine to treat COVID-19. Medicines that treat other diseases are being used on a trial basis to see if they are effective against COVID-19. Your health care provider will talk with you about ways to treat your symptoms. For most people, the infection is mild and can be managed at home with rest, fluids, and over-the-counter medicines. Treatment for a serious infection usually takes places in a hospital intensive care unit (ICU). It may include one or more of the following treatments. These treatments are given until your symptoms improve.  Receiving fluids and medicines through an IV.  Supplemental oxygen. Extra oxygen is given through a tube in the nose, a face mask, or a hood.  Positioning you to lie on your stomach (prone position). This makes it easier for oxygen to get into the lungs.  Continuous positive airway pressure (CPAP) or bi-level positive airway pressure (BPAP) machine. This treatment uses mild air pressure to keep the airways open. A tube that is connected to a motor delivers oxygen to the body.  Ventilator. This treatment moves air into and out of the lungs by using a tube that is placed in your windpipe.  Tracheostomy. This is a procedure to create a hole in the neck so that a breathing tube can be inserted.  Extracorporeal membrane oxygenation (ECMO). This procedure gives the lungs a chance to recover by taking over the functions of the heart and lungs. It supplies oxygen to the body and removes carbon dioxide. Follow these instructions at home: Lifestyle  If you are sick, stay home except to get medical care. Your health care provider will tell you how long to stay home. Call your health care provider before you go for medical care.  Rest at home as told by your health care provider.  Do not use any products that contain nicotine or tobacco, such as cigarettes, e-cigarettes, and  chewing tobacco. If you need help quitting, ask your health care provider.  Return to your normal activities as told by your health care provider. Ask your health care provider what activities are safe for you. General instructions  Take over-the-counter and prescription medicines only as told by your health care provider.  Drink enough fluid to keep your urine pale yellow.  Keep all follow-up visits as told by your health care provider. This is important. How is this prevented?  There is no vaccine to help prevent COVID-19 infection. However, there are steps you can take to protect yourself and others from this virus. To protect yourself:   Do not travel to areas where COVID-19 is a risk. The areas where COVID-19 is reported change often. To identify high-risk areas and travel restrictions, check the CDC travel website: wwwnc.cdc.gov/travel/notices  If you live in, or must travel to, an area where COVID-19 is a risk, take precautions to avoid infection. ? Stay away from people who are sick. ? Wash your hands often with soap and water for 20 seconds. If soap and water   are not available, use an alcohol-based hand sanitizer. ? Avoid touching your mouth, face, eyes, or nose. ? Avoid going out in public, follow guidance from your state and local health authorities. ? If you must go out in public, wear a cloth face covering or face mask. Make sure your mask covers your nose and mouth. ? Avoid crowded indoor spaces. Stay at least 6 feet (2 meters) away from others. ? Disinfect objects and surfaces that are frequently touched every day. This may include:  Counters and tables.  Doorknobs and light switches.  Sinks and faucets.  Electronics, such as phones, remote controls, keyboards, computers, and tablets. To protect others: If you have symptoms of COVID-19, take steps to prevent the virus from spreading to others.  If you think you have a COVID-19 infection, contact your health care  provider right away. Tell your health care team that you think you may have a COVID-19 infection.  Stay home. Leave your house only to seek medical care. Do not use public transport.  Do not travel while you are sick.  Wash your hands often with soap and water for 20 seconds. If soap and water are not available, use alcohol-based hand sanitizer.  Stay away from other members of your household. Let healthy household members care for children and pets, if possible. If you have to care for children or pets, wash your hands often and wear a mask. If possible, stay in your own room, separate from others. Use a different bathroom.  Make sure that all people in your household wash their hands well and often.  Cough or sneeze into a tissue or your sleeve or elbow. Do not cough or sneeze into your hand or into the air.  Wear a cloth face covering or face mask. Make sure your mask covers your nose and mouth. Where to find more information  Centers for Disease Control and Prevention: www.cdc.gov/coronavirus/2019-ncov/index.html  World Health Organization: www.who.int/health-topics/coronavirus Contact a health care provider if:  You live in or have traveled to an area where COVID-19 is a risk and you have symptoms of the infection.  You have had contact with someone who has COVID-19 and you have symptoms of the infection. Get help right away if:  You have trouble breathing.  You have pain or pressure in your chest.  You have confusion.  You have bluish lips and fingernails.  You have difficulty waking from sleep.  You have symptoms that get worse. These symptoms may represent a serious problem that is an emergency. Do not wait to see if the symptoms will go away. Get medical help right away. Call your local emergency services (911 in the [place] Do not drive yourself to the hospital. Let the emergency medical personnel know if you think you have COVID-19. Summary  COVID-19 is a  respiratory infection that is caused by a virus. It is also known as coronavirus disease or novel coronavirus. It can cause serious infections, such as pneumonia, acute respiratory distress syndrome, acute respiratory failure, or sepsis.  The virus that causes COVID-19 is contagious. This means that it can spread from person to person through droplets from breathing, speaking, singing, coughing, or sneezing.  You are more likely to develop a serious illness if you are 50 years of age or older, have a weak immune system, live in a nursing home, or have chronic disease.  There is no medicine to treat COVID-19. Your health care provider will talk with you about ways to treat your symptoms.    Take steps to protect yourself and others from infection. Wash your hands often and disinfect objects and surfaces that are frequently touched every day. Stay away from people who are sick and wear a mask if you are sick. This information is not intended to replace advice given to you by your health care provider. Make sure you discuss any questions you have with your health care provider. Document Revised: 12/18/2018 Document Reviewed: 03/26/2018 Elsevier Patient Education  2020 [employer]  

## 2020-01-05 ENCOUNTER — Ambulatory Visit (HOSPITAL_COMMUNITY)
Admission: RE | Admit: 2020-01-05 | Discharge: 2020-01-05 | Disposition: A | Discharge status: Home / Self Care | Payer: Medicare HMO | Source: Ambulatory Visit | Attending: Pulmonary Disease | Admitting: Pulmonary Disease

## 2020-01-05 DIAGNOSIS — U071 COVID-19: Secondary | ICD-10-CM | POA: Diagnosis not present

## 2020-01-05 MED ORDER — METHYLPREDNISOLONE SODIUM SUCC 125 MG IJ SOLR: 125.0000 mg | Freq: Once | INTRAMUSCULAR | Status: DC | PRN

## 2020-01-05 MED ORDER — DIPHENHYDRAMINE HCL 50 MG/ML IJ SOLN: 50.0000 mg | Freq: Once | INTRAMUSCULAR | Status: DC | PRN

## 2020-01-05 MED ORDER — FAMOTIDINE IN NACL 20-0.9 MG/50ML-% IV SOLN: 20.0000 mg | Freq: Once | INTRAVENOUS | Status: DC | PRN

## 2020-01-05 MED ORDER — SODIUM CHLORIDE 0.9 % IV SOLN
100.0000 mg | Freq: Once | INTRAVENOUS | Status: AC
  Administered 2020-01-05: 100 mg via INTRAVENOUS
  Filled 2020-01-05: qty 20

## 2020-01-05 MED ORDER — ALBUTEROL SULFATE HFA 108 (90 BASE) MCG/ACT IN AERS: 2.0000 | INHALATION_SPRAY | Freq: Once | RESPIRATORY_TRACT | Status: DC | PRN

## 2020-01-05 MED ORDER — EPINEPHRINE 0.3 MG/0.3ML IJ SOAJ: 0.3000 mg | Freq: Once | INTRAMUSCULAR | Status: DC | PRN

## 2020-01-05 MED ORDER — SODIUM CHLORIDE 0.9 % IV SOLN: INTRAVENOUS | Status: DC | PRN

## 2020-01-05 NOTE — Discharge Instructions (Signed)
10 Things You Can Do to Manage Your COVID-19 Symptoms at Home If you have possible or confirmed COVID-19: 1. Stay home from work and school. And stay away from other public places. If you must go out, avoid using any kind of public transportation, ridesharing, or taxis. 2. Monitor your symptoms carefully. If your symptoms get worse, call your healthcare provider immediately. 3. Get rest and stay hydrated. 4. If you have a medical appointment, call the healthcare provider ahead of time and tell them that you have or may have COVID-19. 5. For medical emergencies, call 911 and notify the dispatch personnel that you have or may have COVID-19. 6. Cover your cough and sneezes with a tissue or use the inside of your elbow. 7. Wash your hands often with soap and water for at least 20 seconds or clean your hands with an alcohol-based hand sanitizer that contains at least 60% alcohol. 8. As much as possible, stay in a specific room and away from other people in your home. Also, you should use a separate bathroom, if available. If you need to be around other people in or outside of the home, wear a mask. 9. Avoid sharing personal items with other people in your household, like dishes, towels, and bedding. 10. Clean all surfaces that are touched often, like counters, tabletops, and doorknobs. Use household cleaning sprays or wipes according to the label instructions. cdc.gov/coronavirus 09/02/2018 This information is not intended to replace advice given to you by your health care provider. Make sure you discuss any questions you have with your health care provider. Document Revised: 02/04/2019 Document Reviewed: 02/04/2019 Elsevier Patient Education  2020 Elsevier Inc.  

## 2020-01-05 NOTE — Progress Notes (Signed)
  Diagnosis: COVID-19  Physician: Dr. Joya Gaskins  Procedure: [facility] Med: remdesivir infusion - Provided patient with remdesivir fact sheet for patients, parents and caregivers prior to infusion.  Complications: No immediate complications noted.  Discharge: Discharged home   Paula Klein 01/05/2020

## 2020-01-11 ENCOUNTER — Encounter: Payer: Self-pay | Admitting: General Surgery

## 2020-01-11 ENCOUNTER — Other Ambulatory Visit: Payer: Self-pay

## 2020-01-11 ENCOUNTER — Ambulatory Visit: Payer: Medicare HMO | Admitting: General Surgery

## 2020-01-11 VITALS — BP 137/86 | HR 108 | Temp 97.9°F | Resp 18 | Ht 62.0 in | Wt 158.0 lb

## 2020-01-11 DIAGNOSIS — K8020 Calculus of gallbladder without cholecystitis without obstruction: Secondary | ICD-10-CM

## 2020-01-11 NOTE — Patient Instructions (Signed)
Biliary Colic, Adult  Biliary colic is severe pain caused by a problem with a small organ in the upper right part of your belly (gallbladder). The gallbladder stores a digestive fluid produced in the liver (bile) that helps the body break down fat. Bile and other digestive enzymes are carried from the liver to the small intestine through tube-like structures (bile ducts). The gallbladder and the bile ducts form the biliary tract. Sometimes hard deposits of digestive fluids form in the gallbladder (gallstones) and block the flow of bile from the gallbladder, causing biliary colic. This condition is also called a gallbladder attack. Gallstones can be as small as a grain of sand or as big as a golf ball. There could be just one gallstone in the gallbladder, or there could be many. What are the causes? Biliary colic is usually caused by gallstones. Less often, a tumor could block the flow of bile from the gallbladder and trigger biliary colic. What increases the risk? This condition is more likely to develop in:  Women.  People of Hispanic descent.  People with a family history of gallstones.  People who are obese.  People who suddenly or quickly lose weight.  People who eat a high-calorie, low-fiber diet that is rich in refined carbs (carbohydrates), such as white bread and white rice.  People who have an intestinal disease that affects nutrient absorption, such as Crohn disease.  People who have a metabolic condition, such as metabolic syndrome or diabetes. What are the signs or symptoms? Severe pain in the upper right side of the belly is the main symptom of biliary colic. You may feel this pain below the chest but above the hip. This pain often occurs at night or after eating a very fatty meal. This pain may get worse for up to an hour and last as long as 12 hours. In most cases, the pain fades (subsides) within a couple hours. Other symptoms of this condition include:  Nausea and  vomiting.  Pain under the right shoulder. How is this diagnosed? This condition is diagnosed based on your medical history, your symptoms, and a physical exam. You may have tests, including:  Blood tests to rule out infection or inflammation of the bile ducts, gallbladder, pancreas, or liver.  Imaging studies such as: ? Ultrasound. ? CT scan. ? MRI. In some cases, you may need to have an imaging study done using a small amount of radioactive material (nuclear medicine) to confirm the diagnosis. How is this treated? Treatment for this condition may include medicine to relieve your pain or nausea. If you have gallstones that are causing biliary colic, you may need surgery to remove the gallbladder (cholecystectomy). Gallstones can also be dissolved gradually with medicine. It may take months or years before the gallstones are completely gone. Follow these instructions at home:  Take over-the-counter and prescription medicines only as told by your health care provider.  Drink enough fluid to keep your urine clear or pale yellow.  Follow instructions from your health care provider about eating or drinking restrictions. These may include avoiding: ? Fatty, greasy, and fried foods. ? Any foods that make the pain worse. ? Overeating. ? Having a large meal after not eating for a while.  Keep all follow-up visits as told by your health care provider. This is important. How is this prevented? Steps to prevent this condition include:  Maintaining a healthy body weight.  Getting regular exercise.  Eating a healthy, high-fiber, low-fat diet.  Limiting how much   sugar and refined carbs you eat, such as sweets, white flour, and white rice. Contact a health care provider if:  Your pain lasts more than 5 hours.  You vomit.  You have a fever and chills.  Your pain gets worse. Get help right away if:  Your skin or the whites of your eyes look yellow (jaundice).  Your have tea-colored  urine and light-colored stools.  You are dizzy or you faint. Summary  Biliary colic is severe pain caused by a problem with a small organ in the upper right part of your belly (gallbladder).  Treatments for this condition include medicines that relieves your pain or nausea and medicines that slowly dissolves the gallstones.  If gallstones cause your biliary colic, the treatment is surgery to remove the gallbladder (cholecystectomy). This information is not intended to replace advice given to you by your health care provider. Make sure you discuss any questions you have with your health care provider. Document Revised: 01/31/2017 Document Reviewed: 09/04/2015 Elsevier Patient Education  2020 Elsevier Inc.  

## 2020-01-12 NOTE — Progress Notes (Signed)
Paula Klein; [id number]; 1945-09-24   HPI Patient is a 74 year old white female who was referred to my care by Dr. Lennox Grumbles in the emergency room for evaluation treatment of abdominal pain and cholelithiasis.  Patient had been recently discharged from the hospital after being admitted for Covid pneumonia.  She presented back to the emergency room and was found to have leukocytosis with normal LFTs.  The leukocytosis was probably secondary to her steroid use from her recent treatment for Covid pneumonia.  Ultrasound the gallbladder revealed cholelithiasis but a normal common bile duct and normal gallbladder wall.  There was no evidence of cholecystitis.  In my office, she currently complains of left upper quadrant abdominal pain.  She has had multiple family members that had her gallbladder out.  Patient is on chronic anticoagulation for history of DVTs.  She is eating and denies any fatty food intolerance.  Her left upper quadrant abdominal pain is made worse with movement.  She does not have any right upper quadrant abdominal pain in the office. Past Medical History:  Diagnosis Date   H/O blood clots    Hypertension     Past Surgical History:  Procedure Laterality Date   COLONOSCOPY WITH PROPOFOL N/A 09/26/2015   Procedure: COLONOSCOPY WITH PROPOFOL;  Surgeon: Lollie Sails, MD;  Location: [facility] ENDOSCOPY;  Service: Endoscopy;  Laterality: N/A;   NO PAST SURGERIES      Family History  Problem Relation Age of Onset   Varicose Veins Mother    Heart attack Father    Cancer Brother     Current Outpatient Medications on File Prior to Visit  Medication Sig Dispense Refill   albuterol (PROVENTIL HFA;VENTOLIN HFA) 108 (90 Base) MCG/ACT inhaler Inhale 2 puffs into the lungs every 6 (six) hours as needed for shortness of breath.     ALPRAZolam (XANAX) 0.5 MG tablet Take by mouth.     Ascorbic Acid (VITAMIN C) 1000 MG tablet Take 1,000 mg by mouth daily.      aspirin EC 81 MG tablet  Take 81 mg by mouth daily. Swallow whole.     atenolol (TENORMIN) 25 MG tablet Take 25 mg by mouth daily.     calcium carbonate (OS-CAL - DOSED IN MG OF ELEMENTAL CALCIUM) 1250 (500 Ca) MG tablet Take 1 tablet by mouth daily.     Cholecalciferol (VITAMIN D3 PO) Take 500 Units by mouth daily.     clonazePAM (KLONOPIN) 0.5 MG tablet Take 0.5 mg by mouth daily as needed for anxiety.     estradiol (ESTRACE) 0.5 MG tablet Take 0.5 mg by mouth daily.     fluticasone furoate-vilanterol (BREO ELLIPTA) 100-25 MCG/INH AEPB Inhale 1 puff into the lungs daily. 60 each 5   lisinopril-hydrochlorothiazide (ZESTORETIC) 20-12.5 MG tablet Take 1 tablet by mouth daily.     Omega-3 Fatty Acids (FISH OIL PO) Take 1,200 mg by mouth daily.      ondansetron (ZOFRAN ODT) 4 MG disintegrating tablet Take 1 tablet (4 mg total) by mouth every 8 (eight) hours as needed for nausea. 10 tablet 0   predniSONE (DELTASONE) 20 MG tablet Take 3 tablets by mouth daily x2 day; then take 2 tablet by mouth daily x2 days; then 1 tablet by mouth daily x3 days; then half tablet by mouth daily x3 days and stop prednisone. 20 tablet 0   quiNINE (QUALAQUIN) 324 MG capsule      vitamin E 1000 UNIT capsule Take 1,000 Units by mouth daily.  XARELTO 10 MG TABS tablet Take 2 tablets (20 mg total) by mouth daily.     potassium chloride SA (KLOR-CON) 20 MEQ tablet Take 1 tablet (20 mEq total) by mouth daily for 5 days. 5 tablet 0   No current facility-administered medications on file prior to visit.    Allergies  Allergen Reactions   Pine Tar Cough   Sulfa Antibiotics Rash   Tetracyclines & Related Rash    Social History   Substance and Sexual Activity  Alcohol Use No    Social History   Tobacco Use  Smoking Status Former Smoker   Packs/day: 3.00   Years: 30.00   Pack years: 90.00   Quit date: 02/03/1989   Years since quitting: 30.9  Smokeless Tobacco Never Used    Review of Systems  Constitutional:  Negative.   HENT: Negative.   Eyes: Negative.   Respiratory: Negative.   Cardiovascular: Negative.   Gastrointestinal: Positive for abdominal pain and heartburn.  Genitourinary: Negative.   Musculoskeletal: Positive for back pain.  Skin: Negative.   Neurological: Positive for sensory change.  Endo/Heme/Allergies: Bruises/bleeds easily.  Psychiatric/Behavioral: Negative.     Objective   Vitals:   01/11/20 1340  BP: 137/86  Pulse: (!) 108  Resp: 18  Temp: 97.9 F (36.6 C)  SpO2: 90%    Physical Exam Vitals reviewed.  Constitutional:      Appearance: Normal appearance. She is normal weight. She is not ill-appearing.  HENT:     Head: Normocephalic and atraumatic.  Eyes:     General: No scleral icterus. Cardiovascular:     Rate and Rhythm: Regular rhythm. Tachycardia present.     Heart sounds: Normal heart sounds. No murmur heard.  No friction rub. No gallop.   Pulmonary:     Effort: Pulmonary effort is normal. No respiratory distress.     Breath sounds: Normal breath sounds. No stridor. No wheezing, rhonchi or rales.  Abdominal:     General: Abdomen is flat. There is no distension.     Palpations: Abdomen is soft. There is no mass.     Tenderness: There is no abdominal tenderness. There is no guarding or rebound.     Hernia: No hernia is present.  Skin:    General: Skin is warm and dry.  Neurological:     Mental Status: She is alert and oriented to person, place, and time.   ER notes and ultrasound report reviewed  Assessment  Cholelithiasis, somewhat atypical given her current symptoms of left upper quadrant abdominal pain which are made worse with movement.  I suspect she has muscular strain secondary to her recent Covid diagnosis as well as bronchitis and underlying COPD. Plan   The current recommendation is to only perform elective surgery 4 to 6 weeks after the initial diagnosis of Covid.  She was Covid +14 days ago.  That being said, she is at higher risk for  surgery given the need for stopping her anticoagulation and having her breathing maximized.  She understands this.  I told her that should she continue to be symptomatic, then we can consider laparoscopic cholecystectomy.  She will return to my office in 3 weeks for follow-up.  Literature was given.

## 2020-01-17 ENCOUNTER — Telehealth (INDEPENDENT_AMBULATORY_CARE_PROVIDER_SITE_OTHER): Payer: Self-pay

## 2020-01-17 NOTE — Telephone Encounter (Signed)
The pt called and left a VM saying that she doesn't want to have her laser procedure until the 1st of the year due to having covid previously  I called the pt and made her aware that I made the scheduler aware of her preference .

## 2020-02-01 ENCOUNTER — Encounter: Payer: Self-pay | Admitting: General Surgery

## 2020-02-01 ENCOUNTER — Ambulatory Visit: Payer: Medicare HMO | Admitting: General Surgery

## 2020-02-01 ENCOUNTER — Other Ambulatory Visit: Payer: Self-pay

## 2020-02-01 VITALS — BP 122/84 | HR 114 | Temp 97.6°F | Resp 18 | Ht 62.0 in | Wt 154.0 lb

## 2020-02-01 DIAGNOSIS — K8020 Calculus of gallbladder without cholecystitis without obstruction: Secondary | ICD-10-CM

## 2020-02-01 NOTE — Progress Notes (Signed)
Subjective:     Paula Klein  Patient here for follow-up of cholelithiasis.  Her primary complaint is that of shortness of breath after walking a short distance.  She also complains of reflux and chest pressure when she gets short of breath.  She thinks it is related to her recent Covid diagnosis.  She denies any right upper quadrant abdominal pain or nausea. Objective:    BP 122/84   Pulse (!) 114   Temp 97.6 F (36.4 C) (Oral)   Resp 18   Ht 5\' 2"  (1.575 m)   Wt 154 lb (69.9 kg)   SpO2 90%   BMI 28.17 kg/m   General:  alert, cooperative and no distress  Abdomen soft, nontender, nondistended.  No right upper quadrant abdominal pain noted.     Assessment:    Cholelithiasis, currently asymptomatic   Patient suffering from residual respiratory difficulties from Covid infection.  That is being addressed by her primary care physician.   Plan:   I told the patient that I would not recommend cholecystectomy at this time.  Patient understands and agrees.  She will follow up with me as needed.  She was instructed to avoid any trigger foods.  Literature was given.

## 2020-02-01 NOTE — Patient Instructions (Signed)
Biliary Colic, Adult  Biliary colic is severe pain caused by a problem with a small organ in the upper right part of your belly (gallbladder). The gallbladder stores a digestive fluid produced in the liver (bile) that helps the body break down fat. Bile and other digestive enzymes are carried from the liver to the small intestine through tube-like structures (bile ducts). The gallbladder and the bile ducts form the biliary tract. Sometimes hard deposits of digestive fluids form in the gallbladder (gallstones) and block the flow of bile from the gallbladder, causing biliary colic. This condition is also called a gallbladder attack. Gallstones can be as small as a grain of sand or as big as a golf ball. There could be just one gallstone in the gallbladder, or there could be many. What are the causes? Biliary colic is usually caused by gallstones. Less often, a tumor could block the flow of bile from the gallbladder and trigger biliary colic. What increases the risk? This condition is more likely to develop in:  Women.  People of Hispanic descent.  People with a family history of gallstones.  People who are obese.  People who suddenly or quickly lose weight.  People who eat a high-calorie, low-fiber diet that is rich in refined carbs (carbohydrates), such as white bread and white rice.  People who have an intestinal disease that affects nutrient absorption, such as Crohn disease.  People who have a metabolic condition, such as metabolic syndrome or diabetes. What are the signs or symptoms? Severe pain in the upper right side of the belly is the main symptom of biliary colic. You may feel this pain below the chest but above the hip. This pain often occurs at night or after eating a very fatty meal. This pain may get worse for up to an hour and last as long as 12 hours. In most cases, the pain fades (subsides) within a couple hours. Other symptoms of this condition include:  Nausea and  vomiting.  Pain under the right shoulder. How is this diagnosed? This condition is diagnosed based on your medical history, your symptoms, and a physical exam. You may have tests, including:  Blood tests to rule out infection or inflammation of the bile ducts, gallbladder, pancreas, or liver.  Imaging studies such as: ? Ultrasound. ? CT scan. ? MRI. In some cases, you may need to have an imaging study done using a small amount of radioactive material (nuclear medicine) to confirm the diagnosis. How is this treated? Treatment for this condition may include medicine to relieve your pain or nausea. If you have gallstones that are causing biliary colic, you may need surgery to remove the gallbladder (cholecystectomy). Gallstones can also be dissolved gradually with medicine. It may take months or years before the gallstones are completely gone. Follow these instructions at home:  Take over-the-counter and prescription medicines only as told by your health care provider.  Drink enough fluid to keep your urine clear or pale yellow.  Follow instructions from your health care provider about eating or drinking restrictions. These may include avoiding: ? Fatty, greasy, and fried foods. ? Any foods that make the pain worse. ? Overeating. ? Having a large meal after not eating for a while.  Keep all follow-up visits as told by your health care provider. This is important. How is this prevented? Steps to prevent this condition include:  Maintaining a healthy body weight.  Getting regular exercise.  Eating a healthy, high-fiber, low-fat diet.  Limiting how much   sugar and refined carbs you eat, such as sweets, white flour, and white rice. Contact a health care provider if:  Your pain lasts more than 5 hours.  You vomit.  You have a fever and chills.  Your pain gets worse. Get help right away if:  Your skin or the whites of your eyes look yellow (jaundice).  Your have tea-colored  urine and light-colored stools.  You are dizzy or you faint. Summary  Biliary colic is severe pain caused by a problem with a small organ in the upper right part of your belly (gallbladder).  Treatments for this condition include medicines that relieves your pain or nausea and medicines that slowly dissolves the gallstones.  If gallstones cause your biliary colic, the treatment is surgery to remove the gallbladder (cholecystectomy). This information is not intended to replace advice given to you by your health care provider. Make sure you discuss any questions you have with your health care provider. Document Revised: 01/31/2017 Document Reviewed: 09/04/2015 Elsevier Patient Education  2020 Elsevier Inc.  

## 2020-02-22 ENCOUNTER — Ambulatory Visit (INDEPENDENT_AMBULATORY_CARE_PROVIDER_SITE_OTHER): Payer: Medicare HMO

## 2020-02-22 ENCOUNTER — Other Ambulatory Visit: Payer: Self-pay

## 2020-02-22 ENCOUNTER — Ambulatory Visit: Payer: Medicare HMO | Admitting: Cardiology

## 2020-02-22 ENCOUNTER — Encounter: Payer: Self-pay | Admitting: Cardiology

## 2020-02-22 VITALS — BP 106/62 | HR 139 | Ht 61.0 in | Wt 158.0 lb

## 2020-02-22 DIAGNOSIS — I4891 Unspecified atrial fibrillation: Secondary | ICD-10-CM | POA: Diagnosis not present

## 2020-02-22 DIAGNOSIS — R0609 Other forms of dyspnea: Secondary | ICD-10-CM

## 2020-02-22 DIAGNOSIS — R600 Localized edema: Secondary | ICD-10-CM | POA: Diagnosis not present

## 2020-02-22 DIAGNOSIS — R0600 Dyspnea, unspecified: Secondary | ICD-10-CM | POA: Diagnosis not present

## 2020-02-22 DIAGNOSIS — I10 Essential (primary) hypertension: Secondary | ICD-10-CM

## 2020-02-22 MED ORDER — TORSEMIDE 20 MG PO TABS: 20.0000 mg | ORAL_TABLET | Freq: Once | ORAL | 3 refills | Status: DC

## 2020-02-22 MED ORDER — METOPROLOL TARTRATE 25 MG PO TABS: 25.0000 mg | ORAL_TABLET | Freq: Two times a day (BID) | ORAL | 3 refills | Status: DC

## 2020-02-22 NOTE — Progress Notes (Signed)
Cardiology Office Note:    Date:  02/22/2020   ID:  Paula Klein, DOB 1945/10/06, MRN [id number]  PCP:  Marguerita Merles, MD  [facility] Cardiologist:  Kate Sable, MD  [place] Electrophysiologist:  None   Referring MD: Marguerita Merles, MD   Chief Complaint  Patient presents with  . Other    Chest pain/sob. Meds reviewed verbally with pt.    History of Present Illness:    Paula Klein is a 74 y.o. female with a hx of hypertension, DVT on Xarelto, COPD, former smoker who presents due to chest pain and shortness of breath.  Patient states having worsening shortness of breath since contracting COVID-21 December 2019.  She can barely walk across her home without stopping due to shortness of breath.  Also endorses lower extremity edema.  Is being followed by the vein clinic, for venous insufficiency.  States having chest tightness with exertion.  Has a family history of heart attack in her father, age 17.  She states having a history of irregular heartbeat.  Past Medical History:  Diagnosis Date  . H/O blood clots   . Heart murmur   . Hypertension     Past Surgical History:  Procedure Laterality Date  . COLONOSCOPY WITH PROPOFOL N/A 09/26/2015   Procedure: COLONOSCOPY WITH PROPOFOL;  Surgeon: Lollie Sails, MD;  Location: [facility] ENDOSCOPY;  Service: Endoscopy;  Laterality: N/A;  . NO PAST SURGERIES      Current Medications: Current Meds  Medication Sig  . albuterol (PROVENTIL HFA;VENTOLIN HFA) 108 (90 Base) MCG/ACT inhaler Inhale 2 puffs into the lungs every 6 (six) hours as needed for shortness of breath.  . Ascorbic Acid (VITAMIN C) 1000 MG tablet Take 1,000 mg by mouth daily.   Marland Kitchen aspirin EC 81 MG tablet Take 81 mg by mouth daily. Swallow whole.  . calcium carbonate (OS-CAL - DOSED IN MG OF ELEMENTAL CALCIUM) 1250 (500 Ca) MG tablet Take 1 tablet by mouth daily.  . Cholecalciferol (VITAMIN D3 PO) Take 500 Units by mouth daily.  . clonazePAM (KLONOPIN) 0.5 MG  tablet Take 0.5 mg by mouth daily as needed for anxiety.  Marland Kitchen estradiol (ESTRACE) 0.5 MG tablet Take 0.5 mg by mouth daily.  . fluticasone furoate-vilanterol (BREO ELLIPTA) 100-25 MCG/INH AEPB Inhale 1 puff into the lungs daily.  . Omega-3 Fatty Acids (FISH OIL PO) Take 1,200 mg by mouth daily.   . ondansetron (ZOFRAN ODT) 4 MG disintegrating tablet Take 1 tablet (4 mg total) by mouth every 8 (eight) hours as needed for nausea.  . potassium chloride SA (KLOR-CON) 20 MEQ tablet Take 1 tablet (20 mEq total) by mouth daily for 5 days.  . quiNINE (QUALAQUIN) 324 MG capsule Take 324 mg by mouth daily at 8 pm.  . vitamin E 1000 UNIT capsule Take 1,000 Units by mouth daily.  Alveda Reasons 10 MG TABS tablet Take 2 tablets (20 mg total) by mouth daily. (Patient taking differently: Take 10 mg by mouth daily.)  . [DISCONTINUED] atenolol (TENORMIN) 25 MG tablet Take 25 mg by mouth daily.  . [DISCONTINUED] lisinopril-hydrochlorothiazide (ZESTORETIC) 20-12.5 MG tablet Take 1 tablet by mouth daily.     Allergies:   Pine tar, Sulfa antibiotics, and Tetracyclines & related   Social History   Socioeconomic History  . Marital status: Widowed    Spouse name: Not on file  . Number of children: Not on file  . Years of education: Not on file  . Highest education  level: Not on file  Occupational History  . Not on file  Tobacco Use  . Smoking status: Former Smoker    Packs/day: 3.00    Years: 30.00    Pack years: 90.00    Quit date: 02/03/1989    Years since quitting: 31.0  . Smokeless tobacco: Never Used  Vaping Use  . Vaping Use: Never used  Substance and Sexual Activity  . Alcohol use: Yes  . Drug use: No  . Sexual activity: Not on file  Other Topics Concern  . Not on file  Social History Narrative  . Not on file   Social Determinants of Health   Financial Resource Strain: Not on file  Food Insecurity: Not on file  Transportation Needs: Not on file  Physical Activity: Not on file  Stress: Not on  file  Social Connections: Not on file     Family History: The patient's family history includes AAA (abdominal aortic aneurysm) in her mother; Cancer in her brother; Heart attack in her father; Varicose Veins in her mother.  ROS:   Please see the history of present illness.     All other systems reviewed and are negative.  EKGs/Labs/Other Studies Reviewed:    The following studies were reviewed today:   EKG:  EKG is  ordered today.  The ekg ordered today demonstrates atrial fibrillation, RVR, heart rate 139  Recent Labs: 01/01/2020: Magnesium 2.2 01/03/2020: ALT 44; BUN 27; Creatinine, Ser 0.74; Hemoglobin 12.1; Platelets 442; Potassium 2.9; Sodium 135  Recent Lipid Panel No results found for: CHOL, TRIG, HDL, CHOLHDL, VLDL, LDLCALC, LDLDIRECT   Risk Assessment/Calculations:      Physical Exam:    VS:  BP 106/62 (BP Location: Right Arm, Patient Position: Sitting, Cuff Size: Normal)   Pulse (!) 139   Ht 5\' 1"  (1.549 m)   Wt 158 lb (71.7 kg)   SpO2 94%   BMI 29.85 kg/m     Wt Readings from Last 3 Encounters:  02/22/20 158 lb (71.7 kg)  02/01/20 154 lb (69.9 kg)  01/11/20 158 lb (71.7 kg)     GEN:  Well nourished, well developed in no acute distress HEENT: Normal NECK: No JVD; No carotid bruits LYMPHATICS: No lymphadenopathy CARDIAC: Distant heart sounds, irregular irregular, tachycardic RESPIRATORY: Mild expiratory wheezing at right lung base ABDOMEN: Soft, non-tender, distended MUSCULOSKELETAL:  2+ edema; No deformity  SKIN: Warm and dry NEUROLOGIC:  Alert and oriented x 3 PSYCHIATRIC:  Normal affect   ASSESSMENT:    1. Atrial fibrillation, unspecified type ([place])   2. Dyspnea on exertion   3. Edema leg   4. Primary hypertension    PLAN:    In order of problems listed above:  1. Persistent atrial fibrillation, rapid ventricular response.  CHA2DS2-VASc of 3(htn, gender, age) stop atenolol, start Lopressor 25 mg twice daily.  Continue Xarelto 20 mg  daily. 2. Dyspnea on exertion, get echocardiogram to evaluate systolic and diastolic function.  Has risk factors of hypertension, former smoker.  Will consider stress test at follow-up. 3. Lower extremity edema, history of varicose veins, venous insufficiency.  In light of A. fib, diastolic dysfunction also possible.  Leg raising advised.  Start torsemide 20 mg daily.  Check BMP in 5 days. 4. Hypertension, BP controlled.  Stop Zestoretic, stop atenolol.  Start Lopressor and torsemide as above.  Follow-up in 2 weeks.    Medication Adjustments/Labs and Tests Ordered: Current medicines are reviewed at length with the patient today.  Concerns regarding medicines  are outlined above.  Orders Placed This Encounter  Procedures  . Basic metabolic panel  . EKG 12-Lead  . ECHOCARDIOGRAM COMPLETE   Meds ordered this encounter  Medications  . torsemide (DEMADEX) 20 MG tablet    Sig: Take 1 tablet (20 mg total) by mouth once for 1 dose.    Dispense:  90 tablet    Refill:  3  . metoprolol tartrate (LOPRESSOR) 25 MG tablet    Sig: Take 1 tablet (25 mg total) by mouth 2 (two) times daily.    Dispense:  180 tablet    Refill:  3    Patient Instructions  Medication Instructions:  Your physician has recommended you make the following change in your medication:   1.  STOP taking your Atenolol.  2.  STOP taking your Zestoretic.  3.  START taking Lopressor 25 MG: Take one tab by mouth twice a day.  4.  START taking Torsemide 20 MG: Take one tab by mouth once a day.   Lab Work:  Your physician recommends that you return for lab work in: Monday 02/28/20   Testing/Procedures:  Your physician has requested that you have an echocardiogram. Echocardiography is a painless test that uses sound waves to create images of your heart. It provides your doctor with information about the size and shape of your heart and how well your heart's chambers and valves are working. This procedure takes  approximately one hour. There are no restrictions for this procedure.   Follow-Up: At [facility], you and your health needs are our priority.  As part of our continuing mission to provide you with exceptional heart care, we have created designated Provider Care Teams.  These Care Teams include your primary Cardiologist (physician) and Advanced Practice Providers (APPs -  Physician Assistants and Nurse Practitioners) who all work together to provide you with the care you need, when you need it.  We recommend signing up for the patient portal called "MyChart".  Sign up information is provided on this After Visit Summary.  MyChart is used to connect with patients for Virtual Visits (Telemedicine).  Patients are able to view lab/test results, encounter notes, upcoming appointments, etc.  Non-urgent messages can be sent to your provider as well.   To learn more about what you can do with MyChart, go to [email or web address].    Your next appointment:  January 4th, 2022   The format for your next appointment:   In Person  Provider:   Kate Sable, MD   Other Instructions      Signed, Kate Sable, MD  02/22/2020 10:17 AM    [place]

## 2020-02-22 NOTE — Patient Instructions (Addendum)
Medication Instructions:  Your physician has recommended you make the following change in your medication:   1.  STOP taking your Atenolol.  2.  STOP taking your Zestoretic.  3.  START taking Lopressor 25 MG: Take one tab by mouth twice a day.  4.  START taking Torsemide 20 MG: Take one tab by mouth once a day.   Lab Work:  Your physician recommends that you return for lab work in: Monday 02/28/20   Testing/Procedures:  Your physician has requested that you have an echocardiogram. Echocardiography is a painless test that uses sound waves to create images of your heart. It provides your doctor with information about the size and shape of your heart and how well your hearts chambers and valves are working. This procedure takes approximately one hour. There are no restrictions for this procedure.   Follow-Up: At [facility], you and your health needs are our priority.  As part of our continuing mission to provide you with exceptional heart care, we have created designated Provider Care Teams.  These Care Teams include your primary Cardiologist (physician) and Advanced Practice Providers (APPs -  Physician Assistants and Nurse Practitioners) who all work together to provide you with the care you need, when you need it.  We recommend signing up for the patient portal called "MyChart".  Sign up information is provided on this After Visit Summary.  MyChart is used to connect with patients for Virtual Visits (Telemedicine).  Patients are able to view lab/test results, encounter notes, upcoming appointments, etc.  Non-urgent messages can be sent to your provider as well.   To learn more about what you can do with MyChart, go to [email or web address].    Your next appointment:  January 4th, 2022   The format for your next appointment:   In Person  Provider:   Kate Sable, MD   Other Instructions

## 2020-02-24 LAB — ECHOCARDIOGRAM COMPLETE
AR max vel: 3.3 cm2
AV Area VTI: 3.54 cm2
AV Area mean vel: 2.85 cm2
AV Mean grad: 1 mmHg
AV Peak grad: 1.7 mmHg
Ao pk vel: 0.65 m/s
Calc EF: 19.6 %
Height: 61 in
Single Plane A2C EF: 23.5 %
Single Plane A4C EF: 19.4 %
Weight: 2528 oz

## 2020-02-26 ENCOUNTER — Other Ambulatory Visit: Payer: Self-pay

## 2020-02-26 ENCOUNTER — Emergency Department: Payer: Medicare HMO

## 2020-02-26 ENCOUNTER — Inpatient Hospital Stay
Admission: EM | Admit: 2020-02-26 | Discharge: 2020-03-02 | DRG: 444 | Disposition: A | Discharge status: Home Health | Payer: Medicare HMO | Attending: Internal Medicine | Admitting: Internal Medicine

## 2020-02-26 DIAGNOSIS — I110 Hypertensive heart disease with heart failure: Secondary | ICD-10-CM | POA: Diagnosis not present

## 2020-02-26 DIAGNOSIS — K810 Acute cholecystitis: Secondary | ICD-10-CM

## 2020-02-26 DIAGNOSIS — I5021 Acute systolic (congestive) heart failure: Secondary | ICD-10-CM | POA: Diagnosis not present

## 2020-02-26 DIAGNOSIS — R4182 Altered mental status, unspecified: Secondary | ICD-10-CM | POA: Diagnosis present

## 2020-02-26 DIAGNOSIS — R1011 Right upper quadrant pain: Secondary | ICD-10-CM

## 2020-02-26 DIAGNOSIS — I5033 Acute on chronic diastolic (congestive) heart failure: Secondary | ICD-10-CM | POA: Diagnosis not present

## 2020-02-26 DIAGNOSIS — R1310 Dysphagia, unspecified: Secondary | ICD-10-CM

## 2020-02-26 DIAGNOSIS — I429 Cardiomyopathy, unspecified: Secondary | ICD-10-CM | POA: Diagnosis present

## 2020-02-26 DIAGNOSIS — I480 Paroxysmal atrial fibrillation: Secondary | ICD-10-CM

## 2020-02-26 DIAGNOSIS — K8020 Calculus of gallbladder without cholecystitis without obstruction: Secondary | ICD-10-CM | POA: Diagnosis not present

## 2020-02-26 DIAGNOSIS — Z79899 Other long term (current) drug therapy: Secondary | ICD-10-CM

## 2020-02-26 DIAGNOSIS — Z7901 Long term (current) use of anticoagulants: Secondary | ICD-10-CM

## 2020-02-26 DIAGNOSIS — I4891 Unspecified atrial fibrillation: Secondary | ICD-10-CM | POA: Diagnosis not present

## 2020-02-26 DIAGNOSIS — E876 Hypokalemia: Secondary | ICD-10-CM | POA: Diagnosis not present

## 2020-02-26 DIAGNOSIS — Z87891 Personal history of nicotine dependence: Secondary | ICD-10-CM

## 2020-02-26 DIAGNOSIS — I5043 Acute on chronic combined systolic (congestive) and diastolic (congestive) heart failure: Secondary | ICD-10-CM | POA: Diagnosis present

## 2020-02-26 DIAGNOSIS — Z8616 Personal history of COVID-19: Secondary | ICD-10-CM

## 2020-02-26 DIAGNOSIS — I4819 Other persistent atrial fibrillation: Secondary | ICD-10-CM | POA: Diagnosis present

## 2020-02-26 DIAGNOSIS — K8010 Calculus of gallbladder with chronic cholecystitis without obstruction: Secondary | ICD-10-CM

## 2020-02-26 DIAGNOSIS — G4700 Insomnia, unspecified: Secondary | ICD-10-CM | POA: Diagnosis present

## 2020-02-26 DIAGNOSIS — D72829 Elevated white blood cell count, unspecified: Secondary | ICD-10-CM | POA: Diagnosis not present

## 2020-02-26 DIAGNOSIS — R109 Unspecified abdominal pain: Secondary | ICD-10-CM

## 2020-02-26 DIAGNOSIS — D75839 Thrombocytosis, unspecified: Secondary | ICD-10-CM | POA: Diagnosis present

## 2020-02-26 DIAGNOSIS — J9611 Chronic respiratory failure with hypoxia: Secondary | ICD-10-CM | POA: Diagnosis present

## 2020-02-26 DIAGNOSIS — R451 Restlessness and agitation: Secondary | ICD-10-CM | POA: Diagnosis present

## 2020-02-26 DIAGNOSIS — E43 Unspecified severe protein-calorie malnutrition: Secondary | ICD-10-CM | POA: Diagnosis present

## 2020-02-26 DIAGNOSIS — R945 Abnormal results of liver function studies: Secondary | ICD-10-CM | POA: Diagnosis not present

## 2020-02-26 DIAGNOSIS — F419 Anxiety disorder, unspecified: Secondary | ICD-10-CM | POA: Diagnosis present

## 2020-02-26 DIAGNOSIS — R7401 Elevation of levels of liver transaminase levels: Secondary | ICD-10-CM | POA: Diagnosis not present

## 2020-02-26 DIAGNOSIS — K819 Cholecystitis, unspecified: Secondary | ICD-10-CM

## 2020-02-26 DIAGNOSIS — K8000 Calculus of gallbladder with acute cholecystitis without obstruction: Principal | ICD-10-CM

## 2020-02-26 DIAGNOSIS — E871 Hypo-osmolality and hyponatremia: Secondary | ICD-10-CM | POA: Diagnosis present

## 2020-02-26 DIAGNOSIS — Z01810 Encounter for preprocedural cardiovascular examination: Secondary | ICD-10-CM | POA: Diagnosis not present

## 2020-02-26 DIAGNOSIS — D7389 Other diseases of spleen: Secondary | ICD-10-CM | POA: Diagnosis present

## 2020-02-26 DIAGNOSIS — N179 Acute kidney failure, unspecified: Secondary | ICD-10-CM | POA: Diagnosis not present

## 2020-02-26 DIAGNOSIS — I5023 Acute on chronic systolic (congestive) heart failure: Secondary | ICD-10-CM | POA: Diagnosis not present

## 2020-02-26 DIAGNOSIS — I5040 Unspecified combined systolic (congestive) and diastolic (congestive) heart failure: Secondary | ICD-10-CM | POA: Diagnosis not present

## 2020-02-26 DIAGNOSIS — I340 Nonrheumatic mitral (valve) insufficiency: Secondary | ICD-10-CM | POA: Diagnosis present

## 2020-02-26 DIAGNOSIS — Z7982 Long term (current) use of aspirin: Secondary | ICD-10-CM

## 2020-02-26 DIAGNOSIS — Z86718 Personal history of other venous thrombosis and embolism: Secondary | ICD-10-CM

## 2020-02-26 DIAGNOSIS — Z8249 Family history of ischemic heart disease and other diseases of the circulatory system: Secondary | ICD-10-CM

## 2020-02-26 DIAGNOSIS — Z9981 Dependence on supplemental oxygen: Secondary | ICD-10-CM

## 2020-02-26 DIAGNOSIS — Z79890 Hormone replacement therapy (postmenopausal): Secondary | ICD-10-CM

## 2020-02-26 DIAGNOSIS — I509 Heart failure, unspecified: Secondary | ICD-10-CM | POA: Diagnosis not present

## 2020-02-26 DIAGNOSIS — J449 Chronic obstructive pulmonary disease, unspecified: Secondary | ICD-10-CM | POA: Diagnosis present

## 2020-02-26 DIAGNOSIS — R748 Abnormal levels of other serum enzymes: Secondary | ICD-10-CM

## 2020-02-26 DIAGNOSIS — Z7951 Long term (current) use of inhaled steroids: Secondary | ICD-10-CM

## 2020-02-26 DIAGNOSIS — I482 Chronic atrial fibrillation, unspecified: Secondary | ICD-10-CM | POA: Diagnosis not present

## 2020-02-26 LAB — COMPREHENSIVE METABOLIC PANEL
ALT: 276 U/L — ABNORMAL HIGH (ref 0–44)
AST: 398 U/L — ABNORMAL HIGH (ref 15–41)
Albumin: 3.4 g/dL — ABNORMAL LOW (ref 3.5–5.0)
Alkaline Phosphatase: 95 U/L (ref 38–126)
Anion gap: 14 (ref 5–15)
BUN: 28 mg/dL — ABNORMAL HIGH (ref 8–23)
CO2: 26 mmol/L (ref 22–32)
Calcium: 9.4 mg/dL (ref 8.9–10.3)
Chloride: 90 mmol/L — ABNORMAL LOW (ref 98–111)
Creatinine, Ser: 1.4 mg/dL — ABNORMAL HIGH (ref 0.44–1.00)
GFR, Estimated: 39 mL/min — ABNORMAL LOW (ref >60)
Glucose, Bld: 118 mg/dL — ABNORMAL HIGH (ref 70–99)
Potassium: 3.6 mmol/L (ref 3.5–5.1)
Sodium: 130 mmol/L — ABNORMAL LOW (ref 135–145)
Total Bilirubin: 1.8 mg/dL — ABNORMAL HIGH (ref 0.3–1.2)
Total Protein: 7 g/dL (ref 6.5–8.1)

## 2020-02-26 LAB — CBC WITH DIFFERENTIAL/PLATELET
Abs Immature Granulocytes: 0.14 10*3/uL — ABNORMAL HIGH (ref 0.00–0.07)
Basophils Absolute: 0 10*3/uL (ref 0.0–0.1)
Basophils Relative: 0 %
Eosinophils Absolute: 0 10*3/uL (ref 0.0–0.5)
Eosinophils Relative: 0 %
HCT: 37.2 % (ref 36.0–46.0)
Hemoglobin: 11.4 g/dL — ABNORMAL LOW (ref 12.0–15.0)
Immature Granulocytes: 1 %
Lymphocytes Relative: 18 %
Lymphs Abs: 2.4 10*3/uL (ref 0.7–4.0)
MCH: 24.2 pg — ABNORMAL LOW (ref 26.0–34.0)
MCHC: 30.6 g/dL (ref 30.0–36.0)
MCV: 78.8 fL — ABNORMAL LOW (ref 80.0–100.0)
Monocytes Absolute: 1.5 10*3/uL — ABNORMAL HIGH (ref 0.1–1.0)
Monocytes Relative: 11 %
Neutro Abs: 9.5 10*3/uL — ABNORMAL HIGH (ref 1.7–7.7)
Neutrophils Relative %: 70 %
Platelets: 621 10*3/uL — ABNORMAL HIGH (ref 150–400)
RBC: 4.72 MIL/uL (ref 3.87–5.11)
RDW: 16.1 % — ABNORMAL HIGH (ref 11.5–15.5)
WBC: 13.6 10*3/uL — ABNORMAL HIGH (ref 4.0–10.5)
nRBC: 0.1 % (ref 0.0–0.2)

## 2020-02-26 LAB — TROPONIN I (HIGH SENSITIVITY)
Troponin I (High Sensitivity): 15 ng/L (ref <18)
Troponin I (High Sensitivity): 13 ng/L (ref <18)

## 2020-02-26 LAB — RESP PANEL BY RT-PCR (FLU A&B, COVID) ARPGX2
Influenza A by PCR: NEGATIVE
Influenza B by PCR: NEGATIVE
SARS Coronavirus 2 by RT PCR: NEGATIVE

## 2020-02-26 LAB — BRAIN NATRIURETIC PEPTIDE: B Natriuretic Peptide: 1132.2 pg/mL — ABNORMAL HIGH (ref 0.0–100.0)

## 2020-02-26 LAB — LIPASE, BLOOD: Lipase: 26 U/L (ref 11–51)

## 2020-02-26 LAB — PROCALCITONIN: Procalcitonin: 0.12 ng/mL

## 2020-02-26 MED ORDER — PIPERACILLIN-TAZOBACTAM 3.375 G IVPB
3.3750 g | Freq: Three times a day (TID) | INTRAVENOUS | Status: DC
  Administered 2020-02-27 – 2020-03-02 (×15): 3.375 g via INTRAVENOUS
  Filled 2020-02-26 (×15): qty 50

## 2020-02-26 MED ORDER — ALBUTEROL SULFATE HFA 108 (90 BASE) MCG/ACT IN AERS
2.0000 | INHALATION_SPRAY | Freq: Four times a day (QID) | RESPIRATORY_TRACT | Status: DC | PRN
  Administered 2020-02-26: 21:00:00 2 via RESPIRATORY_TRACT
  Filled 2020-02-26 (×2): qty 6.7

## 2020-02-26 MED ORDER — ACETAMINOPHEN 325 MG PO TABS
650.0000 mg | ORAL_TABLET | ORAL | Status: DC | PRN
  Administered 2020-02-26 – 2020-02-28 (×3): 650 mg via ORAL
  Filled 2020-02-26 (×3): qty 2

## 2020-02-26 MED ORDER — SODIUM CHLORIDE 0.9% FLUSH
3.0000 mL | Freq: Two times a day (BID) | INTRAVENOUS | Status: DC
  Administered 2020-02-27 – 2020-03-02 (×9): 3 mL via INTRAVENOUS

## 2020-02-26 MED ORDER — DILTIAZEM HCL 25 MG/5ML IV SOLN: 10.0000 mg | Freq: Once | INTRAVENOUS | Status: AC

## 2020-02-26 MED ORDER — SODIUM CHLORIDE 0.9 % IV SOLN
250.0000 mL | INTRAVENOUS | Status: DC | PRN
  Administered 2020-02-29 – 2020-03-02 (×3): 250 mL via INTRAVENOUS

## 2020-02-26 MED ORDER — RIVAROXABAN 10 MG PO TABS
10.0000 mg | ORAL_TABLET | Freq: Every day | ORAL | Status: DC
  Administered 2020-02-26: 21:00:00 10 mg via ORAL
  Filled 2020-02-26 (×2): qty 1

## 2020-02-26 MED ORDER — CLONAZEPAM 0.5 MG PO TABS
0.5000 mg | ORAL_TABLET | Freq: Every day | ORAL | Status: DC | PRN
  Administered 2020-02-26: 21:00:00 0.5 mg via ORAL
  Filled 2020-02-26: qty 1

## 2020-02-26 MED ORDER — METOPROLOL TARTRATE 25 MG PO TABS
25.0000 mg | ORAL_TABLET | Freq: Two times a day (BID) | ORAL | Status: DC
  Administered 2020-02-26 – 2020-02-27 (×2): 25 mg via ORAL
  Filled 2020-02-26 (×2): qty 1

## 2020-02-26 MED ORDER — FUROSEMIDE 10 MG/ML IJ SOLN
40.0000 mg | Freq: Two times a day (BID) | INTRAMUSCULAR | Status: DC
  Administered 2020-02-26 – 2020-02-27 (×3): 40 mg via INTRAVENOUS
  Filled 2020-02-26 (×3): qty 4

## 2020-02-26 MED ORDER — PIPERACILLIN-TAZOBACTAM 3.375 G IVPB 30 MIN
3.3750 g | Freq: Once | INTRAVENOUS | Status: AC
  Administered 2020-02-26: 16:00:00 3.375 g via INTRAVENOUS
  Filled 2020-02-26: qty 50

## 2020-02-26 MED ORDER — PIPERACILLIN-TAZOBACTAM 3.375 G IVPB 30 MIN: 3.3750 g | Freq: Three times a day (TID) | INTRAVENOUS | Status: DC

## 2020-02-26 MED ORDER — DILTIAZEM HCL-DEXTROSE 125-5 MG/125ML-% IV SOLN (PREMIX)
5.0000 mg/h | INTRAVENOUS | Status: DC
  Administered 2020-02-26: 13:00:00 5 mg/h via INTRAVENOUS
  Administered 2020-02-27: 17:00:00 7.5 mg/h via INTRAVENOUS
  Administered 2020-02-27: 07:00:00 15 mg/h via INTRAVENOUS
  Filled 2020-02-26 (×3): qty 125

## 2020-02-26 MED ORDER — SODIUM CHLORIDE 0.9% FLUSH: 3.0000 mL | INTRAVENOUS | Status: DC | PRN

## 2020-02-26 MED ORDER — SODIUM CHLORIDE 0.9 % IV BOLUS
1000.0000 mL | Freq: Once | INTRAVENOUS | Status: AC
  Administered 2020-02-26: 13:00:00 1000 mL via INTRAVENOUS

## 2020-02-26 MED ORDER — ASPIRIN EC 81 MG PO TBEC
81.0000 mg | DELAYED_RELEASE_TABLET | Freq: Every day | ORAL | Status: DC
  Administered 2020-02-26 – 2020-02-27 (×2): 81 mg via ORAL
  Filled 2020-02-26 (×2): qty 1

## 2020-02-26 MED ORDER — FLUTICASONE FUROATE-VILANTEROL 100-25 MCG/INH IN AEPB
1.0000 | INHALATION_SPRAY | Freq: Every day | RESPIRATORY_TRACT | Status: DC
  Administered 2020-02-27 – 2020-03-02 (×5): 1 via RESPIRATORY_TRACT
  Filled 2020-02-26 (×2): qty 28

## 2020-02-26 MED ORDER — ONDANSETRON HCL 4 MG/2ML IJ SOLN
4.0000 mg | Freq: Four times a day (QID) | INTRAMUSCULAR | Status: DC | PRN
  Administered 2020-02-28: 10:00:00 4 mg via INTRAVENOUS
  Filled 2020-02-26: qty 2

## 2020-02-26 MED ORDER — ESTRADIOL 1 MG PO TABS
0.5000 mg | ORAL_TABLET | Freq: Every day | ORAL | Status: DC
  Administered 2020-02-26 – 2020-02-28 (×3): 0.5 mg via ORAL
  Filled 2020-02-26 (×4): qty 0.5

## 2020-02-26 MED ORDER — DILTIAZEM HCL 25 MG/5ML IV SOLN
INTRAVENOUS | Status: AC
  Administered 2020-02-26: 13:00:00 10 mg via INTRAVENOUS
  Filled 2020-02-26: qty 5

## 2020-02-26 NOTE — ED Provider Notes (Signed)
[facility] Emergency Department Provider Note   ____________________________________________   Event Date/Time   First MD Initiated Contact with Patient 02/26/20 1221     (approximate)  I have reviewed the triage vital signs and the nursing notes.   HISTORY  Chief Complaint Abdominal Pain    HPI Paula Klein is a 74 y.o. female patient complaining of some shortness of breath and right lower quadrant pain.  She has known gallstones but says he has been told she is too weak for surgery.  She is tachycardic and tachypneic on arrival.  Looks uncomfortable in the ER.  Abdominal pain is severe.  On the monitor she has A. fib with RVR.  Rate is improved markedly with diltiazem IV.        Past Medical History:  Diagnosis Date   H/O blood clots    Heart murmur    Hypertension     Patient Active Problem List   Diagnosis Date Noted   Acute on chronic diastolic (congestive) heart failure ([place]) 02/26/2020   Essential hypertension 12/31/2019   History of DVT of lower extremity 12/31/2019   Pneumonia due to COVID-19 virus 12/31/2019   Acute bronchitis 03/02/2018   Varicose veins of both lower extremities with inflammation 08/31/2017   Chronic venous insufficiency 08/31/2017   Swelling of limb 08/31/2017    Past Surgical History:  Procedure Laterality Date   COLONOSCOPY WITH PROPOFOL N/A 09/26/2015   Procedure: COLONOSCOPY WITH PROPOFOL;  Surgeon: Lollie Sails, MD;  Location: [facility] ENDOSCOPY;  Service: Endoscopy;  Laterality: N/A;   NO PAST SURGERIES      Prior to Admission medications   Medication Sig Start Date End Date Taking? Authorizing Provider  albuterol (PROVENTIL HFA;VENTOLIN HFA) 108 (90 Base) MCG/ACT inhaler Inhale 2 puffs into the lungs every 6 (six) hours as needed for shortness of breath.    [provider]  Ascorbic Acid (VITAMIN C) 1000 MG tablet Take 1,000 mg by mouth daily.     [provider]   aspirin EC 81 MG tablet Take 81 mg by mouth daily. Swallow whole.    [provider]  calcium carbonate (OS-CAL - DOSED IN MG OF ELEMENTAL CALCIUM) 1250 (500 Ca) MG tablet Take 1 tablet by mouth daily.    [provider]  Cholecalciferol (VITAMIN D3 PO) Take 500 Units by mouth daily.    [provider]  clonazePAM (KLONOPIN) 0.5 MG tablet Take 0.5 mg by mouth daily as needed for anxiety.    [provider]  estradiol (ESTRACE) 0.5 MG tablet Take 0.5 mg by mouth daily.    [provider]  fluticasone furoate-vilanterol (BREO ELLIPTA) 100-25 MCG/INH AEPB Inhale 1 puff into the lungs daily. 08/17/18   Wilhelmina Mcardle, MD  metoprolol tartrate (LOPRESSOR) 25 MG tablet Take 1 tablet (25 mg total) by mouth 2 (two) times daily. 02/22/20 05/22/20  Kate Sable, MD  Omega-3 Fatty Acids (FISH OIL PO) Take 1,200 mg by mouth daily.     [provider]  ondansetron (ZOFRAN ODT) 4 MG disintegrating tablet Take 1 tablet (4 mg total) by mouth every 8 (eight) hours as needed for nausea. 12/26/19   Noemi Chapel, MD  potassium chloride SA (KLOR-CON) 20 MEQ tablet Take 1 tablet (20 mEq total) by mouth daily for 5 days. 12/26/19 02/01/20  Noemi Chapel, MD  quiNINE [facility]lin Hilding) 324 MG capsule Take 324 mg by mouth daily at 8 pm. 03/23/19   [provider]  torsemide [facility])  20 MG tablet Take 1 tablet (20 mg total) by mouth once for 1 dose. 02/22/20 02/22/20  Kate Sable, MD  vitamin E 1000 UNIT capsule Take 1,000 Units by mouth daily.    [provider]  XARELTO 10 MG TABS tablet Take 2 tablets (20 mg total) by mouth daily. Patient taking differently: Take 10 mg by mouth daily. 01/02/20   Barton Dubois, MD    Allergies Pine tar, Sulfa antibiotics, and Tetracyclines & related  Family History  Problem Relation Age of Onset   Varicose Veins Mother    AAA (abdominal aortic aneurysm) Mother    Heart attack Father    Cancer  Brother     Social History Social History   Tobacco Use   Smoking status: Former Smoker    Packs/day: 3.00    Years: 30.00    Pack years: 90.00    Quit date: 02/03/1989    Years since quitting: 31.0   Smokeless tobacco: Never Used  Vaping Use   Vaping Use: Never used  Substance Use Topics   Alcohol use: Yes   Drug use: No    Review of Systems  Constitutional: No fever/chills Eyes: No visual changes. ENT: No sore throat. Cardiovascular: Denies chest pain. Respiratory: Some shortness of breath. Gastrointestinal:  abdominal pain.  No nausea, no vomiting.  No diarrhea.  No constipation. Genitourinary: Negative for dysuria. Musculoskeletal: Negative for back pain. Skin: Negative for rash. Neurological: Negative for headaches, focal weakness   ____________________________________________   PHYSICAL EXAM:  VITAL SIGNS: ED Triage Vitals  Enc Vitals Group     BP 02/26/20 1213 (!) 110/91     Pulse Rate 02/26/20 1213 (!) 198     Resp 02/26/20 1213 (!) 32     Temp 02/26/20 1213 98.1 F (36.7 C)     Temp Source 02/26/20 1213 Oral     SpO2 02/26/20 1213 94 %     Weight --      Height --      Head Circumference --      Peak Flow --      Pain Score 02/26/20 1215 9     Pain Loc --      Pain Edu? --      Excl. in [place]? --     Constitutional: Alert and oriented.  Eyes: Conjunctivae are normal. PERRL. EOMI. Head: Atraumatic. Nose: No congestion/rhinnorhea. Mouth/Throat: Mucous membranes are moist.  Oropharynx non-erythematous. Neck: No stridor.   Cardiovascular: Normal rate, regular rhythm. Grossly normal heart sounds.  Good peripheral circulation. Respiratory: Increased respiratory effort.  No retractions. Lungs CTAB. Gastrointestinal: Soft tender in the right upper quadrant. No distention. No abdominal bruits. No CVA tenderness. Musculoskeletal: No lower extremity tenderness trace bilateral edema.  Neurologic:  Normal speech and language. No gross focal neurologic  deficits are appreciated.  Skin:  Skin is warm, dry and intact. No rash noted.  ____________________________________________   LABS (all labs ordered are listed, but only abnormal results are displayed)  Labs Reviewed  COMPREHENSIVE METABOLIC PANEL - Abnormal; Notable for the following components:      Result Value   Sodium 130 (*)    Chloride 90 (*)    Glucose, Bld 118 (*)    BUN 28 (*)    Creatinine, Ser 1.40 (*)    Albumin 3.4 (*)    AST 398 (*)    ALT 276 (*)    Total Bilirubin 1.8 (*)    GFR, Estimated 39 (*)    All other  components within normal limits  BRAIN NATRIURETIC PEPTIDE - Abnormal; Notable for the following components:   B Natriuretic Peptide 1,132.2 (*)    All other components within normal limits  RESP PANEL BY RT-PCR (FLU A&B, COVID) ARPGX2  LIPASE, BLOOD  URINALYSIS, COMPLETE (UACMP) WITH MICROSCOPIC  CBC WITH DIFFERENTIAL/PLATELET  PROCALCITONIN  TROPONIN I (HIGH SENSITIVITY)  TROPONIN I (HIGH SENSITIVITY)   ____________________________________________  EKG  EKG read interpreted by me shows A. fib at 146 normal axis some slight rate related changes ____________________________________________  RADIOLOGY Gertha Calkin, personally viewed and evaluated these images (plain radiographs) as part of my medical decision making, as well as reviewing the written report by the radiologist.  ED MD interpretation: Chest x-ray with bilateral pleural effusions and what appears to be to be CHF especially since patient has a BNP of 8132  Official radiology report(s): DG Chest Portable 1 View  Result Date: 02/26/2020 CLINICAL DATA:  Weakness EXAM: PORTABLE CHEST 1 VIEW COMPARISON:  12/31/2019 FINDINGS: Stable heart size. Atherosclerotic calcification of the aortic knob. Low lung volumes. Mild interstitial prominence bilaterally. Small bilateral pleural effusions, left greater than right. Associated bibasilar opacities. Aeration of the mid to upper lung fields  has improved compared to the previous radiograph. No pneumothorax. IMPRESSION: Small bilateral pleural effusions, left greater than right. Associated bibasilar opacities, may represent atelectasis and/or pneumonia. Electronically Signed   By: Davina Poke D.O.   On: 02/26/2020 12:59   US Abdomen Limited RUQ (LIVER/GB)  Result Date: 02/26/2020 CLINICAL DATA:  RIGHT upper quadrant pain. EXAM: ULTRASOUND ABDOMEN LIMITED RIGHT UPPER QUADRANT COMPARISON:  None. FINDINGS: Gallbladder: Multiple shadowing gallstones within lumen the gallbladder measuring size from 5-10 mm. Positive sonographic Murphy's sign. No pericholecystic fluid. Common bile duct: Diameter: Normal at 4 mm Liver: Mildly complex cyst in the RIGHT hepatic lobe corresponds to comparison CT. Liver parenchyma within normal limits in parenchymal echogenicity. Portal vein is patent on color Doppler imaging with normal direction of blood flow towards the liver. Other: Trace ascites.  RIGHT pleural effusion. IMPRESSION: 1. Multiple gallstones and positive sonographic Murphy's sign. Evaluate for acute cholecystitis. 2. Normal common bile duct. 3. RIGHT effusion and trace ascites. Electronically Signed   By: Suzy Bouchard M.D.   On: 02/26/2020 13:19    ____________________________________________   PROCEDURES  Procedure(s) performed (including Critical Care): Patient with 20 minutes of critical care time.  This includes reviewing her records and her studies here and talking to the hospitalist and the surgeon.  Procedures   ____________________________________________   INITIAL IMPRESSION / ASSESSMENT AND PLAN / ED COURSE  Patient comes in with A. fib with RVR rate is improved by diltiazem and controlled although we can continue to increase the diltiazem.  Patient has CHF my lab and chest x-ray and known gallstones with now belly pain and ultrasound consistent with acute cholecystitis.  I discussed the patient with Dr. Christian Mate and let  him know about it previously she has been told that she was not a surgical candidate.  Additionally I reviewed her records and spoke to the hospitalist.             ____________________________________________   FINAL CLINICAL IMPRESSION(S) / ED DIAGNOSES  Final diagnoses:  Combined systolic and diastolic congestive heart failure, unspecified HF chronicity ([place])  Cholecystitis  Right upper quadrant abdominal pain     ED Discharge Orders    None      *Please note:  Paula Klein was evaluated in Emergency Department on  02/26/2020 for the symptoms described in the history of present illness. She was evaluated in the context of the global COVID-19 pandemic, which necessitated consideration that the patient might be at risk for infection with the SARS-CoV-2 virus that causes COVID-19. Institutional protocols and algorithms that pertain to the evaluation of patients at risk for COVID-19 are in a state of rapid change based on information released by regulatory bodies including the CDC and federal and state organizations. These policies and algorithms were followed during the patient's care in the ED.  Some ED evaluations and interventions may be delayed as a result of limited staffing during and the pandemic.*   Note:  This document was prepared using Dragon voice recognition software and may include unintentional dictation errors.    Nena Polio, MD 02/26/20 [id number]

## 2020-02-26 NOTE — H&P (Addendum)
Paula Klein is an 74 y.o. female.   Status is: Inpatient  Remains inpatient appropriate because:Inpatient level of care appropriate due to severity of illness   Dispo: The patient is from: Home              Anticipated d/c is to: Home              Anticipated d/c date is: 3 days              Patient currently is not medically stable to d/c.      Chief Complaint:  Shortness of breath HPI:  Paula Klein is a 74 year old female with history of DVT on chronic anticoagulation, essential hypertension who came to the hospital with multiple complaints including short of breath, palpitation, abdominal pain and dysphagia. Patient was previously quite healthy, he developed Covid 2 months ago, was placed on 2 L of oxygen at nighttime.  Since that time, her condition has been deteriorating.  For the last 2 months, patient has not been able to walk due to leg weakness.  She also has frequent paroxysmal nocturnal dyspnea, insomnia.  She has been sleeping in a recliner for the last 2 months.  She also complaining of right upper quadrant abdominal pain intermittently with nausea and poor appetite.  She was diagnosed with gallstone, due to multiple medical problems, she was not a candidate for surgery. For the last week, patient has worsening shortness of breath.  She has a cough, largely nonproductive.  She has gained 10 pounds of body weight in the last 2 months.  She also had increased leg edema. Upon arriving the hospital, her creatinine was 1.4, sodium 130.  AST 398, ALT 276, total protein 1.8, BNP 1132, procalcitonin level 0.12, chest x-ray showed bilateral lower lobe infiltrates, small pleural effusion.  Right upper quadrant ultrasound showed cholecystitis with positive Murphy sign. Telemetry showed atrial fibrillation with rapid ventricular response.  Troponin 15   Past Medical History:  Diagnosis Date  . H/O blood clots   . Heart murmur   . Hypertension     Past Surgical History:  Procedure  Laterality Date  . COLONOSCOPY WITH PROPOFOL N/A 09/26/2015   Procedure: COLONOSCOPY WITH PROPOFOL;  Surgeon: Lollie Sails, MD;  Location: [facility] ENDOSCOPY;  Service: Endoscopy;  Laterality: N/A;  . NO PAST SURGERIES      Family History  Problem Relation Age of Onset  . Varicose Veins Mother   . AAA (abdominal aortic aneurysm) Mother   . Heart attack Father   . Cancer Brother    Social History:  reports that she quit smoking about 31 years ago. She has a 90.00 pack-year smoking history. She has never used smokeless tobacco. She reports current alcohol use. She reports that she does not use drugs.  Allergies:  Allergies  Allergen Reactions  . Pine Tar Cough  . Sulfa Antibiotics Rash  . Tetracyclines & Related Rash    (Not in a hospital admission)   Results for orders placed or performed during the hospital encounter of 02/26/20 (from the past 48 hour(s))  Lipase, blood     Status: None   Collection Time: 02/26/20 12:34 PM  Result Value Ref Range   Lipase 26 11 - 51 U/L    Comment: Performed at [facility], [address], [place], [place] [address]  Comprehensive metabolic panel     Status: Abnormal   Collection Time: 02/26/20 12:34 PM  Result Value Ref Range   Sodium 130 (  L) 135 - 145 mmol/L   Potassium 3.6 3.5 - 5.1 mmol/L   Chloride 90 (L) 98 - 111 mmol/L   CO2 26 22 - 32 mmol/L   Glucose, Bld 118 (H) 70 - 99 mg/dL    Comment: Glucose reference range applies only to samples taken after fasting for at least 8 hours.   BUN 28 (H) 8 - 23 mg/dL   Creatinine, Ser 1.40 (H) 0.44 - 1.00 mg/dL   Calcium 9.4 8.9 - 10.3 mg/dL   Total Protein 7.0 6.5 - 8.1 g/dL   Albumin 3.4 (L) 3.5 - 5.0 g/dL   AST 398 (H) 15 - 41 U/L   ALT 276 (H) 0 - 44 U/L   Alkaline Phosphatase 95 38 - 126 U/L   Total Bilirubin 1.8 (H) 0.3 - 1.2 mg/dL   GFR, Estimated 39 (L) >60 mL/min    Comment: (NOTE) Calculated using the CKD-EPI Creatinine Equation (2021)    Anion gap 14 5 - 15     Comment: Performed at [facility], [address] [place], [place] [address]  Troponin I (High Sensitivity)     Status: None   Collection Time: 02/26/20 12:34 PM  Result Value Ref Range   Troponin I (High Sensitivity) 15 <18 ng/L    Comment: (NOTE) Elevated high sensitivity troponin I (hsTnI) values and significant  changes across serial measurements may suggest ACS but many other  chronic and acute conditions are known to elevate hsTnI results.  Refer to the "Links" section for chest pain algorithms and additional  guidance. Performed at [facility], [address] [place], [place] [address]   Brain natriuretic peptide     Status: Abnormal   Collection Time: 02/26/20 12:34 PM  Result Value Ref Range   B Natriuretic Peptide 1,132.2 (H) 0.0 - 100.0 pg/mL    Comment: Performed at [facility], [address] [place], [place] [address]  Procalcitonin - Baseline     Status: None   Collection Time: 02/26/20 12:34 PM  Result Value Ref Range   Procalcitonin 0.12 ng/mL    Comment:        Interpretation: PCT (Procalcitonin) <= 0.5 ng/mL: Systemic infection (sepsis) is not likely. Local bacterial infection is possible. (NOTE)       Sepsis PCT Algorithm           Lower Respiratory Tract                                      Infection PCT Algorithm    ----------------------------     ----------------------------         PCT < 0.25 ng/mL                PCT < 0.10 ng/mL          Strongly encourage             Strongly discourage   discontinuation of antibiotics    initiation of antibiotics    ----------------------------     -----------------------------       PCT 0.25 - 0.50 ng/mL            PCT 0.10 - 0.25 ng/mL               OR       >80% decrease in PCT            Discourage initiation of  antibiotics      Encourage discontinuation           of antibiotics    ----------------------------      -----------------------------         PCT >= 0.50 ng/mL              PCT 0.26 - 0.50 ng/mL               AND        <80% decrease in PCT             Encourage initiation of                                             antibiotics       Encourage continuation           of antibiotics    ----------------------------     -----------------------------        PCT >= 0.50 ng/mL                  PCT > 0.50 ng/mL               AND         increase in PCT                  Strongly encourage                                      initiation of antibiotics    Strongly encourage escalation           of antibiotics                                     -----------------------------                                           PCT <= 0.25 ng/mL                                                 OR                                        > 80% decrease in PCT                                      Discontinue / Do not initiate                                             antibiotics  Performed at [facility], [address] [place], [place] [address]   Resp Panel by RT-PCR (Flu A&B, Covid) Nasopharyngeal Swab  Status: None   Collection Time: 02/26/20 12:43 PM   Specimen: Nasopharyngeal Swab; Nasopharyngeal(NP) swabs in vial transport medium  Result Value Ref Range   SARS Coronavirus 2 by RT PCR NEGATIVE NEGATIVE    Comment: (NOTE) SARS-CoV-2 target nucleic acids are NOT DETECTED.  The SARS-CoV-2 RNA is generally detectable in upper respiratory specimens during the acute phase of infection. The lowest concentration of SARS-CoV-2 viral copies this assay can detect is 138 copies/mL. A negative result does not preclude SARS-Cov-2 infection and should not be used as the sole basis for treatment or other patient management decisions. A negative result may occur with  improper specimen collection/handling, submission of specimen other than nasopharyngeal swab, presence of viral mutation(s) within  the areas targeted by this assay, and inadequate number of viral copies(<138 copies/mL). A negative result must be combined with clinical observations, patient history, and epidemiological information. The expected result is Negative.  Fact Sheet for Patients:  EntrepreneurPulse.com.au  Fact Sheet for Healthcare Providers:  IncredibleEmployment.be  This test is no t yet approved or cleared by the [place] FDA and  has been authorized for detection and/or diagnosis of SARS-CoV-2 by FDA under an Emergency Use Authorization (EUA). This EUA will remain  in effect (meaning this test can be used) for the duration of the COVID-19 declaration under Section 564(b)(1) of the Act, 21 U.S.C.section 360bbb-3(b)(1), unless the authorization is terminated  or revoked sooner.       Influenza A by PCR NEGATIVE NEGATIVE   Influenza B by PCR NEGATIVE NEGATIVE    Comment: (NOTE) The Xpert Xpress SARS-CoV-2/FLU/RSV plus assay is intended as an aid in the diagnosis of influenza from Nasopharyngeal swab specimens and should not be used as a sole basis for treatment. Nasal washings and aspirates are unacceptable for Xpert Xpress SARS-CoV-2/FLU/RSV testing.  Fact Sheet for Patients: EntrepreneurPulse.com.au  Fact Sheet for Healthcare Providers: IncredibleEmployment.be  This test is not yet approved or cleared by the [place] FDA and has been authorized for detection and/or diagnosis of SARS-CoV-2 by FDA under an Emergency Use Authorization (EUA). This EUA will remain in effect (meaning this test can be used) for the duration of the COVID-19 declaration under Section 564(b)(1) of the Act, 21 U.S.C. section 360bbb-3(b)(1), unless the authorization is terminated or revoked.  Performed at [facility], [address] [place], [place] [address]   CBC with Differential     Status: Abnormal   Collection Time:  02/26/20  3:52 PM  Result Value Ref Range   WBC 13.6 (H) 4.0 - 10.5 K/uL   RBC 4.72 3.87 - 5.11 MIL/uL   Hemoglobin 11.4 (L) 12.0 - 15.0 g/dL   HCT 37.2 36.0 - 46.0 %   MCV 78.8 (L) 80.0 - 100.0 fL   MCH 24.2 (L) 26.0 - 34.0 pg   MCHC 30.6 30.0 - 36.0 g/dL   RDW 16.1 (H) 11.5 - 15.5 %   Platelets 621 (H) 150 - 400 K/uL   nRBC 0.1 0.0 - 0.2 %   Neutrophils Relative % 70 %   Neutro Abs 9.5 (H) 1.7 - 7.7 K/uL   Lymphocytes Relative 18 %   Lymphs Abs 2.4 0.7 - 4.0 K/uL   Monocytes Relative 11 %   Monocytes Absolute 1.5 (H) 0.1 - 1.0 K/uL   Eosinophils Relative 0 %   Eosinophils Absolute 0.0 0.0 - 0.5 K/uL   Basophils Relative 0 %   Basophils Absolute 0.0 0.0 - 0.1 K/uL   Immature Granulocytes 1 %  Abs Immature Granulocytes 0.14 (H) 0.00 - 0.07 K/uL    Comment: Performed at [facility], [address] [place], [place] [address]   DG Chest Portable 1 View  Result Date: 02/26/2020 CLINICAL DATA:  Weakness EXAM: PORTABLE CHEST 1 VIEW COMPARISON:  12/31/2019 FINDINGS: Stable heart size. Atherosclerotic calcification of the aortic knob. Low lung volumes. Mild interstitial prominence bilaterally. Small bilateral pleural effusions, left greater than right. Associated bibasilar opacities. Aeration of the mid to upper lung fields has improved compared to the previous radiograph. No pneumothorax. IMPRESSION: Small bilateral pleural effusions, left greater than right. Associated bibasilar opacities, may represent atelectasis and/or pneumonia. Electronically Signed   By: Davina Poke D.O.   On: 02/26/2020 12:59   US Abdomen Limited RUQ (LIVER/GB)  Result Date: 02/26/2020 CLINICAL DATA:  RIGHT upper quadrant pain. EXAM: ULTRASOUND ABDOMEN LIMITED RIGHT UPPER QUADRANT COMPARISON:  None. FINDINGS: Gallbladder: Multiple shadowing gallstones within lumen the gallbladder measuring size from 5-10 mm. Positive sonographic Murphy's sign. No pericholecystic fluid. Common bile duct: Diameter:  Normal at 4 mm Liver: Mildly complex cyst in the RIGHT hepatic lobe corresponds to comparison CT. Liver parenchyma within normal limits in parenchymal echogenicity. Portal vein is patent on color Doppler imaging with normal direction of blood flow towards the liver. Other: Trace ascites.  RIGHT pleural effusion. IMPRESSION: 1. Multiple gallstones and positive sonographic Murphy's sign. Evaluate for acute cholecystitis. 2. Normal common bile duct. 3. RIGHT effusion and trace ascites. Electronically Signed   By: Suzy Bouchard M.D.   On: 02/26/2020 13:19    Review of Systems  Constitutional: Positive for activity change, appetite change, fatigue and unexpected weight change. Negative for chills, diaphoresis and fever.  HENT: Negative for ear pain, postnasal drip and rhinorrhea.   Eyes: Negative for photophobia, pain and redness.  Respiratory: Positive for cough, choking and shortness of breath. Negative for chest tightness and wheezing.   Cardiovascular: Positive for palpitations and leg swelling. Negative for chest pain.  Gastrointestinal: Positive for abdominal distention, diarrhea and nausea. Negative for abdominal pain, constipation and vomiting.  Endocrine: Negative for cold intolerance, heat intolerance and polydipsia.  Genitourinary: Negative for dysuria, flank pain and frequency.  Musculoskeletal: Negative for back pain, myalgias and neck pain.  Allergic/Immunologic: Negative for environmental allergies.  Neurological: Positive for dizziness and light-headedness. Negative for syncope and speech difficulty.  Psychiatric/Behavioral: Negative for agitation and confusion.    Blood pressure (!) 119/94, pulse (!) 118, temperature 98.1 F (36.7 C), temperature source Oral, resp. rate (!) 28, SpO2 96 %. Physical Exam Constitutional:      General: She is not in acute distress.    Appearance: She is ill-appearing. She is not toxic-appearing or diaphoretic.  HENT:     Head: Normocephalic and  atraumatic.     Mouth/Throat:     Mouth: Mucous membranes are moist.     Pharynx: Oropharynx is clear.  Eyes:     General: No scleral icterus.    Extraocular Movements: Extraocular movements intact.     Pupils: Pupils are equal, round, and reactive to light.  Cardiovascular:     Rate and Rhythm: Rhythm irregular.     Heart sounds: No murmur heard. No friction rub. No gallop.   Pulmonary:     Effort: Pulmonary effort is normal.     Breath sounds: Rales present. No wheezing.  Abdominal:     General: Abdomen is flat and scaphoid. Bowel sounds are normal.     Tenderness: There is abdominal tenderness in the  right upper quadrant. Positive signs include Murphy's sign.  Skin:    General: Skin is warm and dry.     Findings: No erythema.  Neurological:     General: No focal deficit present.     Mental Status: She is oriented to person, place, and time.     Cranial Nerves: No cranial nerve deficit.  Psychiatric:        Mood and Affect: Mood normal.        Behavior: Behavior normal.   2+ bilateral LE edema.  Assessment/Plan #1. Acute on chronic systolic congestive heart failure. Patient had a recent echocardiogram performed on 12/21 with a left ventricular ejection fraction 20 to 25%, also impaired right ventricular systolic function. Has evidence of volume overload including orthopnea and paroxysmal nocturnal dyspnea.  Weight gain.  Leg edema.  Elevated BNP, chest x-ray of chest congestion. She will be started on IV Lasix, also continued on beta-blocker. Consult cardiology. Patient does not have elevation of troponin.  2.  Atrial fibrillation with rapid ventricular response. Patient was chronically on anticoagulation, will continue. Patient heart rate is faster with exertion, continue beta-blocker.  #3.  Gallstones with cholecystitis. Patient does not have sepsis, no fever.  But patient has abdominal pain, ultrasonic evidence of cholecystitis.  General surgery consult obtained.  We  will continue antibiotic with Zosyn.  #4.  Dysphagia. Obtain speech therapy evaluation.  Patient has not had any aspiration pneumonia for the last 2 or 3 years.  5.  Severe protein calorie malnutrition. Patient appear malnourished, with muscle atrophy.  We will start supplement.  6.  Acute kidney injury. .  Previous lab, creatinine 0.74 on 01/03/2020, current creatinine 1.4.  Patient has acute kidney injury from congestive heart failure.  Continue to follow.  7.  Hyponatremia with hypervolemia. Continue IV Lasix.  8.  Liver function changes. Patient has elevation of AST ALT, also elevation of bilirubin 1.8, this is probably secondary to congestive heart failure.  Follow.  9.  CODE STATUS.  Patient wished to be full code.   Sharen Hones, MD 02/26/2020, 4:18 PM

## 2020-02-26 NOTE — ED Triage Notes (Signed)
Pt presents via POV c/o right sided abd pain. Reports has been having medical issues since October but feels she has gallstones however GI is unable to due surgery at this time due to ongoing medical issues.

## 2020-02-26 NOTE — Consult Note (Signed)
[facility] SURGICAL CONSULTATION NOTE (initial) - cpt: [id number])   HISTORY OF PRESENT ILLNESS (HPI):  74 y.o. female presented to [facility] ED today for evaluation of severe Rt abdominal pain. Patient reports having been evaluated twice in November by Dr.Jenkins at [facility] for gallstones, but due to recent hospitalization for Covid and extensive comorbidities along with her poor exercise intolerance/high respiratory risk was deferred awaiting optimization for elective cholecystectomy.   She now returns to [facility] ED w/ c/o Right sided pain and some shortness of breath.  She has known gallstones but reports that she has been told she is too weak for elective surgery.  She was tachycardic and tachypneic on arrival. She has A. fib with RVR.  Rate has been improved with diltiazem IV.  An u/s reports positive Murphy's sign with gallstones noted.  No GB wall thickening or pericholecystic fluid.  CBD of normal caliber. Elevated transaminases, mildly increased T.Bili and normal Alk Phos.  CBC results not noted.  She is anticoagulated on Xarelto.  Surgery is consulted by ER physician Dr. Cinda Quest in this context for evaluation and management of acute calculus cholecystitis. Advised to admit for Abx and surgery will observe and assist with treatment.   PAST MEDICAL HISTORY (PMH):  Past Medical History:  Diagnosis Date  . H/O blood clots   . Heart murmur   . Hypertension      PAST SURGICAL HISTORY ([place]):  Past Surgical History:  Procedure Laterality Date  . COLONOSCOPY WITH PROPOFOL N/A 09/26/2015   Procedure: COLONOSCOPY WITH PROPOFOL;  Surgeon: Lollie Sails, MD;  Location: [facility] ENDOSCOPY;  Service: Endoscopy;  Laterality: N/A;  . NO PAST SURGERIES       MEDICATIONS:  Prior to Admission medications   Medication Sig Start Date End Date Taking? Authorizing Provider  albuterol (PROVENTIL HFA;VENTOLIN HFA) 108 (90 Base) MCG/ACT inhaler Inhale 2 puffs into the lungs every 6 (six) hours as  needed for shortness of breath.    [provider]  Ascorbic Acid (VITAMIN C) 1000 MG tablet Take 1,000 mg by mouth daily.     [provider]  aspirin EC 81 MG tablet Take 81 mg by mouth daily. Swallow whole.    [provider]  calcium carbonate (OS-CAL - DOSED IN MG OF ELEMENTAL CALCIUM) 1250 (500 Ca) MG tablet Take 1 tablet by mouth daily.    [provider]  Cholecalciferol (VITAMIN D3 PO) Take 500 Units by mouth daily.    [provider]  clonazePAM (KLONOPIN) 0.5 MG tablet Take 0.5 mg by mouth daily as needed for anxiety.    [provider]  estradiol (ESTRACE) 0.5 MG tablet Take 0.5 mg by mouth daily.    [provider]  fluticasone furoate-vilanterol (BREO ELLIPTA) 100-25 MCG/INH AEPB Inhale 1 puff into the lungs daily. 08/17/18   Wilhelmina Mcardle, MD  metoprolol tartrate (LOPRESSOR) 25 MG tablet Take 1 tablet (25 mg total) by mouth 2 (two) times daily. 02/22/20 05/22/20  Kate Sable, MD  Omega-3 Fatty Acids (FISH OIL PO) Take 1,200 mg by mouth daily.     [provider]  ondansetron (ZOFRAN ODT) 4 MG disintegrating tablet Take 1 tablet (4 mg total) by mouth every 8 (eight) hours as needed for nausea. 12/26/19   Noemi Chapel, MD  potassium chloride SA (KLOR-CON) 20 MEQ tablet Take 1 tablet (20 mEq total) by mouth daily for 5 days. 12/26/19 02/01/20  Noemi Chapel, MD  quiNINE Orlin Hilding) 324 MG capsule Take 324 mg by  mouth daily at 8 pm. 03/23/19   [provider]  torsemide (DEMADEX) 20 MG tablet Take 1 tablet (20 mg total) by mouth once for 1 dose. 02/22/20 02/22/20  Kate Sable, MD  vitamin E 1000 UNIT capsule Take 1,000 Units by mouth daily.    [provider]  XARELTO 10 MG TABS tablet Take 2 tablets (20 mg total) by mouth daily. Patient taking differently: Take 10 mg by mouth daily. 01/02/20   Barton Dubois, MD     ALLERGIES:  Allergies  Allergen Reactions  . Pine Tar Cough  .  Sulfa Antibiotics Rash  . Tetracyclines & Related Rash     SOCIAL HISTORY:  Social History   Socioeconomic History  . Marital status: Widowed    Spouse name: Not on file  . Number of children: Not on file  . Years of education: Not on file  . Highest education level: Not on file  Occupational History  . Not on file  Tobacco Use  . Smoking status: Former Smoker    Packs/day: 3.00    Years: 30.00    Pack years: 90.00    Quit date: 02/03/1989    Years since quitting: 31.0  . Smokeless tobacco: Never Used  Vaping Use  . Vaping Use: Never used  Substance and Sexual Activity  . Alcohol use: Yes  . Drug use: No  . Sexual activity: Not on file  Other Topics Concern  . Not on file  Social History Narrative  . Not on file   Social Determinants of Health   Financial Resource Strain: Not on file  Food Insecurity: Not on file  Transportation Needs: Not on file  Physical Activity: Not on file  Stress: Not on file  Social Connections: Not on file  Intimate Partner Violence: Not on file     FAMILY HISTORY:  Family History  Problem Relation Age of Onset  . Varicose Veins Mother   . AAA (abdominal aortic aneurysm) Mother   . Heart attack Father   . Cancer Brother       REVIEW OF SYSTEMS:  Review of Systems  Constitutional: Negative for chills and fever.  HENT: Negative.   Eyes: Negative.   Respiratory: Positive for wheezing.   Cardiovascular: Negative.   Gastrointestinal: Positive for abdominal pain and heartburn.  Genitourinary: Negative.   Musculoskeletal: Positive for back pain.  Skin: Negative.   Neurological: Positive for sensory change.  Endo/Heme/Allergies: Bruises/bleeds easily.  Psychiatric/Behavioral: Negative.     VITAL SIGNS:  Temp:  [98.1 F (36.7 C)] 98.1 F (36.7 C) (12/25 1213) Pulse Rate:  [105-198] 118 (12/25 1315) Resp:  [16-32] 28 (12/25 1400) BP: (104-128)/(87-99) 119/94 (12/25 1400) SpO2:  [90 %-96 %] 96 % (12/25 1315)              INTAKE/OUTPUT:  No intake/output data recorded.  PHYSICAL EXAM:  Physical Exam Blood pressure (!) 119/94, pulse (!) 118, temperature 98.1 F (36.7 C), temperature source Oral, resp. rate (!) 28, SpO2 96 %.   CONSTITUTIONAL: Well developed, appropriately responsive and aware without distress.   EYES: Sclera non-icteric.   EARS, NOSE, MOUTH AND THROAT:  The oropharynx is clear. Oral mucosa is pink and moist.    Hearing is intact to voice.  NECK: Trachea is midline, and there is no jugular venous distension.  LYMPH NODES:  Lymph nodes in the neck are not enlarged. RESPIRATORY:  Lungs are clear, and breath sounds are equal bilaterally. Increased respiratory effort without pathologic use of  accessory muscles. CARDIOVASCULAR: Heart is tachy. GI: The abdomen is  soft, RUQ is tender with + Murphy's, she is nondistended. There were no palpable masses. MUSCULOSKELETAL:  Symmetrical muscle tone.    SKIN: Skin turgor is normal. No pathologic skin lesions appreciated. Some LE edema.  NEUROLOGIC:  Motor and sensation appear grossly normal.  Cranial nerves are grossly without defect. PSYCH:  Alert and oriented to person, place and time. Affect is appropriate for situation.  Data Reviewed I have personally reviewed what is currently available of the patient's imaging, recent labs and medical records.    Labs:  CBC Latest Ref Rng & Units 02/26/2020 01/03/2020 01/02/2020  WBC 4.0 - 10.5 K/uL 13.6(H) 21.2(H) 18.9(H)  Hemoglobin 12.0 - 15.0 g/dL 11.4(L) 12.1 12.5  Hematocrit 36.0 - 46.0 % 37.2 37.9 39.9  Platelets 150 - 400 K/uL 621(H) 442(H) 521(H)   CMP Latest Ref Rng & Units 02/26/2020 01/03/2020 01/02/2020  Glucose 70 - 99 mg/dL 118(H) 126(H) 166(H)  BUN 8 - 23 mg/dL 28(H) 27(H) 20  Creatinine 0.44 - 1.00 mg/dL 1.40(H) 0.74 0.70  Sodium 135 - 145 mmol/L 130(L) 135 138  Potassium 3.5 - 5.1 mmol/L 3.6 2.9(L) 4.3  Chloride 98 - 111 mmol/L 90(L) 101 102  CO2 22 - 32 mmol/L 26 24 25   Calcium 8.9  - 10.3 mg/dL 9.4 8.7(L) 9.1  Total Protein 6.5 - 8.1 g/dL 7.0 5.9(L) 6.3(L)  Total Bilirubin 0.3 - 1.2 mg/dL 1.8(H) 0.7 0.7  Alkaline Phos 38 - 126 U/L 95 71 71  AST 15 - 41 U/L 398(H) 32 28  ALT 0 - 44 U/L 276(H) 44 36     Imaging studies:   Last 24 hrs: DG Chest Portable 1 View  Result Date: 02/26/2020 CLINICAL DATA:  Weakness EXAM: PORTABLE CHEST 1 VIEW COMPARISON:  12/31/2019 FINDINGS: Stable heart size. Atherosclerotic calcification of the aortic knob. Low lung volumes. Mild interstitial prominence bilaterally. Small bilateral pleural effusions, left greater than right. Associated bibasilar opacities. Aeration of the mid to upper lung fields has improved compared to the previous radiograph. No pneumothorax. IMPRESSION: Small bilateral pleural effusions, left greater than right. Associated bibasilar opacities, may represent atelectasis and/or pneumonia. Electronically Signed   By: Davina Poke D.O.   On: 02/26/2020 12:59   US Abdomen Limited RUQ (LIVER/GB)  Result Date: 02/26/2020 CLINICAL DATA:  RIGHT upper quadrant pain. EXAM: ULTRASOUND ABDOMEN LIMITED RIGHT UPPER QUADRANT COMPARISON:  None. FINDINGS: Gallbladder: Multiple shadowing gallstones within lumen the gallbladder measuring size from 5-10 mm. Positive sonographic Murphy's sign. No pericholecystic fluid. Common bile duct: Diameter: Normal at 4 mm Liver: Mildly complex cyst in the RIGHT hepatic lobe corresponds to comparison CT. Liver parenchyma within normal limits in parenchymal echogenicity. Portal vein is patent on color Doppler imaging with normal direction of blood flow towards the liver. Other: Trace ascites.  RIGHT pleural effusion. IMPRESSION: 1. Multiple gallstones and positive sonographic Murphy's sign. Evaluate for acute cholecystitis. 2. Normal common bile duct. 3. RIGHT effusion and trace ascites. Electronically Signed   By: Suzy Bouchard M.D.   On: 02/26/2020 13:19     Assessment/Plan:  74 y.o. female with  gallstones, possible acute cholecystitis, complicated by pertinent comorbidities including:  Patient Active Problem List   Diagnosis Date Noted  . Acute on chronic diastolic (congestive) heart failure ([place]) 02/26/2020  . Essential hypertension 12/31/2019  . History of DVT of lower extremity 12/31/2019  . Pneumonia due to COVID-19 virus 12/31/2019  . Acute bronchitis 03/02/2018  . Varicose veins  of both lower extremities with inflammation 08/31/2017  . Chronic venous insufficiency 08/31/2017  . Swelling of limb 08/31/2017    - Hospitalist to admit and initiate IV abx    - Serial exams/Labs if no clear progress then may need to confirm cystic duct obstruction with HIDA  - may cool off acute cholecystitis with percutaneous cholecystostomy when at less risk from anticoagulation.   - NPO, hold Xarelto.   - SCD DVT prophylaxis  - Will follow with you.    Thank you for the opportunity to participate in this patient's care.   -- Ronny Bacon, M.D., FACS 02/26/2020, 4:12 PM

## 2020-02-26 NOTE — ED Notes (Signed)
Pt assisted to bsc. Pt placed back in bed.

## 2020-02-26 NOTE — ED Notes (Signed)
Pt requesting inhaler. Pharmacy called, will tube.

## 2020-02-26 NOTE — ED Notes (Signed)
Pt assisted to bedside commode. Pure wick placed in mesh underwear for lasix admin. Pt resting in bed. Pt dyspneic on exertion.

## 2020-02-27 DIAGNOSIS — Z01810 Encounter for preprocedural cardiovascular examination: Secondary | ICD-10-CM

## 2020-02-27 DIAGNOSIS — I5023 Acute on chronic systolic (congestive) heart failure: Secondary | ICD-10-CM | POA: Diagnosis not present

## 2020-02-27 DIAGNOSIS — I480 Paroxysmal atrial fibrillation: Secondary | ICD-10-CM

## 2020-02-27 DIAGNOSIS — E876 Hypokalemia: Secondary | ICD-10-CM | POA: Diagnosis not present

## 2020-02-27 DIAGNOSIS — K8000 Calculus of gallbladder with acute cholecystitis without obstruction: Secondary | ICD-10-CM | POA: Diagnosis not present

## 2020-02-27 LAB — BASIC METABOLIC PANEL
Anion gap: 14 (ref 5–15)
BUN: 31 mg/dL — ABNORMAL HIGH (ref 8–23)
CO2: 25 mmol/L (ref 22–32)
Calcium: 9 mg/dL (ref 8.9–10.3)
Chloride: 90 mmol/L — ABNORMAL LOW (ref 98–111)
Creatinine, Ser: 1.46 mg/dL — ABNORMAL HIGH (ref 0.44–1.00)
GFR, Estimated: 38 mL/min — ABNORMAL LOW (ref >60)
Glucose, Bld: 119 mg/dL — ABNORMAL HIGH (ref 70–99)
Potassium: 3.3 mmol/L — ABNORMAL LOW (ref 3.5–5.1)
Sodium: 129 mmol/L — ABNORMAL LOW (ref 135–145)

## 2020-02-27 LAB — MAGNESIUM: Magnesium: 2 mg/dL (ref 1.7–2.4)

## 2020-02-27 MED ORDER — APIXABAN 5 MG PO TABS
5.0000 mg | ORAL_TABLET | Freq: Two times a day (BID) | ORAL | Status: DC
  Administered 2020-02-27 (×2): 5 mg via ORAL
  Filled 2020-02-27 (×2): qty 1

## 2020-02-27 MED ORDER — ENSURE ENLIVE PO LIQD
237.0000 mL | Freq: Two times a day (BID) | ORAL | Status: DC
  Administered 2020-02-27 – 2020-03-02 (×5): 237 mL via ORAL

## 2020-02-27 MED ORDER — METOPROLOL TARTRATE 50 MG PO TABS
50.0000 mg | ORAL_TABLET | Freq: Two times a day (BID) | ORAL | Status: DC
  Administered 2020-02-27: 21:00:00 50 mg via ORAL
  Filled 2020-02-27: qty 1

## 2020-02-27 MED ORDER — LORAZEPAM 2 MG/ML IJ SOLN
0.5000 mg | Freq: Four times a day (QID) | INTRAMUSCULAR | Status: DC | PRN
  Administered 2020-02-27 (×2): 0.5 mg via INTRAVENOUS
  Filled 2020-02-27 (×2): qty 1

## 2020-02-27 MED ORDER — POTASSIUM CHLORIDE 20 MEQ PO PACK
40.0000 meq | PACK | Freq: Once | ORAL | Status: AC
  Administered 2020-02-27: 09:00:00 40 meq via ORAL
  Filled 2020-02-27: qty 2

## 2020-02-27 NOTE — Evaluation (Addendum)
Clinical/Bedside Swallow Evaluation Patient Details  Name: Paula Klein MRN: [id number] Date of Birth: 1945-11-13  Today's Date: 02/27/2020 Time: SLP Start Time (ACUTE ONLY): 0830 SLP Stop Time (ACUTE ONLY): 0930 SLP Time Calculation (min) (ACUTE ONLY): 60 min  Past Medical History:  Past Medical History:  Diagnosis Date  . H/O blood clots   . Heart murmur   . Hypertension    Past Surgical History:  Past Surgical History:  Procedure Laterality Date  . COLONOSCOPY WITH PROPOFOL N/A 09/26/2015   Procedure: COLONOSCOPY WITH PROPOFOL;  Surgeon: Lollie Sails, MD;  Location: [facility] ENDOSCOPY;  Service: Endoscopy;  Laterality: N/A;  . NO PAST SURGERIES     HPI:  Pt is a 74 year old female with history of DVT on chronic anticoagulation, Cardiac issues post Covid+, tobacco abuse, essential hypertension who came to the hospital with multiple complaints including short of breath, palpitation, abdominal pain.  Pt complains of difficulty breathing and swallowing when she Drops her Chin Down; she also endorsed issues of "dry heaving" at home priro to admission.  Pt appears easily SOB w/ WOB w/ any exertion including Talking.  Patient was previously healthy.  She developed Covid 2 months ago, was placed on 2 L of oxygen at nighttime.  Since that time, her condition has been deteriorating.  For the last 2 months, patient has not been able to walk due to leg weakness.  She also has frequent paroxysmal nocturnal dyspnea, insomnia.  She has been sleeping in a recliner for the last 2 months.   Assessment / Plan / Recommendation Clinical Impression  Pt appears to present w/ adequate oropharyngeal phase swallow w/ No oropharyngeal phase dysphagia noted, No neuromuscular deficits noted. Pt complains of difficulty breathing and swallowing when she "Drops her Chin Down" -- "I cannot swallow like this"; pt was encouraged NOT TO DROP HER CHIN DOWN when eating/drinking as she does NOT need any swallowing  strategies. Pt was educated on the fact that when anyone Drops their Chin, they are RESTRICTING their Airway and this can Impact Breathing/swallowing. Pt also endorsed issues of "dry heaving" at home priro to admission -- unsure if any Reflux issues baseline. Pt appears easily SOB w/ WOB w/ any exertion including Talking so Rest Breaks were recommended often during eating/drinking for Conservation of Energy -- suspect this is related to her recent Covid+ illness. Pt consumed po trials w/ No overt, clinical s/s of aspiration during po trials. She is swallowing all pills w/ water w/ NSG w/out issue. Pt appears at reduced risk for aspiration following general aspiration precautions -- sitting in a chair for meals is best for support. During po trials, pt consumed all consistencies w/ no overt coughing, decline in vocal quality, or change in respiratory presentation during/post trials. Oral phase appeared [facility] w/ timely bolus management, mastication, and control of bolus propulsion for A-P transfer for swallowing. Oral clearing achieved w/ all trial consistencies. OM Exam appeared [facility] w/ no unilateral weakness noted. Native Dentition. Speech Clear. Pt fed self w/ setup support. Recommend continue a Regular consistency diet w/ well-Cut Meats, moistened foods; Thin liquids VIA CUP - less straw use for easier breath control when drinking. Recommend general aspiration precautions, Pills WHOLE in Puree IF needed for easier swallowing. Education given on Pills in Puree; food consistencies and easy to eat options; general aspiration precautions; and ways to promote conservation of energy at meals including taking Rest Breaks. NSG to reconsult if any new needs arise. NSG agreed. SLP Visit Diagnosis: Dysphagia, unspecified (  R13.10)    Aspiration Risk   (reduced following precautions)    Diet Recommendation  Regular diet (cut meats, moist foods) w/ thin liquids VIA CUP; general aspiration precautions; Rest Breaks during  meals for conservation of Energy(lessen SOB/WOB at meals). Less Talking at meals.  Medication Administration: Whole meds with liquid (or whole in Puree)    Other  Recommendations Recommended Consults:  (PT/OT; dietician f/u) Oral Care Recommendations: Oral care BID;Patient independent with oral care Other Recommendations:  (n/a)   Follow up Recommendations None      Frequency and Duration  (n/a)   (n/a)       Prognosis Prognosis for Safe Diet Advancement: Good Barriers to Reach Goals:  (none)      Swallow Study   General Date of Onset: 02/26/20 HPI: Pt is a 74 year old female with history of DVT on chronic anticoagulation, essential hypertension who came to the hospital with multiple complaints including short of breath, palpitation, abdominal pain.  Pt complains of difficulty breathing and swallowing when she Drops her Chin Down; she also endorsed issues of "dry heaving" at home priro to admission.  Pt appears easily SOB w/ WOB w/ any exertion including Talking.  Patient was previously healthy.  She developed Covid 2 months ago, was placed on 2 L of oxygen at nighttime.  Since that time, her condition has been deteriorating.  For the last 2 months, patient has not been able to walk due to leg weakness.  She also has frequent paroxysmal nocturnal dyspnea, insomnia.  She has been sleeping in a recliner for the last 2 months. Type of Study: Bedside Swallow Evaluation Previous Swallow Assessment: none Diet Prior to this Study: Regular;Thin liquids Temperature Spikes Noted: No (13.6 wbc) Respiratory Status: Nasal cannula (2L) History of Recent Intubation: No Behavior/Cognition: Alert;Cooperative;Pleasant mood (often chuckles during conversation; SOB) Oral Cavity Assessment: Within Functional Limits Oral Care Completed by SLP: Yes Oral Cavity - Dentition: Adequate natural dentition Vision: Functional for self-feeding Self-Feeding Abilities: Able to feed self;Needs set up Patient  Positioning: Upright in bed (needed positioning in bed for supported upright) Baseline Vocal Quality: Normal (w/ min SOB/WOB when exerting self) Volitional Cough: Strong Volitional Swallow: Able to elicit    Oral/Motor/Sensory Function Overall Oral Motor/Sensory Function: Within functional limits   Ice Chips Ice chips: Not tested   Thin Liquid Thin Liquid: Within functional limits Presentation: Cup;Self Fed;Straw (using upon entering; several ozs during meal/eval(more Cup))    Nectar Thick Nectar Thick Liquid: Not tested   Honey Thick Honey Thick Liquid: Not tested   Puree Puree: Within functional limits Presentation: Self Fed;Spoon (~3+ ozs)   Solid     Solid: Within functional limits (grossly) Presentation: Self Fed;Spoon (4 trials) Other Comments: "I am not much of a breakfast eater"        Orinda Kenner, [place], [place] Speech Language Pathologist Rehab Services [phone] Paula Klein 02/27/2020,10:26 AM

## 2020-02-27 NOTE — Progress Notes (Signed)
PT Cancellation Note  Patient Details Name: Paula Klein MRN: [id number] DOB: January 08, 1946   Cancelled Treatment:    Reason Eval/Treat Not Completed: Patient declined, no reason specified;Other (comment) (Patient consult received and reviewed. Upon entering room and introducing self at PT patient burst into tears. Fiance explained that patient just had anxiety meds, should come back later. Will attempt again at later time/date.)  Janna Arch, PT, DPT   02/27/2020, 3:04 PM

## 2020-02-27 NOTE — Progress Notes (Signed)
[facility] Day(s): 1.   Post op day(s):  Marland Kitchen   Interval History: Patient seen and examined, no acute events or new complaints overnight. Patient reports feeling well. She reports abdominal pain has improved.  Pain is just upon deep palpation in the right upper quadrant.  Denies any fever or chills.  Denies nausea or vomiting.  She reported that she tolerated scrambled egg.  Vital signs in last 24 hours: [min-max] current  Temp:  [97.7 F (36.5 C)-98.1 F (36.7 C)] 97.7 F (36.5 C) (12/26 0743) Pulse Rate:  [44-198] 99 (12/26 0743) Resp:  [14-32] 16 (12/26 0251) BP: (96-132)/(70-99) 109/85 (12/26 0743) SpO2:  [90 %-100 %] 93 % (12/26 0743) Weight:  [71.7 kg] 71.7 kg (12/26 0251)     Height: 5\' 1"  (154.9 cm) Weight: 71.7 kg BMI (Calculated): 29.87   Physical Exam:  Constitutional: alert, cooperative and no distress  Respiratory: breathing non-labored at rest  Cardiovascular: regular rate and sinus rhythm  Gastrointestinal: soft, mild-tender in the right upper quadrant, and non-distended  Labs:  CBC Latest Ref Rng & Units 02/26/2020 01/03/2020 01/02/2020  WBC 4.0 - 10.5 K/uL 13.6(H) 21.2(H) 18.9(H)  Hemoglobin 12.0 - 15.0 g/dL 11.4(L) 12.1 12.5  Hematocrit 36.0 - 46.0 % 37.2 37.9 39.9  Platelets 150 - 400 K/uL 621(H) 442(H) 521(H)   CMP Latest Ref Rng & Units 02/27/2020 02/26/2020 01/03/2020  Glucose 70 - 99 mg/dL 119(H) 118(H) 126(H)  BUN 8 - 23 mg/dL 31(H) 28(H) 27(H)  Creatinine 0.44 - 1.00 mg/dL 1.46(H) 1.40(H) 0.74  Sodium 135 - 145 mmol/L 129(L) 130(L) 135  Potassium 3.5 - 5.1 mmol/L 3.3(L) 3.6 2.9(L)  Chloride 98 - 111 mmol/L 90(L) 90(L) 101  CO2 22 - 32 mmol/L 25 26 24   Calcium 8.9 - 10.3 mg/dL 9.0 9.4 8.7(L)  Total Protein 6.5 - 8.1 g/dL - 7.0 5.9(L)  Total Bilirubin 0.3 - 1.2 mg/dL - 1.8(H) 0.7  Alkaline Phos 38 - 126 U/L - 95 71  AST 15 - 41 U/L - 398(H) 32  ALT 0 - 44 U/L - 276(H) 44    Imaging studies: No new pertinent imaging  studies   Assessment/Plan:  74 y.o. female with acute cholecystitis, complicated by pertinent comorbidities including DVT on anticoagulation, hypertension, recent Covid infection.  Patient with acute cholecystitis responding to IV antibiotic therapy.  The pain has improved with current antibiotic therapy.  Patient is tolerating diet without exacerbation of pain.  I agreed to continue antibiotic therapy.  Percutaneous cholecystostomy can be on hold if patient continue to respond to IV antibiotic therapy.  If patient starts developing abdominal pain diet should be decreased but if she continues tolerating the regular diet she can continue on it.  We will continue to follow with physical exam.  I agree to follow-up with labs tomorrow.   Arnold Long, MD

## 2020-02-27 NOTE — Evaluation (Signed)
Occupational Therapy Evaluation Patient Details Name: Paula Klein MRN: [id number] DOB: Feb 25, 1946 Today's Date: 02/27/2020    History of Present Illness Paula Klein is a 74 year old female with history of DVT on chronic anticoagulation, essential hypertension who came to the hospital with multiple complaints including short of breath, palpitation, abdominal pain and dysphagia.   Clinical Impression   Paula Klein was seen for OT evaluation this date. Prior to hospital admission, Paula Klein was MOD I using RW, reports intermittently requiring fiancee to push her in transport chair. Paula Klein lives alone c fiancee staying ~4 days/week and brother next door - could provide 24/7 if needed. Paula Klein presents to acute OT demonstrating impaired ADL performance and functional mobility 2/2 decreased activity tolerance functional balance deficits.   Paula Klein currently requires SBA + RW hand washing, toilet t/f, preihygiene, and clothing mgmt in standing - assist for lines/leads mgmt. SUPERVISION for LB access seated EOB. SpO2 88-92% on 2L [place] during mobility. SpO2 86-88% on RA at rest (Paula Klein removed), resolved to 92% on 2L. Paula Klein would benefit from skilled OT to address noted impairments and functional limitations (see below for any additional details) in order to maximize safety and independence while minimizing falls risk and caregiver burden. Upon hospital discharge, anticipate no OT follow up.     Follow Up Recommendations  No OT follow up;Supervision/Assistance - 24 hour    Equipment Recommendations  None recommended by OT    Recommendations for Other Services       Precautions / Restrictions Precautions Precautions: Fall Restrictions Weight Bearing Restrictions: No      Mobility Bed Mobility Overal bed mobility: Independent                  Transfers Overall transfer level: Needs assistance Equipment used: Rolling walker (2 wheeled) Transfers: Sit to/from [facility] Sit to Stand:  Supervision Stand pivot transfers: Supervision       General transfer comment: SBA + RW sit<>stand, SBA + HHA for SPT    Balance Overall balance assessment: Needs assistance Sitting-balance support: No upper extremity supported;Feet supported Sitting balance-Leahy Scale: Good     Standing balance support: No upper extremity supported;During functional activity Standing balance-Leahy Scale: Fair                             ADL either performed or assessed with clinical judgement   ADL Overall ADL's : Needs assistance/impaired                                       General ADL Comments: SBA + RW hand washing, toilet t/f, preihygiene, and clothing mgmt in standing - assist for lines/leads mgmt. SUPERVISINO for LB access seated EOB.                  Pertinent Vitals/Pain Pain Assessment: Faces Faces Pain Scale: Hurts a little bit Pain Location: abdomen Pain Descriptors / Indicators: Discomfort;Dull Pain Intervention(s): Limited activity within patient's tolerance;Repositioned     Hand Dominance     Extremity/Trunk Assessment Upper Extremity Assessment Upper Extremity Assessment: Overall WFL for tasks assessed   Lower Extremity Assessment Lower Extremity Assessment: Generalized weakness       Communication Communication Communication: No difficulties   Cognition Arousal/Alertness: Awake/alert Behavior During Therapy: WFL for tasks assessed/performed Overall Cognitive Status: Within Functional Limits for tasks assessed  General Comments  SpO2 88-92% on 2L [place] during mobility. SpO2 86-88% on RA at rest (Paula Klein removed), resolved to 92% on 2L    Exercises Exercises: Other exercises Other Exercises Other Exercises: Paula Klein and family educated re: OT role, DME recs, d/c recs, falls prevention, ECS, home/routines modifications, HEP Other Exercises: LBD, toileting, hand washing, sup<>sit x2,  sit<>stand x2, SPT, sitting/standing balance/tolerance   Shoulder Instructions      Home Living Family/patient expects to be discharged to:: Private residence Living Arrangements: Spouse/significant other Available Help at Discharge: Family;Available 24 hours/day Type of Home: House Home Access: Ramped entrance     Home Layout: One level     Bathroom Shower/Tub: Astronomer Accessibility: Yes How Accessible: Accessible via walker Home Equipment: [place] - 2 wheels;Shower seat;Transport chair   Additional Comments: reports fiancee and brother can provide near 24/7 if needed      Prior Functioning/Environment Level of Independence: Independent with assistive device(s)        Comments: RW for household mobility, recently using transport chair when feeling weak (last ~month)        OT Problem List: Decreased activity tolerance;Impaired balance (sitting and/or standing);Cardiopulmonary status limiting activity      OT Treatment/Interventions: Self-care/ADL training;Therapeutic exercise;DME and/or AE instruction;Energy conservation;Therapeutic activities;Patient/family education;Balance training    OT Goals(Current goals can be found in the care plan section) Acute Rehab OT Goals Patient Stated Goal: To return home OT Goal Formulation: With patient/family Time For Goal Achievement: 03/12/20 Potential to Achieve Goals: Good ADL Goals Paula Klein Will Perform Grooming: Independently;standing Paula Klein Will Transfer to Toilet: Independently;ambulating;regular height toilet Additional ADL Goal #1: Paula Klein will Independently verbalize plan to implement x3 ECS  OT Frequency: Min 1X/week    AM-PAC OT "6 Clicks" Daily Activity     Outcome Measure Help from another person eating meals?: None Help from another person taking care of personal grooming?: None Help from another person toileting, which includes using toliet, bedpan, or urinal?: A Little Help from another person bathing  (including washing, rinsing, drying)?: A Little Help from another person to put on and taking off regular upper body clothing?: None Help from another person to put on and taking off regular lower body clothing?: None 6 Click Score: 22   End of Session Equipment Utilized During Treatment: Rolling walker;Gait belt Nurse Communication: Mobility status  Activity Tolerance: Patient tolerated treatment well Patient left: in chair;with call bell/phone within reach;with chair alarm set;with family/visitor present  OT Visit Diagnosis: Other abnormalities of gait and mobility (R26.89)                Time: 1017-5102 OT Time Calculation (min): 29 min Charges:  OT General Charges $OT Visit: 1 Visit OT Evaluation $OT Eval Moderate Complexity: 1 Mod OT Treatments $Self Care/Home Management : 23-37 mins   Dessie Coma, M.S. OTR/L  02/27/20, 10:51 AM  ascom [phone]

## 2020-02-27 NOTE — Progress Notes (Signed)
PROGRESS NOTE    Paula Klein  GDJ:[id number] DOB: Jul 04, 1945 DOA: 02/26/2020 PCP: Marguerita Merles, MD   Chief complaint.  Shortness of breath. Brief Narrative:  Paula Klein is a 74 year old female with history of DVT on chronic anticoagulation, essential hypertension who came to the hospital with multiple complaints including short of breath, palpitation, abdominal pain and dysphagia. Upon arriving the hospital, her creatinine was 1.4, sodium 130.  AST 398, ALT 276, total protein 1.8, BNP 1132, procalcitonin level 0.12, chest x-ray showed bilateral lower lobe infiltrates, small pleural effusion.  Right upper quadrant ultrasound showed cholecystitis with positive Murphy sign. Telemetry showed atrial fibrillation with rapid ventricular response.  Troponin 15. Patient is started on IV Lasix for acute exacerbation of congestive heart failure, her most recent echocardiogram was performed last month with ejection fraction 20 to 25%. Patient is also started on Zosyn for acute cholecystitis with gallstone, seen by general surgery.  Assessment & Plan:   Principal Problem:   Acute on chronic systolic CHF (congestive heart failure) (HCC) Active Problems:   History of DVT of lower extremity   AF (paroxysmal atrial fibrillation) (HCC)   Cholecystitis with cholelithiasis   Dysphagia   Severe protein-calorie malnutrition (HCC)   AKI (acute kidney injury) ([place])   Hyponatremia   Elevated liver enzymes  #1.  Acute on chronic systolic congestive heart failure. New onset atrial fibrillation with rapid ventricle response. Appreciate cardiology consult. Continue IV Lasix. Patient is also on Cardizem drip for atrial fibrillation. Switch anticoagulation to Eliquis due to worsening renal function. Patient has significant anxiety, start as needed Ativan 0.5 mg as needed. Fluid restriction. Patient condition is quite serious, significant risk of being deteriorating. Discussed with patient and the  family again, confirmed patient still full code.  #2.  Cholecystitis with cholelithiasis. Patient has been seen by general surgery.  Continue Zosyn for now. General surgery will consider gallbladder drain.  #3.  Dysphagia. Seen by speech therapy, no pharyngeal phase of dysphagia.  No risk for aspiration.  #4.  Severe protein calorie malnutrition. Supplement.  #5.  Acute kidney injury. Continue diuretics.  6.  Liver function changes. Secondary to CHF.  Follow.  #7.  Hypokalemia. Supplement.  DVT prophylaxis: Eliquis Code Status: Full Family Communication: Husband at the bedside Disposition Plan:  .   Status is: Inpatient  Remains inpatient appropriate because:Inpatient level of care appropriate due to severity of illness   Dispo: The patient is from: Home              Anticipated d/c is to: Home              Anticipated d/c date is: > 3 days              Patient currently is not medically stable to d/c.        I/O last 3 completed shifts: In: 128.7 [I.V.:96; IV Piggyback:32.7] Out: -  No intake/output data recorded.     Consultants:   General surgery and cardiology.  Procedures: None  Antimicrobials:  Zosyn  Subjective: Patient feels very anxious, her oxygenation is stable. Short of breath with minimal exertion, cough without any production. No fever or chills. No dysuria hematuria pain No headache or dizziness.   Objective: Vitals:   02/27/20 0130 02/27/20 0200 02/27/20 0251 02/27/20 0743  BP: 100/70 110/79 108/73 109/85  Pulse: (!) 104 93 88 99  Resp: (!) 24 19 16    Temp:   98.1 F (36.7 C) 97.7 F (36.5 C)  TempSrc:   Oral Oral  SpO2: 92% 93% 92% 93%  Weight:   71.7 kg   Height:   5\' 1"  (1.549 m)     Intake/Output Summary (Last 24 hours) at 02/27/2020 1309 Last data filed at 02/27/2020 0348 Gross per 24 hour  Intake 128.66 ml  Output --  Net 128.66 ml   Filed Weights   02/27/20 0251  Weight: 71.7 kg     Examination:  General exam: Appears calm and comfortable  Respiratory system: Clear to auscultation. Respiratory effort normal. Cardiovascular system: Irregularly irregular.  No JVD, murmurs, rubs, gallops or clicks.  Gastrointestinal system: Abdomen is nondistended, soft and nontender. No organomegaly or masses felt. Normal bowel sounds heard. Central nervous system: Alert and oriented. No focal neurological deficits. Extremities: 2+ edema Skin: No rashes, lesions or ulcers Psychiatry:  Mood & affect appropriate.     Data Reviewed: I have personally reviewed following labs and imaging studies  CBC: Recent Labs  Lab 02/26/20 1552  WBC 13.6*  NEUTROABS 9.5*  HGB 11.4*  HCT 37.2  MCV 78.8*  PLT [id number]*   Basic Metabolic Panel: Recent Labs  Lab 02/26/20 1234 02/27/20 0344  NA 130* 129*  K 3.6 3.3*  CL 90* 90*  CO2 26 25  GLUCOSE 118* 119*  BUN 28* 31*  CREATININE 1.40* 1.46*  CALCIUM 9.4 9.0  MG  --  2.0   GFR: Estimated Creatinine Clearance: 30.6 mL/min (A) (by C-G formula based on SCr of 1.46 mg/dL (H)). Liver Function Tests: Recent Labs  Lab 02/26/20 1234  AST 398*  ALT 276*  ALKPHOS 95  BILITOT 1.8*  PROT 7.0  ALBUMIN 3.4*   Recent Labs  Lab 02/26/20 1234  LIPASE 26   No results for input(s): AMMONIA in the last 168 hours. Coagulation Profile: No results for input(s): INR, PROTIME in the last 168 hours. Cardiac Enzymes: No results for input(s): CKTOTAL, CKMB, CKMBINDEX, TROPONINI in the last 168 hours. BNP (last 3 results) No results for input(s): PROBNP in the last 8760 hours. HbA1C: No results for input(s): HGBA1C in the last 72 hours. CBG: No results for input(s): GLUCAP in the last 168 hours. Lipid Profile: No results for input(s): CHOL, HDL, LDLCALC, TRIG, CHOLHDL, LDLDIRECT in the last 72 hours. Thyroid Function Tests: No results for input(s): TSH, T4TOTAL, FREET4, T3FREE, THYROIDAB in the last 72 hours. Anemia Panel: No results for  input(s): VITAMINB12, FOLATE, FERRITIN, TIBC, IRON, RETICCTPCT in the last 72 hours. Sepsis Labs: Recent Labs  Lab 02/26/20 1234  PROCALCITON 0.12    Recent Results (from the past 240 hour(s))  Resp Panel by RT-PCR (Flu A&B, Covid) Nasopharyngeal Swab     Status: None   Collection Time: 02/26/20 12:43 PM   Specimen: Nasopharyngeal Swab; Nasopharyngeal(NP) swabs in vial transport medium  Result Value Ref Range Status   SARS Coronavirus 2 by RT PCR NEGATIVE NEGATIVE Final    Comment: (NOTE) SARS-CoV-2 target nucleic acids are NOT DETECTED.  The SARS-CoV-2 RNA is generally detectable in upper respiratory specimens during the acute phase of infection. The lowest concentration of SARS-CoV-2 viral copies this assay can detect is 138 copies/mL. A negative result does not preclude SARS-Cov-2 infection and should not be used as the sole basis for treatment or other patient management decisions. A negative result may occur with  improper specimen collection/handling, submission of specimen other than nasopharyngeal swab, presence of viral mutation(s) within the areas targeted by this assay, and inadequate number of viral copies(<138 copies/mL). A negative  result must be combined with clinical observations, patient history, and epidemiological information. The expected result is Negative.  Fact Sheet for Patients:  EntrepreneurPulse.com.au  Fact Sheet for Healthcare Providers:  IncredibleEmployment.be  This test is no t yet approved or cleared by the [place] FDA and  has been authorized for detection and/or diagnosis of SARS-CoV-2 by FDA under an Emergency Use Authorization (EUA). This EUA will remain  in effect (meaning this test can be used) for the duration of the COVID-19 declaration under Section 564(b)(1) of the Act, 21 U.S.C.section 360bbb-3(b)(1), unless the authorization is terminated  or revoked sooner.       Influenza A by PCR  NEGATIVE NEGATIVE Final   Influenza B by PCR NEGATIVE NEGATIVE Final    Comment: (NOTE) The Xpert Xpress SARS-CoV-2/FLU/RSV plus assay is intended as an aid in the diagnosis of influenza from Nasopharyngeal swab specimens and should not be used as a sole basis for treatment. Nasal washings and aspirates are unacceptable for Xpert Xpress SARS-CoV-2/FLU/RSV testing.  Fact Sheet for Patients: EntrepreneurPulse.com.au  Fact Sheet for Healthcare Providers: IncredibleEmployment.be  This test is not yet approved or cleared by the [place] FDA and has been authorized for detection and/or diagnosis of SARS-CoV-2 by FDA under an Emergency Use Authorization (EUA). This EUA will remain in effect (meaning this test can be used) for the duration of the COVID-19 declaration under Section 564(b)(1) of the Act, 21 U.S.C. section 360bbb-3(b)(1), unless the authorization is terminated or revoked.  Performed at [facility], [address] [place], [place] [address]          Radiology Studies: DG Chest Portable 1 View  Result Date: 02/26/2020 CLINICAL DATA:  Weakness EXAM: PORTABLE CHEST 1 VIEW COMPARISON:  12/31/2019 FINDINGS: Stable heart size. Atherosclerotic calcification of the aortic knob. Low lung volumes. Mild interstitial prominence bilaterally. Small bilateral pleural effusions, left greater than right. Associated bibasilar opacities. Aeration of the mid to upper lung fields has improved compared to the previous radiograph. No pneumothorax. IMPRESSION: Small bilateral pleural effusions, left greater than right. Associated bibasilar opacities, may represent atelectasis and/or pneumonia. Electronically Signed   By: Davina Poke D.O.   On: 02/26/2020 12:59   US Abdomen Limited RUQ (LIVER/GB)  Result Date: 02/26/2020 CLINICAL DATA:  RIGHT upper quadrant pain. EXAM: ULTRASOUND ABDOMEN LIMITED RIGHT UPPER QUADRANT COMPARISON:  None.  FINDINGS: Gallbladder: Multiple shadowing gallstones within lumen the gallbladder measuring size from 5-10 mm. Positive sonographic Murphy's sign. No pericholecystic fluid. Common bile duct: Diameter: Normal at 4 mm Liver: Mildly complex cyst in the RIGHT hepatic lobe corresponds to comparison CT. Liver parenchyma within normal limits in parenchymal echogenicity. Portal vein is patent on color Doppler imaging with normal direction of blood flow towards the liver. Other: Trace ascites.  RIGHT pleural effusion. IMPRESSION: 1. Multiple gallstones and positive sonographic Murphy's sign. Evaluate for acute cholecystitis. 2. Normal common bile duct. 3. RIGHT effusion and trace ascites. Electronically Signed   By: Suzy Bouchard M.D.   On: 02/26/2020 13:19        Scheduled Meds: . aspirin EC  81 mg Oral Daily  . estradiol  0.5 mg Oral Daily  . fluticasone furoate-vilanterol  1 puff Inhalation Daily  . furosemide  40 mg Intravenous Q12H  . metoprolol tartrate  25 mg Oral BID  . rivaroxaban  10 mg Oral Q supper  . sodium chloride flush  3 mL Intravenous Q12H   Continuous Infusions: . sodium chloride    . diltiazem (CARDIZEM)  infusion 15 mg/hr (02/27/20 0648)  . piperacillin-tazobactam (ZOSYN)  IV 3.375 g (02/27/20 0644)     LOS: 1 day    Time spent: 35 minutes    Sharen Hones, MD [facility]   To contact the attending provider between 7A-7P or the covering provider during after hours 7P-7A, please log into the web site [email or web address] and access using universal [place] password for that web site. If you do not have the password, please call the hospital operator.  02/27/2020, 1:09 PM

## 2020-02-27 NOTE — TOC Initial Note (Signed)
Transition of Care [facility]) - Initial/Assessment Note    Patient Details  Name: Paula Klein MRN: [id number] Date of Birth: [birth year]  Transition of Care [facility]) CM/SW Contact:    Harriet Masson, RN Phone Number: [phone] 02/27/2020, 12:21 PM  Clinical Narrative:                 [facility] RN spoke with pt today and confirmed supportive family along with her fiance Shanon Brow). Pt lives in a mobile home with sufficient transportation to all medical appointments and able to afford her medications with no anticipated problems or delays. Pt has a shower chair and old walker requesting a new one however pending PT evaluation. Encouraged pt to allow PT to evaluate for recommended on the type of assistive device (receptive). Pt states she lives [place] however home address indicates [place] in [place]. No other needs presented at this time.  TOC team will continue to follow up accordingly.  Expected Discharge Plan: Home/Self Care (OT no f/u PT pdg.) Barriers to Discharge: Continued Medical Work up   Patient Goals and CMS Choice        Expected Discharge Plan and Services Expected Discharge Plan: Home/Self Care (OT no f/u PT pdg.)       Living arrangements for the past 2 months: Mobile Home                                      Prior Living Arrangements/Services Living arrangements for the past 2 months: Mobile Home Lives with:: Self Patient language and need for interpreter reviewed:: Yes        Need for Family Participation in Patient Care: Yes (Comment) Care giver support system in place?: Yes (comment)   Criminal Activity/Legal Involvement Pertinent to Current Situation/Hospitalization: No - Comment as needed  Activities of Daily Living Home Assistive Devices/Equipment: None ADL Screening (condition at time of admission) Patient's cognitive ability adequate to safely complete daily activities?: Yes Is the patient deaf or have difficulty hearing?: No Does the  patient have difficulty seeing, even when wearing glasses/contacts?: No Does the patient have difficulty concentrating, remembering, or making decisions?: No Patient able to express need for assistance with ADLs?: Yes Does the patient have difficulty dressing or bathing?: Yes Independently performs ADLs?: No Communication: Independent Does the patient have difficulty walking or climbing stairs?: Yes Weakness of Legs: Both Weakness of Arms/Hands: None  Permission Sought/Granted   Permission granted to share information with : Yes, Verbal Permission Granted              Emotional Assessment Appearance:: Appears stated age Attitude/Demeanor/Rapport: Engaged Affect (typically observed): Accepting Orientation: : Oriented to Self,Oriented to Place,Oriented to  Time,Oriented to Situation Alcohol / Substance Use: Not Applicable Psych Involvement: No (comment)  Admission diagnosis:  Cholecystitis [K81.9] RUQ pain [R10.11] Right upper quadrant abdominal pain [R10.11] Acute on chronic diastolic (congestive) heart failure (HCC) [W54.62] Combined systolic and diastolic congestive heart failure, unspecified HF chronicity ([place]) [I50.40] Patient Active Problem List   Diagnosis Date Noted  . Acute on chronic diastolic (congestive) heart failure ([place]) 02/26/2020  . Acute on chronic systolic CHF (congestive heart failure) ([place]) 02/26/2020  . AF (paroxysmal atrial fibrillation) ([place]) 02/26/2020  . Cholecystitis with cholelithiasis 02/26/2020  . Dysphagia 02/26/2020  . Severe protein-calorie malnutrition ([place]) 02/26/2020  . AKI (acute kidney injury) ([place]) 02/26/2020  . Hyponatremia 02/26/2020  . Elevated liver enzymes 02/26/2020  . Essential  hypertension 12/31/2019  . History of DVT of lower extremity 12/31/2019  . Pneumonia due to COVID-19 virus 12/31/2019  . Acute bronchitis 03/02/2018  . Varicose veins of both lower extremities with inflammation 08/31/2017  . Chronic venous insufficiency  08/31/2017  . Swelling of limb 08/31/2017   PCP:  Leanna Sato, MD Pharmacy:   [facility] [place] [place] - [address] [place] [address] Phone: [phone] Fax: [phone]     Social Determinants of Health (SDOH) Interventions    Readmission Risk Interventions No flowsheet data found.

## 2020-02-27 NOTE — Consult Note (Addendum)
[place] for Eliquis  Indication: atrial fibrillation  Allergies  Allergen Reactions  . [place] Cough  . Sulfa Antibiotics Rash  . Tetracyclines & Related Rash    Patient Measurements: Height: 5\' 1"  (154.9 cm) Weight: 71.7 kg (158 lb) IBW/kg (Calculated) : 47.8 Heparin Dosing Weight: 63.3 kg   Vital Signs: Temp: 97.7 F (36.5 C) (12/26 0743) Temp Source: Oral (12/26 0743) BP: 109/85 (12/26 0743) Pulse Rate: 99 (12/26 0743)  Labs: Recent Labs    02/26/20 1234 02/26/20 1552 02/27/20 0344  HGB  --  11.4*  --   HCT  --  37.2  --   PLT  --  621*  --   CREATININE 1.40*  --  1.46*  TROPONINIHS 15 13  --     Estimated Creatinine Clearance: 30.6 mL/min (A) (by C-G formula based on SCr of 1.46 mg/dL (H)).   Medical History: Past Medical History:  Diagnosis Date  . H/O blood clots   . Heart murmur   . Hypertension     Medications:  Xarelto 10 mg daily (prescription written for 20 mg) - last dose 02/25/2020  Assessment: Pharmacy has been consulted for Eliquis dosing for atrial fibrillation. Per notes, patient has a PMH significant for DVT and presented to the ED with c/o SOB, palpitation, abdominal pain and dysphagia. Baseline hemoglobin and platelets are WNL.    Goal of Therapy:   Monitor platelets by anticoagulation protocol: Yes   Plan:  Will order Eliquis 5 mg BID.   Rowland Lathe 02/27/2020,1:31 PM

## 2020-02-27 NOTE — Consult Note (Signed)
Cardiology Consultation:   Patient ID: Paula Klein MRN: [id number]; DOB: [birth year]  Admit date: 02/26/2020 Date of Consult: 02/27/2020  Primary Care Provider: Marguerita Merles, MD [facility] Cardiologist: Kate Sable, MD  [facility] Electrophysiologist:  None    Patient Profile:   Paula Klein is a 74 y.o. female with a hx of recent Covid infection, history of DVT who is being seen today for the evaluation of acute cholecystitis in the setting of  congestive heart failure at the request of [place].  History of Present Illness:   Paula Klein is a 74 year old female who developed Covid 2 months ago.  She has been on nighttime oxygen since that time.  Since her Covid infection, her condition has been gradually deteriorating.  She is not been able to walk due to leg weakness.  She has been sleeping in the recliner for the past 2 months.  She has been complaining of right upper quadrant pain.  She has been diagnosed with gallstones but was not a candidate for surgery due to her multiple medical issues.  She has been seen recently by Dr.Agbor-Etang. Echo from 02/22/20 shows markedly reduced left ventricular systolic function with an ejection fraction of 20 to 25%.  She was in atrial fibrillation.  She has moderate mitral regurgitation. She had a moderate size left pleural effusion.  She is on Lasix 40 mg IV every 12 hours. I do not think that the ins and outs are recorded accurately. She is on Cardizem drip and her atrial fibrillation rate is 114.  She has DOE walking from the bathroom to her bed . No angina CP  She is a former heavy smoker - 3 ppd   Past Medical History:  Diagnosis Date  . H/O blood clots   . Heart murmur   . Hypertension     Past Surgical History:  Procedure Laterality Date  . COLONOSCOPY WITH PROPOFOL N/A 09/26/2015   Procedure: COLONOSCOPY WITH PROPOFOL;  Surgeon: Lollie Sails, MD;  Location: [facility] ENDOSCOPY;  Service: Endoscopy;   Laterality: N/A;  . NO PAST SURGERIES       Home Medications:  Prior to Admission medications   Medication Sig Start Date End Date Taking? Authorizing Provider  albuterol (PROVENTIL HFA;VENTOLIN HFA) 108 (90 Base) MCG/ACT inhaler Inhale 2 puffs into the lungs every 6 (six) hours as needed for shortness of breath.   Yes [provider]  Ascorbic Acid (VITAMIN C) 1000 MG tablet Take 1,000 mg by mouth daily.    Yes [provider]  aspirin EC 81 MG tablet Take 81 mg by mouth daily. Swallow whole.   Yes [provider]  calcium carbonate (OS-CAL - DOSED IN MG OF ELEMENTAL CALCIUM) 1250 (500 Ca) MG tablet Take 1 tablet by mouth daily.   Yes [provider]  Cholecalciferol (VITAMIN D3 PO) Take 500 Units by mouth daily.   Yes [provider]  clonazePAM (KLONOPIN) 0.5 MG tablet Take 0.5 mg by mouth daily as needed for anxiety.   Yes [provider]  estradiol (ESTRACE) 0.5 MG tablet Take 0.5 mg by mouth daily.   Yes [provider]  fluticasone furoate-vilanterol (BREO ELLIPTA) 100-25 MCG/INH AEPB Inhale 1 puff into the lungs daily. 08/17/18  Yes Wilhelmina Mcardle, MD  medroxyPROGESTERone (PROVERA) 2.5 MG tablet Take 2.5 mg by mouth daily. 01/31/20  Yes [provider]  metoprolol tartrate (LOPRESSOR) 25 MG tablet Take 1 tablet (25 mg total) by mouth 2 (two) times daily.  02/22/20 05/22/20 Yes Agbor-Etang, Aaron Edelman, MD  quiNINE Orlin Hilding) 324 MG capsule Take 324 mg by mouth daily at 8 pm. 03/23/19  Yes [provider]  vitamin E 1000 UNIT capsule Take 1,000 Units by mouth daily.   Yes [provider]  XARELTO 10 MG TABS tablet Take 2 tablets (20 mg total) by mouth daily. Patient taking differently: No sig reported 01/02/20  Yes Barton Dubois, MD    Inpatient Medications: Scheduled Meds: . aspirin EC  81 mg Oral Daily  . estradiol  0.5 mg Oral Daily  . fluticasone furoate-vilanterol  1 puff Inhalation Daily  .  furosemide  40 mg Intravenous Q12H  . metoprolol tartrate  25 mg Oral BID  . rivaroxaban  10 mg Oral Q supper  . sodium chloride flush  3 mL Intravenous Q12H   Continuous Infusions: . sodium chloride    . diltiazem (CARDIZEM) infusion 15 mg/hr (02/27/20 0648)  . piperacillin-tazobactam (ZOSYN)  IV 3.375 g (02/27/20 0644)   PRN Meds: sodium chloride, acetaminophen, albuterol, clonazePAM, ondansetron (ZOFRAN) IV, sodium chloride flush  Allergies:    Allergies  Allergen Reactions  . Pine Tar Cough  . Sulfa Antibiotics Rash  . Tetracyclines & Related Rash    Social History:   Social History   Socioeconomic History  . Marital status: Widowed    Spouse name: Not on file  . Number of children: Not on file  . Years of education: Not on file  . Highest education level: Not on file  Occupational History  . Not on file  Tobacco Use  . Smoking status: Former Smoker    Packs/day: 3.00    Years: 30.00    Pack years: 90.00    Quit date: 02/03/1989    Years since quitting: 31.0  . Smokeless tobacco: Never Used  Vaping Use  . Vaping Use: Never used  Substance and Sexual Activity  . Alcohol use: Yes  . Drug use: No  . Sexual activity: Not on file  Other Topics Concern  . Not on file  Social History Narrative  . Not on file   Social Determinants of Health   Financial Resource Strain: Not on file  Food Insecurity: Not on file  Transportation Needs: Not on file  Physical Activity: Not on file  Stress: Not on file  Social Connections: Not on file  Intimate Partner Violence: Not on file    Family History:    Family History  Problem Relation Age of Onset  . Varicose Veins Mother   . AAA (abdominal aortic aneurysm) Mother   . Heart attack Father   . Cancer Brother      ROS:  Please see the history of present illness.   All other ROS reviewed and negative.     Physical Exam/Data:   Vitals:   02/27/20 0130 02/27/20 0200 02/27/20 0251 02/27/20 0743  BP: 100/70  110/79 108/73 109/85  Pulse: (!) 104 93 88 99  Resp: (!) 24 19 16    Temp:   98.1 F (36.7 C) 97.7 F (36.5 C)  TempSrc:   Oral Oral  SpO2: 92% 93% 92% 93%  Weight:   71.7 kg   Height:   5\' 1"  (1.549 m)     Intake/Output Summary (Last 24 hours) at 02/27/2020 0925 Last data filed at 02/27/2020 0348 Gross per 24 hour  Intake 128.66 ml  Output --  Net 128.66 ml   Last 3 Weights 02/27/2020 02/22/2020 02/01/2020  Weight (lbs) 158 lb 158 lb 154  lb  Weight (kg) 71.668 kg 71.668 kg 69.854 kg     Body mass index is 29.85 kg/m.  General:  Middle age female,    HEENT: normal Lymph: no adenopathy Neck: no JVD Endocrine:  No thryomegaly Vascular: No carotid bruits; FA pulses 2+ bilaterally without bruits  Cardiac:  Irreg. Irreg. Soft murmur  Lungs:   Few scattered wheezed,  Reduced breath sounds left base   Abd: soft, nontender, no hepatomegaly  Ext: 1-2 + edema  Musculoskeletal:  No deformities, BUE and BLE strength normal and equal Skin: warm and dry  Neuro:  CNs 2-12 intact, no focal abnormalities noted Psych:  Normal affect   EKG:  The EKG was personally reviewed and demonstrates:  Afib with HR 114  Telemetry:  Telemetry was personally reviewed and demonstrates:    Relevant CV Studies:   Laboratory Data:  High Sensitivity Troponin:   Recent Labs  Lab 02/26/20 1234 02/26/20 1552  TROPONINIHS 15 13     Chemistry Recent Labs  Lab 02/26/20 1234 02/27/20 0344  NA 130* 129*  K 3.6 3.3*  CL 90* 90*  CO2 26 25  GLUCOSE 118* 119*  BUN 28* 31*  CREATININE 1.40* 1.46*  CALCIUM 9.4 9.0  GFRNONAA 39* 38*  ANIONGAP 14 14    Recent Labs  Lab 02/26/20 1234  PROT 7.0  ALBUMIN 3.4*  AST 398*  ALT 276*  ALKPHOS 95  BILITOT 1.8*   Hematology Recent Labs  Lab 02/26/20 1552  WBC 13.6*  RBC 4.72  HGB 11.4*  HCT 37.2  MCV 78.8*  MCH 24.2*  MCHC 30.6  RDW 16.1*  PLT 621*   BNP Recent Labs  Lab 02/26/20 1234  BNP 1,132.2*    DDimer No results for  input(s): DDIMER in the last 168 hours.   Radiology/Studies:  DG Chest Portable 1 View  Result Date: 02/26/2020 CLINICAL DATA:  Weakness EXAM: PORTABLE CHEST 1 VIEW COMPARISON:  12/31/2019 FINDINGS: Stable heart size. Atherosclerotic calcification of the aortic knob. Low lung volumes. Mild interstitial prominence bilaterally. Small bilateral pleural effusions, left greater than right. Associated bibasilar opacities. Aeration of the mid to upper lung fields has improved compared to the previous radiograph. No pneumothorax. IMPRESSION: Small bilateral pleural effusions, left greater than right. Associated bibasilar opacities, may represent atelectasis and/or pneumonia. Electronically Signed   By: Davina Poke D.O.   On: 02/26/2020 12:59   US Abdomen Limited RUQ (LIVER/GB)  Result Date: 02/26/2020 CLINICAL DATA:  RIGHT upper quadrant pain. EXAM: ULTRASOUND ABDOMEN LIMITED RIGHT UPPER QUADRANT COMPARISON:  None. FINDINGS: Gallbladder: Multiple shadowing gallstones within lumen the gallbladder measuring size from 5-10 mm. Positive sonographic Murphy's sign. No pericholecystic fluid. Common bile duct: Diameter: Normal at 4 mm Liver: Mildly complex cyst in the RIGHT hepatic lobe corresponds to comparison CT. Liver parenchyma within normal limits in parenchymal echogenicity. Portal vein is patent on color Doppler imaging with normal direction of blood flow towards the liver. Other: Trace ascites.  RIGHT pleural effusion. IMPRESSION: 1. Multiple gallstones and positive sonographic Murphy's sign. Evaluate for acute cholecystitis. 2. Normal common bile duct. 3. RIGHT effusion and trace ascites. Electronically Signed   By: Suzy Bouchard M.D.   On: 02/26/2020 13:19     Assessment and Plan:   1. CHF:   Getting Lasix 40 mg IV twice a day.  Her ins and outs recorded are not accurate.  She is urinated at least 500 cc this morning.  Continue current medications for her CHF. Continue Lasix, continue  metoprolol   2.  Atrial fibrillation: CHA2DS2-VASc Score =   4 ( age, female, CHF, HTN)    She was previously on Xarelto but only 10 mg daily.  This is the cause for DVT prophylaxis but is not adequate for stroke prevention in atrial fibrillation.   I calculate her creatinine clearance at 17 ml/min. I would suggest a pharmacy consult to help [place] decide which DOAC would be best and the proper dose   Cont Cardizem drip for rate control for now. Long term, Diltiazem is not an ideal medication for her given her markedly reduce LV function   3.  Pre-Op evaluation :   I agree that she is at increased risk for cholecystectomy.   I think she could have a perc drainage of her gall bladder and possibly an ERCP without excessive cardiac  risk.  I think her prognosis without drainage of her gall bladder is not good.  We can assess her need for lap chole as she improves xarelto is currently on hold   4.  Hypokalemia :  Potassium 40 meq was  Given  this am       New York Heart Association (NYHA) Functional Class NYHA Class III     [id number]      For questions or updates, please contact [place] Please consult [email or web address] for contact info under    Signed, Mertie Moores, MD  02/27/2020 9:25 AM

## 2020-02-28 ENCOUNTER — Inpatient Hospital Stay: Payer: Medicare HMO

## 2020-02-28 DIAGNOSIS — Z8616 Personal history of COVID-19: Secondary | ICD-10-CM

## 2020-02-28 DIAGNOSIS — I509 Heart failure, unspecified: Secondary | ICD-10-CM

## 2020-02-28 DIAGNOSIS — D72829 Elevated white blood cell count, unspecified: Secondary | ICD-10-CM

## 2020-02-28 DIAGNOSIS — I5021 Acute systolic (congestive) heart failure: Secondary | ICD-10-CM

## 2020-02-28 DIAGNOSIS — I4891 Unspecified atrial fibrillation: Secondary | ICD-10-CM

## 2020-02-28 DIAGNOSIS — K8000 Calculus of gallbladder with acute cholecystitis without obstruction: Secondary | ICD-10-CM | POA: Diagnosis not present

## 2020-02-28 DIAGNOSIS — R945 Abnormal results of liver function studies: Secondary | ICD-10-CM

## 2020-02-28 DIAGNOSIS — I480 Paroxysmal atrial fibrillation: Secondary | ICD-10-CM | POA: Diagnosis not present

## 2020-02-28 LAB — CBC WITH DIFFERENTIAL/PLATELET
Abs Immature Granulocytes: 0.18 10*3/uL — ABNORMAL HIGH (ref 0.00–0.07)
Basophils Absolute: 0 10*3/uL (ref 0.0–0.1)
Basophils Relative: 0 %
Eosinophils Absolute: 0 10*3/uL (ref 0.0–0.5)
Eosinophils Relative: 0 %
HCT: 34.1 % — ABNORMAL LOW (ref 36.0–46.0)
Hemoglobin: 11.1 g/dL — ABNORMAL LOW (ref 12.0–15.0)
Immature Granulocytes: 1 %
Lymphocytes Relative: 10 %
Lymphs Abs: 1.9 10*3/uL (ref 0.7–4.0)
MCH: 24.9 pg — ABNORMAL LOW (ref 26.0–34.0)
MCHC: 32.6 g/dL (ref 30.0–36.0)
MCV: 76.6 fL — ABNORMAL LOW (ref 80.0–100.0)
Monocytes Absolute: 1.9 10*3/uL — ABNORMAL HIGH (ref 0.1–1.0)
Monocytes Relative: 11 %
Neutro Abs: 13.9 10*3/uL — ABNORMAL HIGH (ref 1.7–7.7)
Neutrophils Relative %: 78 %
Platelets: 610 10*3/uL — ABNORMAL HIGH (ref 150–400)
RBC: 4.45 MIL/uL (ref 3.87–5.11)
RDW: 16.4 % — ABNORMAL HIGH (ref 11.5–15.5)
WBC: 17.9 10*3/uL — ABNORMAL HIGH (ref 4.0–10.5)
nRBC: 0.6 % — ABNORMAL HIGH (ref 0.0–0.2)

## 2020-02-28 LAB — BASIC METABOLIC PANEL
Anion gap: 12 (ref 5–15)
BUN: 42 mg/dL — ABNORMAL HIGH (ref 8–23)
CO2: 24 mmol/L (ref 22–32)
Calcium: 9.1 mg/dL (ref 8.9–10.3)
Chloride: 90 mmol/L — ABNORMAL LOW (ref 98–111)
Creatinine, Ser: 1.63 mg/dL — ABNORMAL HIGH (ref 0.44–1.00)
GFR, Estimated: 33 mL/min — ABNORMAL LOW (ref >60)
Glucose, Bld: 107 mg/dL — ABNORMAL HIGH (ref 70–99)
Potassium: 4.6 mmol/L (ref 3.5–5.1)
Sodium: 126 mmol/L — ABNORMAL LOW (ref 135–145)

## 2020-02-28 LAB — HEPATIC FUNCTION PANEL
ALT: 737 U/L — ABNORMAL HIGH (ref 0–44)
AST: 1059 U/L — ABNORMAL HIGH (ref 15–41)
Albumin: 3.1 g/dL — ABNORMAL LOW (ref 3.5–5.0)
Alkaline Phosphatase: 101 U/L (ref 38–126)
Bilirubin, Direct: 0.8 mg/dL — ABNORMAL HIGH (ref 0.0–0.2)
Indirect Bilirubin: 0.9 mg/dL (ref 0.3–0.9)
Total Bilirubin: 1.7 mg/dL — ABNORMAL HIGH (ref 0.3–1.2)
Total Protein: 6.1 g/dL — ABNORMAL LOW (ref 6.5–8.1)

## 2020-02-28 LAB — LACTIC ACID, PLASMA: Lactic Acid, Venous: 2.1 mmol/L (ref 0.5–1.9)

## 2020-02-28 LAB — MAGNESIUM: Magnesium: 2.1 mg/dL (ref 1.7–2.4)

## 2020-02-28 MED ORDER — METOPROLOL TARTRATE 50 MG PO TABS
75.0000 mg | ORAL_TABLET | Freq: Two times a day (BID) | ORAL | Status: DC
  Administered 2020-02-28 – 2020-02-29 (×4): 75 mg via ORAL
  Filled 2020-02-28 (×4): qty 1

## 2020-02-28 MED ORDER — HYDROMORPHONE HCL 1 MG/ML IJ SOLN: 0.5000 mg | Freq: Four times a day (QID) | INTRAMUSCULAR | Status: DC | PRN

## 2020-02-28 MED ORDER — HALOPERIDOL LACTATE 5 MG/ML IJ SOLN
5.0000 mg | Freq: Once | INTRAMUSCULAR | Status: AC
  Administered 2020-02-28: 02:00:00 5 mg via INTRAVENOUS
  Filled 2020-02-28: qty 1

## 2020-02-28 NOTE — Consult Note (Signed)
Chief Complaint: Gallstones possible acute cholecystitis. Request is for cholecystostomy tube placement.    Referring Physician(s): Yves Dill PA  Supervising Physician: Irish Lack  Patient Status: [facility] - In-pt  History of Present Illness: Paula Klein is a 74 y.o. female History of DVT on xarelto, HTN, COVID now on 2L O2, choledocoliathsis. Presented to the ED at [facility] with [facility], palpations, abdominal pain and dysphagia. Found to be in a fib with rvr, acute on chronic CHF, AKI with cholecystitis. As patient was deemed not to be a surgical candidate the Team is requesting a cholecystostomy tube placement.  Patient seen at bedside with Fiance present. Patient appears to be agitated and unable to get comfortable. Discussed care of cholecystostomy Tube placement  with patient and  Her fiance. All questions were answered and concerns were addressed. Patient and fiance agree with plan.    Past Medical History:  Diagnosis Date  . H/O blood clots   . Heart murmur   . Hypertension     Past Surgical History:  Procedure Laterality Date  . COLONOSCOPY WITH PROPOFOL N/A 09/26/2015   Procedure: COLONOSCOPY WITH PROPOFOL;  Surgeon: Christena Deem, MD;  Location: [facility] ENDOSCOPY;  Service: Endoscopy;  Laterality: N/A;  . NO PAST SURGERIES      Allergies: Pine tar, Sulfa antibiotics, and Tetracyclines & related  Medications: Prior to Admission medications   Medication Sig Start Date End Date Taking? Authorizing Provider  albuterol (PROVENTIL HFA;VENTOLIN HFA) 108 (90 Base) MCG/ACT inhaler Inhale 2 puffs into the lungs every 6 (six) hours as needed for shortness of breath.   Yes [provider]  Ascorbic Acid (VITAMIN C) 1000 MG tablet Take 1,000 mg by mouth daily.    Yes [provider]  aspirin EC 81 MG tablet Take 81 mg by mouth daily. Swallow whole.   Yes [provider]  calcium carbonate (OS-CAL - DOSED IN MG OF ELEMENTAL CALCIUM) 1250 (500 Ca) MG  tablet Take 1 tablet by mouth daily.   Yes [provider]  Cholecalciferol (VITAMIN D3 PO) Take 500 Units by mouth daily.   Yes [provider]  clonazePAM (KLONOPIN) 0.5 MG tablet Take 0.5 mg by mouth daily as needed for anxiety.   Yes [provider]  estradiol (ESTRACE) 0.5 MG tablet Take 0.5 mg by mouth daily.   Yes [provider]  fluticasone furoate-vilanterol (BREO ELLIPTA) 100-25 MCG/INH AEPB Inhale 1 puff into the lungs daily. 08/17/18  Yes Merwyn Katos, MD  medroxyPROGESTERone (PROVERA) 2.5 MG tablet Take 2.5 mg by mouth daily. 01/31/20  Yes [provider]  metoprolol tartrate (LOPRESSOR) 25 MG tablet Take 1 tablet (25 mg total) by mouth 2 (two) times daily. 02/22/20 05/22/20 Yes Agbor-Etang, Arlys John, MD  quiNINE Janith Lima) 324 MG capsule Take 324 mg by mouth daily at 8 pm. 03/23/19  Yes [provider]  vitamin E 1000 UNIT capsule Take 1,000 Units by mouth daily.   Yes [provider]  XARELTO 10 MG TABS tablet Take 2 tablets (20 mg total) by mouth daily. Patient taking differently: No sig reported 01/02/20  Yes Vassie Loll, MD     Family History  Problem Relation Age of Onset  . Varicose Veins Mother   . AAA (abdominal aortic aneurysm) Mother   . Heart attack Father   . Cancer Brother     Social History   Socioeconomic History  . Marital status: Widowed    Spouse name: Not on file  . Number of  children: Not on file  . Years of education: Not on file  . Highest education level: Not on file  Occupational History  . Not on file  Tobacco Use  . Smoking status: Former Smoker    Packs/day: 3.00    Years: 30.00    Pack years: 90.00    Quit date: 02/03/1989    Years since quitting: 31.0  . Smokeless tobacco: Never Used  Vaping Use  . Vaping Use: Never used  Substance and Sexual Activity  . Alcohol use: Yes  . Drug use: No  . Sexual activity: Not on file  Other Topics Concern  . Not on file  Social  History Narrative  . Not on file   Social Determinants of Health   Financial Resource Strain: Not on file  Food Insecurity: Not on file  Transportation Needs: Not on file  Physical Activity: Not on file  Stress: Not on file  Social Connections: Not on file     Review of Systems: A 12 point ROS discussed and pertinent positives are indicated in the HPI above.  All other systems are negative.  Review of Systems  Constitutional: Negative for fatigue and fever.  HENT: Negative for congestion.   Respiratory: Negative for cough and shortness of breath.   Gastrointestinal: Negative for abdominal pain, diarrhea, nausea and vomiting.    Vital Signs: BP (!) 151/92 (BP Location: Right Arm)   Pulse (!) 112   Temp 98.2 F (36.8 C) (Axillary)   Resp 17   Ht 5\' 1"  (1.549 m)   Wt 166 lb 1.6 oz (75.3 kg)   SpO2 91%   BMI 31.38 kg/m   Physical Exam Vitals and nursing note reviewed.  Constitutional:      Appearance: She is well-developed.  HENT:     Head: Normocephalic and atraumatic.  Eyes:     Conjunctiva/sclera: Conjunctivae normal.  Cardiovascular:     Rate and Rhythm: Tachycardia present. Rhythm irregular.  Pulmonary:     Effort: Pulmonary effort is normal.     Breath sounds: Normal breath sounds.     Comments: On 2L O2.  Musculoskeletal:        General: Normal range of motion.     Cervical back: Normal range of motion.  Skin:    General: Skin is warm.  Neurological:     Mental Status: She is alert.     Comments: Oriented to person and place. Appears agitated.      Imaging: CT ABDOMEN PELVIS WO CONTRAST  Result Date: 02/28/2020 CLINICAL DATA:  Leukocytosis. EXAM: CT ABDOMEN AND PELVIS WITHOUT CONTRAST TECHNIQUE: Multidetector CT imaging of the abdomen and pelvis was performed following the standard protocol without IV contrast. COMPARISON:  January 03, 2020. FINDINGS: Lower chest: Moderate bilateral pleural effusions are noted, right greater than left, with  associated right basilar atelectasis or infiltrate. Hepatobiliary: Cholelithiasis is noted. No biliary dilatation is noted. Several hepatic cysts are noted and stable. Pancreas: Unremarkable. No pancreatic ductal dilatation or surrounding inflammatory changes. Spleen: Calcified splenic granulomata are again noted. There appears to be interval development of fluid density measuring 5.7 x 4.4 cm seen involving the superior aspect of the spleen. This is concerning for possible abscess or perisplenic hematoma. Adrenals/Urinary Tract: Adrenal glands are unremarkable. Kidneys are normal, without renal calculi, focal lesion, or hydronephrosis. Bladder is unremarkable. Stomach/Bowel: Stomach appears normal. There is no evidence of bowel obstruction or inflammation. The appendix is not visualized, but no inflammation is noted in the right lower quadrant.  Vascular/Lymphatic: Aortic atherosclerosis. No enlarged abdominal or pelvic lymph nodes. Reproductive: Intrauterine device is noted. No adnexal abnormality is noted. Other: No abdominal wall hernia or abnormality. No abdominopelvic ascites. Musculoskeletal: No acute or significant osseous findings. IMPRESSION: 1. Moderate bilateral pleural effusions are noted, right greater than left, with associated right basilar atelectasis or infiltrate. 2. Interval development of 5.7 x 4.4 cm fluid density seen involving the superior aspect of the spleen concerning for possible abscess or perisplenic hematoma. CT scan of the abdomen with intravenous contrast is recommended for further evaluation. 3. Cholelithiasis. 4. Aortic atherosclerosis. Aortic Atherosclerosis (ICD10-I70.0). Electronically Signed   By: Marijo Conception M.D.   On: 02/28/2020 09:15   DG Chest Portable 1 View  Result Date: 02/26/2020 CLINICAL DATA:  Weakness EXAM: PORTABLE CHEST 1 VIEW COMPARISON:  12/31/2019 FINDINGS: Stable heart size. Atherosclerotic calcification of the aortic knob. Low lung volumes. Mild  interstitial prominence bilaterally. Small bilateral pleural effusions, left greater than right. Associated bibasilar opacities. Aeration of the mid to upper lung fields has improved compared to the previous radiograph. No pneumothorax. IMPRESSION: Small bilateral pleural effusions, left greater than right. Associated bibasilar opacities, may represent atelectasis and/or pneumonia. Electronically Signed   By: Davina Poke D.O.   On: 02/26/2020 12:59   ECHOCARDIOGRAM COMPLETE  Result Date: 02/24/2020    ECHOCARDIOGRAM REPORT   Patient Name:   Paula Klein Date of Exam: 02/22/2020 Medical Rec #:  [id number]      Height:       61.0 in Accession #:    [id number]     Weight:       158.0 lb Date of Birth:  [birth year]      BSA:          1.709 m Patient Age:    27 years       BP:           106/62 mmHg Patient Gender: F              HR:           120 bpm. Exam Location:  [place] Procedure: 2D Echo, Color Doppler and Cardiac Doppler Indications:    R00.0 Tachycardia; I48.91* Unspeicified atrial fibrillation  History:        Patient has no prior history of Echocardiogram examinations.                 Signs/Symptoms:Shortness of Breath; Risk Factors:Hypertension                 and Former Smoker.  Sonographer:    Pilar Jarvis RDMS, RVT, RDCS Referring Phys: [id number] [facility]  Sonographer Comments: Dr Fletcher Anon alerted afib rate, MR and effusion IMPRESSIONS  1. Left ventricular ejection fraction, by estimation, is 20 to 25%. The left ventricle has severely decreased function. The left ventricle has no regional wall motion abnormalities. Left ventricular diastolic parameters are indeterminate.  2. Right ventricular systolic function is mildly reduced. The right ventricular size is mildly enlarged. There is mildly elevated pulmonary artery systolic pressure. The estimated right ventricular systolic pressure is [id number] mmHg.  3. Left atrial size was moderately dilated.  4. Right atrial size was moderately dilated.  5.  A small pericardial effusion is present. The pericardial effusion is posterior to the left ventricle. There is no evidence of cardiac tamponade. Moderate pleural effusion in the left lateral region.  6. The mitral valve is normal in structure. Moderate mitral valve regurgitation. No evidence of mitral stenosis.  7.  The aortic valve is normal in structure. Aortic valve regurgitation is not visualized. No aortic stenosis is present.  8. The inferior vena cava is dilated in size with <50% respiratory variability, suggesting right atrial pressure of 15 mmHg. FINDINGS  Left Ventricle: Left ventricular ejection fraction, by estimation, is 20 to 25%. The left ventricle has severely decreased function. The left ventricle has no regional wall motion abnormalities. The left ventricular internal cavity size was normal in size. There is no left ventricular hypertrophy. Left ventricular diastolic parameters are indeterminate. Right Ventricle: The right ventricular size is mildly enlarged. No increase in right ventricular wall thickness. Right ventricular systolic function is mildly reduced. There is mildly elevated pulmonary artery systolic pressure. The tricuspid regurgitant  velocity is 2.23 m/s, and with an assumed right atrial pressure of 15 mmHg, the estimated right ventricular systolic pressure is 34.9 mmHg. Left Atrium: Left atrial size was moderately dilated. Right Atrium: Right atrial size was moderately dilated. Pericardium: A small pericardial effusion is present. The pericardial effusion is posterior to the left ventricle. There is no evidence of cardiac tamponade. Mitral Valve: The mitral valve is normal in structure. There is moderate thickening of the mitral valve leaflet(s). Moderate mitral valve regurgitation. No evidence of mitral valve stenosis. Tricuspid Valve: The tricuspid valve is normal in structure. Tricuspid valve regurgitation is mild . No evidence of tricuspid stenosis. Aortic Valve: The aortic valve  is normal in structure. Aortic valve regurgitation is not visualized. No aortic stenosis is present. Aortic valve mean gradient measures 1.0 mmHg. Aortic valve peak gradient measures 1.7 mmHg. Aortic valve area, by VTI measures 3.54 cm. Pulmonic Valve: The pulmonic valve was normal in structure. Pulmonic valve regurgitation is mild. No evidence of pulmonic stenosis. Aorta: The aortic root is normal in size and structure. Venous: The inferior vena cava is dilated in size with less than 50% respiratory variability, suggesting right atrial pressure of 15 mmHg. IAS/Shunts: No atrial level shunt detected by color flow Doppler. Additional Comments: There is a moderate pleural effusion in the left lateral region.  LEFT VENTRICLE PLAX 2D LVOT diam:     2.10 cm LV SV:         27 LV SV Index:   16 LVOT Area:     3.46 cm  LV Volumes (MOD) LV vol d, MOD A2C: 90.1 ml LV vol d, MOD A4C: 80.0 ml LV vol s, MOD A2C: 68.9 ml LV vol s, MOD A4C: 64.5 ml LV SV MOD A2C:     21.2 ml LV SV MOD A4C:     80.0 ml LV SV MOD BP:      16.8 ml RIGHT VENTRICLE            IVC RV Basal diam:  4.15 cm    IVC diam: 2.30 cm RV S prime:     5.87 cm/s TAPSE (M-mode): 1.1 cm LEFT ATRIUM             Index       RIGHT ATRIUM           Index LA diam:        4.60 cm 2.69 cm/m  RA Area:     21.85 cm LA Vol (A2C):   65.5 ml 38.33 ml/m RA Volume:   69.80 ml  40.85 ml/m LA Vol (A4C):   56.6 ml 33.12 ml/m LA Biplane Vol: 63.4 ml 37.10 ml/m  AORTIC VALVE AV Area (Vmax):    3.30 cm AV Area (Vmean):  2.85 cm AV Area (VTI):     3.54 cm AV Vmax:           65.40 cm/s AV Vmean:          45.400 cm/s AV VTI:            0.077 m AV Peak Grad:      1.7 mmHg AV Mean Grad:      1.0 mmHg LVOT Vmax:         62.35 cm/s LVOT Vmean:        37.400 cm/s LVOT VTI:          0.079 m LVOT/AV VTI ratio: 1.02  AORTA Ao Root diam: 3.40 cm Ao Asc diam:  3.40 cm Ao Arch diam: 2.8 cm TRICUSPID VALVE TR Peak grad:   19.9 mmHg TR Vmax:        223.00 cm/s  SHUNTS Systemic VTI:  0.08 m  Systemic Diam: 2.10 cm Kathlyn Sacramento MD Electronically signed by Kathlyn Sacramento MD Signature Date/Time: 02/24/2020/7:22:16 AM    Final    US Abdomen Limited RUQ (LIVER/GB)  Result Date: 02/26/2020 CLINICAL DATA:  RIGHT upper quadrant pain. EXAM: ULTRASOUND ABDOMEN LIMITED RIGHT UPPER QUADRANT COMPARISON:  None. FINDINGS: Gallbladder: Multiple shadowing gallstones within lumen the gallbladder measuring size from 5-10 mm. Positive sonographic Murphy's sign. No pericholecystic fluid. Common bile duct: Diameter: Normal at 4 mm Liver: Mildly complex cyst in the RIGHT hepatic lobe corresponds to comparison CT. Liver parenchyma within normal limits in parenchymal echogenicity. Portal vein is patent on color Doppler imaging with normal direction of blood flow towards the liver. Other: Trace ascites.  RIGHT pleural effusion. IMPRESSION: 1. Multiple gallstones and positive sonographic Murphy's sign. Evaluate for acute cholecystitis. 2. Normal common bile duct. 3. RIGHT effusion and trace ascites. Electronically Signed   By: Suzy Bouchard M.D.   On: 02/26/2020 13:19    Labs:  CBC: Recent Labs    01/02/20 0703 01/03/20 0908 02/26/20 1552 02/28/20 0357  WBC 18.9* 21.2* 13.6* 17.9*  HGB 12.5 12.1 11.4* 11.1*  HCT 39.9 37.9 37.2 34.1*  PLT 521* 442* 621* 610*    COAGS: No results for input(s): INR, APTT in the last 8760 hours.  BMP: Recent Labs    01/03/20 0908 02/26/20 1234 02/27/20 0344 02/28/20 0357  NA 135 130* 129* 126*  K 2.9* 3.6 3.3* 4.6  CL 101 90* 90* 90*  CO2 24 26 25 24   GLUCOSE 126* 118* 119* 107*  BUN 27* 28* 31* 42*  CALCIUM 8.7* 9.4 9.0 9.1  CREATININE 0.74 1.40* 1.46* 1.63*  GFRNONAA >60 39* 38* 33*    LIVER FUNCTION TESTS: Recent Labs    01/02/20 0703 01/03/20 0908 02/26/20 1234 02/28/20 0357  BILITOT 0.7 0.7 1.8* 1.7*  AST 28 32 398* 1,059*  ALT 36 44 276* 737*  ALKPHOS 71 71 95 101  PROT 6.3* 5.9* 7.0 6.1*  ALBUMIN 2.9* 2.9* 3.4* 3.1*    Assessment  and Plan:  74 y.o female inpatient.  History of DVT on xarelto, HTN, COVID now on 2L O2, choledocoliathsis. Presented to the ED at [facility] with [facility], palpations, abdominal pain and dysphagia. Found to be in a fib with rvr, acute on chronic CHF, AKI with cholecystitis. As patient was deemed not to be a surgical candidate the Team is requesting a cholecystostomy tube placement   US abdomen from 12.25.21 reads Multiple gallstones and positive sonographic Murphy's sign. Evaluate for acute cholecystitis. CT from 12.27.21 reads Cholelithiasis. WBC  17.9 (  rising), total bilirubin 1.7, AST 1059, ALT 737, BUN 42, Ct 1.63. last dose of eliquis was given on 12.26.21 @ 20:29   After review of the CT abdomen by IR Attending Dr. Pilar Jarvis recommend  Team obtain a [place] tomorrow morning (12.28.21) to reassess the gallbladder. Team made aware that based upon the finding of the US abdomen the patient will be tentatively scheduled cholecystostomy tube placement for 12.28.21.  Team also informed due to the patient's gallbladder being non-distended and surrounded by colon a cholecystostomy tube placement will be difficult.   Team instructed to: Keep Patient to be NPO after midnight Continue to hold eliquis Obtain US abdomen the morning of 12.28.21 for further evaluation.  IR will call patient when ready.  Risks and benefits discussed with the patient and fiance including, but not limited to bleeding, infection, gallbladder perforation, bile leak, sepsis or even death.  All of the patient's  And fiance's questions were answered, patient  And fiance are agreeable to proceed. Consent signed and in chart.  Thank you for this interesting consult.  I greatly enjoyed meeting Paula Klein and look forward to participating in their care.  A copy of this report was sent to the requesting provider on this date.  Electronically Signed: Jacqualine Mau, NP 02/28/2020, 9:54 AM   I spent a total of 40 Minutes  in face to  face in clinical consultation, greater than 50% of which was counseling/coordinating care for cholecystostomy

## 2020-02-28 NOTE — Progress Notes (Addendum)
[facility] SURGICAL PROGRESS NOTE (cpt [phone])  Hospital Day(s): 2.   Interval History: Patient seen and examined, no acute events or new complaints overnight. Patient is very agitated this morning, somewhat confused, unable to reliably participate in the interview. When asked about abdominal pain, she is unable to answer because "she has not had anything to eat." Leukocytosis worsening this morning to 17.9K. Renal function also worsened this morning with sCr - 1.63. She has continued hyponatremia to 126. Hyperbilirubinemia persistent at 1.7 which appears split (direct 0.8, indirect 0.9). AST significantly increased to 1059 (from 398), and ALT up to 737 (from 276).  Review of Systems:  Unable to reliably preform secondary to agitation / confusion   Vital signs in last 24 hours: [min-max] current  Temp:  [97.6 F (36.4 C)-97.9 F (36.6 C)] 97.9 F (36.6 C) (12/27 0417) Pulse Rate:  [81-99] 91 (12/27 0417) Resp:  [17-19] 19 (12/27 0417) BP: (103-133)/(79-91) 133/88 (12/27 0417) SpO2:  [90 %-97 %] 90 % (12/27 0417) Weight:  [75.3 kg] 75.3 kg (12/27 0417)     Height: 5\' 1"  (154.9 cm) Weight: 75.3 kg BMI (Calculated): 31.4   Intake/Output last 2 shifts:  12/26 0701 - 12/27 0700 In: 409.2 [P.O.:240; I.V.:105.5; IV Piggyback:63.7] Out: 450 [Urine:450]   Physical Exam:  Constitutional: alert, agitated, confused, trying to get out of bed HENT: normocephalic without obvious abnormality  Eyes: PERRL, EOM's grossly intact and symmetric  Respiratory: Tachypneic, short shallow breaths Gastrointestinal: Abdomen is soft, difficult to assess, she does wince with palpation of RUQ, non-distended Musculoskeletal: + edema, safety mittens to upper extremities    Labs:  CBC Latest Ref Rng & Units 02/28/2020 02/26/2020 01/03/2020  WBC 4.0 - 10.5 K/uL 17.9(H) 13.6(H) 21.2(H)  Hemoglobin 12.0 - 15.0 g/dL 11.1(L) 11.4(L) 12.1  Hematocrit 36.0 - 46.0 % 34.1(L) 37.2 37.9  Platelets 150 -  400 K/uL 610(H) 621(H) 442(H)   CMP Latest Ref Rng & Units 02/28/2020 02/27/2020 02/26/2020  Glucose 70 - 99 mg/dL 009(Q) 330(Q) 762(U)  BUN 8 - 23 mg/dL 63(F) 35(K) 56(Y)  Creatinine 0.44 - 1.00 mg/dL 5.63(S) 9.37(D) 4.28(J)  Sodium 135 - 145 mmol/L 126(L) 129(L) 130(L)  Potassium 3.5 - 5.1 mmol/L 4.6 3.3(L) 3.6  Chloride 98 - 111 mmol/L 90(L) 90(L) 90(L)  CO2 22 - 32 mmol/L 24 25 26   Calcium 8.9 - 10.3 mg/dL 9.1 9.0 9.4  Total Protein 6.5 - 8.1 g/dL 6.1(L) - 7.0  Total Bilirubin 0.3 - 1.2 mg/dL 6.8(T) - 1.8(H)  Alkaline Phos 38 - 126 U/L 101 - 95  AST 15 - 41 U/L 1,059(H) - 398(H)  ALT 0 - 44 U/L 737(H) - 276(H)    Imaging studies: No new pertinent imaging studies   Assessment/Plan: (ICD-10's: K81.0) 74 y.o. female with worsening leukocytosis, LFT elevation, and persistent mixed hyperbilirubinemia, admitted with likely acute cholecystitis, complicated by pertinent comorbidities including atrial fibrillation on anticoagulation, CHF, history of COVID.   - As I believe she has worsened from a gallbladder standpoint this morning, I do believe she will need intervention with likely percutaneous cholecystostomy. I discussed with Dr Fredia Sorrow (IR), and recommended repeat imaging with CT first, which I have ordered.      - I did make her NPO as a precaution this morning   - Continue IV Abx (Zosyn)  - Monitor abdominal examination  - Pain control prn; antiemetics prn  - Appreciate cardiology assistance  - She is unfortunately NOT a good surgical candidate given her comorbid conditions  -  Further management per primary service; we will follow   All of the above findings and recommendations were discussed with the medical team.  -- Edison Simon, PA-C [facility] 02/28/2020, 7:15 AM [phone] M-F: 7am - 4pm

## 2020-02-28 NOTE — Progress Notes (Signed)
PROGRESS NOTE    Paula Klein  PIR:[id number] DOB: 04/24/1945 DOA: 02/26/2020 PCP: Leanna Sato, MD   Chief Complaint.  Shortness of breath and abdominal pain. Brief Narrative:  LindaFuquayis a 74 year old female with history ofDVTon chronic anticoagulation, essential hypertension who came to the hospital with multiple complaints including short of breath, palpitation, abdominal pain and dysphagia. Upon arriving the hospital, her creatinine was 1.4, sodium 130. AST 398, ALT 276, total protein 1.8, BNP 1132,procalcitonin level 0.12, chest x-ray showed bilateral lower lobe infiltrates, small pleural effusion. Right upper quadrant ultrasound showed cholecystitis with positive Murphy sign. Telemetry showed atrial fibrillation with rapid ventricular response. Troponin 15. Patient is started on IV Lasix for acute exacerbation of congestive heart failure, her most recent echocardiogram was performed last month with ejection fraction 20 to 25%. Patient is also started on Zosyn for acute cholecystitis with gallstone, seen by general surgery.   Assessment & Plan:   Principal Problem:   Acute HFrEF (heart failure with reduced ejection fraction) (HCC) Active Problems:   History of DVT of lower extremity   AF (paroxysmal atrial fibrillation) (HCC)   Cholecystitis with cholelithiasis   Dysphagia   Severe protein-calorie malnutrition (HCC)   AKI (acute kidney injury) (HCC)   Hyponatremia   Elevated liver enzymes   Atrial fibrillation with rapid ventricular response (HCC)  #1.  Acute on chronic systolic congestive heart failure. New onset atrial fibrillation with rapid ventricular response. Patient had ejection fraction between 20 to 25%. Appreciate cardiology consult. Patient currently off diltiazem drip for atrial fibrillation.  Anticoagulation discontinued in case patient need a surgical intervention for cholecystitis. Patient was started on liquid diet for cholecystitis, will  restrict fluids to 1200 mL. Lasix discontinued by cardiology due to worsening renal function. Continue metoprolol at 75 mg twice a day.  2.  Cholecystitis with cholelithiasis. Elevated liver function. Patient has significant abdominal pain.  She looks acutely ill today.  She is a seen by general surgery and interventional radiologist, has not been decided on cholecystotomy draining.  At this point, I will continue Zosyn.  Added Dilaudid for pain control.  Also check blood cultures and lactic acid to rule out sepsis. Patient has worsening liver function changes today.  Differential including due to congestive heart failure, need to rule out sepsis.  #3.  Acute kidney injury. Renal function is slightly worsened today, Lasix were discontinued.  4.  Severe protein calorie malnutrition. Patient has a poor p.o. intake.  #5.  Hyponatremia. Patient has worsening hyponatremia today, restrict p.o. fluids to 1200 mL.  Continue to follow BMP.  #6.  Thrombocytosis. Reactive, secondary to infection.  Prognosis: Patient condition currently is critical.  She has a very high risk for deterioration and mortality.    DVT prophylaxis: SCDs Code Status: Full Family Communication: Husband at bedside Disposition Plan:  .   Status is: Inpatient  Remains inpatient appropriate because:Inpatient level of care appropriate due to severity of illness   Dispo: The patient is from: Home              Anticipated d/c is to: ?              Anticipated d/c date is: > 3 days              Patient currently is not medically stable to d/c.        I/O last 3 completed shifts: In: 537.9 [P.O.:240; I.V.:201.5; IV Piggyback:96.4] Out: 450 [Urine:450] Total I/O In: 480 [P.O.:480] Out: 325 [  Urine:325]     Consultants:   Cardiology, GI, IR, General surgery  Procedures: None  Antimicrobials: Zosyn  Subjective: Patient is agitated today, she has some confusion.  She has been complaining significant  abdominal pain, she was able to take in some liquid diet. She does not have any nausea vomiting or diarrhea. Her short of breath seem to be improving today.  She has a cough, nonproductive. She has no fever or chills. No chest pain or palpitation. No dysuria or hematuria  Objective: Vitals:   02/27/20 2009 02/28/20 0417 02/28/20 0800 02/28/20 1145  BP: (!) 116/91 133/88 (!) 151/92 115/81  Pulse: 83 91 (!) 112 81  Resp: 17 19 17 18   Temp: 97.6 F (36.4 C) 97.9 F (36.6 C) 98.2 F (36.8 C) 98.3 F (36.8 C)  TempSrc: Oral Oral Axillary Oral  SpO2: 91% 90% 91% 95%  Weight:  75.3 kg    Height:        Intake/Output Summary (Last 24 hours) at 02/28/2020 1242 Last data filed at 02/28/2020 1147 Gross per 24 hour  Intake 889.24 ml  Output 775 ml  Net 114.24 ml   Filed Weights   02/27/20 0251 02/28/20 0417  Weight: 71.7 kg 75.3 kg    Examination:  General exam: Appears calm, ill-appearing Respiratory system: A few crackles in the left base. Respiratory effort normal. Cardiovascular system: Irregularly irregular, mild tachycardic. no JVD, murmurs, rubs, gallops or clicks.  Gastrointestinal system: Abdomen is nondistended, soft and RUQ tender. No organomegaly or masses felt. Normal bowel sounds heard. Central nervous system: Alert and oriented x3. No focal neurological deficits. Extremities: 1+ leg edema Skin: No rashes, lesions or ulcers Psychiatry: Mood & affect appropriate.     Data Reviewed: I have personally reviewed following labs and imaging studies  CBC: Recent Labs  Lab 02/26/20 1552 02/28/20 0357  WBC 13.6* 17.9*  NEUTROABS 9.5* 13.9*  HGB 11.4* 11.1*  HCT 37.2 34.1*  MCV 78.8* 76.6*  PLT 621* [id number]*   Basic Metabolic Panel: Recent Labs  Lab 02/26/20 1234 02/27/20 0344 02/28/20 0357  NA 130* 129* 126*  K 3.6 3.3* 4.6  CL 90* 90* 90*  CO2 26 25 24   GLUCOSE 118* 119* 107*  BUN 28* 31* 42*  CREATININE 1.40* 1.46* 1.63*  CALCIUM 9.4 9.0 9.1  MG  --   2.0 2.1   GFR: Estimated Creatinine Clearance: 28.1 mL/min (A) (by C-G formula based on SCr of 1.63 mg/dL (H)). Liver Function Tests: Recent Labs  Lab 02/26/20 1234 02/28/20 0357  AST 398* 1,059*  ALT 276* 737*  ALKPHOS 95 101  BILITOT 1.8* 1.7*  PROT 7.0 6.1*  ALBUMIN 3.4* 3.1*   Recent Labs  Lab 02/26/20 1234  LIPASE 26   No results for input(s): AMMONIA in the last 168 hours. Coagulation Profile: No results for input(s): INR, PROTIME in the last 168 hours. Cardiac Enzymes: No results for input(s): CKTOTAL, CKMB, CKMBINDEX, TROPONINI in the last 168 hours. BNP (last 3 results) No results for input(s): PROBNP in the last 8760 hours. HbA1C: No results for input(s): HGBA1C in the last 72 hours. CBG: No results for input(s): GLUCAP in the last 168 hours. Lipid Profile: No results for input(s): CHOL, HDL, LDLCALC, TRIG, CHOLHDL, LDLDIRECT in the last 72 hours. Thyroid Function Tests: No results for input(s): TSH, T4TOTAL, FREET4, T3FREE, THYROIDAB in the last 72 hours. Anemia Panel: No results for input(s): VITAMINB12, FOLATE, FERRITIN, TIBC, IRON, RETICCTPCT in the last 72 hours. Sepsis Labs: Recent Labs  Lab 02/26/20 1234  PROCALCITON 0.12    Recent Results (from the past 240 hour(s))  Resp Panel by RT-PCR (Flu A&B, Covid) Nasopharyngeal Swab     Status: None   Collection Time: 02/26/20 12:43 PM   Specimen: Nasopharyngeal Swab; Nasopharyngeal(NP) swabs in vial transport medium  Result Value Ref Range Status   SARS Coronavirus 2 by RT PCR NEGATIVE NEGATIVE Final    Comment: (NOTE) SARS-CoV-2 target nucleic acids are NOT DETECTED.  The SARS-CoV-2 RNA is generally detectable in upper respiratory specimens during the acute phase of infection. The lowest concentration of SARS-CoV-2 viral copies this assay can detect is 138 copies/mL. A negative result does not preclude SARS-Cov-2 infection and should not be used as the sole basis for treatment or other patient  management decisions. A negative result may occur with  improper specimen collection/handling, submission of specimen other than nasopharyngeal swab, presence of viral mutation(s) within the areas targeted by this assay, and inadequate number of viral copies(<138 copies/mL). A negative result must be combined with clinical observations, patient history, and epidemiological information. The expected result is Negative.  Fact Sheet for Patients:  EntrepreneurPulse.com.au  Fact Sheet for Healthcare Providers:  IncredibleEmployment.be  This test is no t yet approved or cleared by the [place] FDA and  has been authorized for detection and/or diagnosis of SARS-CoV-2 by FDA under an Emergency Use Authorization (EUA). This EUA will remain  in effect (meaning this test can be used) for the duration of the COVID-19 declaration under Section 564(b)(1) of the Act, 21 U.S.C.section 360bbb-3(b)(1), unless the authorization is terminated  or revoked sooner.       Influenza A by PCR NEGATIVE NEGATIVE Final   Influenza B by PCR NEGATIVE NEGATIVE Final    Comment: (NOTE) The Xpert Xpress SARS-CoV-2/FLU/RSV plus assay is intended as an aid in the diagnosis of influenza from Nasopharyngeal swab specimens and should not be used as a sole basis for treatment. Nasal washings and aspirates are unacceptable for Xpert Xpress SARS-CoV-2/FLU/RSV testing.  Fact Sheet for Patients: EntrepreneurPulse.com.au  Fact Sheet for Healthcare Providers: IncredibleEmployment.be  This test is not yet approved or cleared by the [place] FDA and has been authorized for detection and/or diagnosis of SARS-CoV-2 by FDA under an Emergency Use Authorization (EUA). This EUA will remain in effect (meaning this test can be used) for the duration of the COVID-19 declaration under Section 564(b)(1) of the Act, 21 U.S.C. section 360bbb-3(b)(1),  unless the authorization is terminated or revoked.  Performed at [facility], [address], [place], [place] [address]          Radiology Studies: CT ABDOMEN PELVIS WO CONTRAST  Result Date: 02/28/2020 CLINICAL DATA:  Leukocytosis. EXAM: CT ABDOMEN AND PELVIS WITHOUT CONTRAST TECHNIQUE: Multidetector CT imaging of the abdomen and pelvis was performed following the standard protocol without IV contrast. COMPARISON:  January 03, 2020. FINDINGS: Lower chest: Moderate bilateral pleural effusions are noted, right greater than left, with associated right basilar atelectasis or infiltrate. Hepatobiliary: Cholelithiasis is noted. No biliary dilatation is noted. Several hepatic cysts are noted and stable. Pancreas: Unremarkable. No pancreatic ductal dilatation or surrounding inflammatory changes. Spleen: Calcified splenic granulomata are again noted. There appears to be interval development of fluid density measuring 5.7 x 4.4 cm seen involving the superior aspect of the spleen. This is concerning for possible abscess or perisplenic hematoma. Adrenals/Urinary Tract: Adrenal glands are unremarkable. Kidneys are normal, without renal calculi, focal lesion, or hydronephrosis. Bladder is unremarkable. Stomach/Bowel: Stomach appears  normal. There is no evidence of bowel obstruction or inflammation. The appendix is not visualized, but no inflammation is noted in the right lower quadrant. Vascular/Lymphatic: Aortic atherosclerosis. No enlarged abdominal or pelvic lymph nodes. Reproductive: Intrauterine device is noted. No adnexal abnormality is noted. Other: No abdominal wall hernia or abnormality. No abdominopelvic ascites. Musculoskeletal: No acute or significant osseous findings. IMPRESSION: 1. Moderate bilateral pleural effusions are noted, right greater than left, with associated right basilar atelectasis or infiltrate. 2. Interval development of 5.7 x 4.4 cm fluid density seen involving the  superior aspect of the spleen concerning for possible abscess or perisplenic hematoma. CT scan of the abdomen with intravenous contrast is recommended for further evaluation. 3. Cholelithiasis. 4. Aortic atherosclerosis. Aortic Atherosclerosis (ICD10-I70.0). Electronically Signed   By: Marijo Conception M.D.   On: 02/28/2020 09:15   DG Chest Portable 1 View  Result Date: 02/26/2020 CLINICAL DATA:  Weakness EXAM: PORTABLE CHEST 1 VIEW COMPARISON:  12/31/2019 FINDINGS: Stable heart size. Atherosclerotic calcification of the aortic knob. Low lung volumes. Mild interstitial prominence bilaterally. Small bilateral pleural effusions, left greater than right. Associated bibasilar opacities. Aeration of the mid to upper lung fields has improved compared to the previous radiograph. No pneumothorax. IMPRESSION: Small bilateral pleural effusions, left greater than right. Associated bibasilar opacities, may represent atelectasis and/or pneumonia. Electronically Signed   By: Davina Poke D.O.   On: 02/26/2020 12:59   US Abdomen Limited RUQ (LIVER/GB)  Result Date: 02/26/2020 CLINICAL DATA:  RIGHT upper quadrant pain. EXAM: ULTRASOUND ABDOMEN LIMITED RIGHT UPPER QUADRANT COMPARISON:  None. FINDINGS: Gallbladder: Multiple shadowing gallstones within lumen the gallbladder measuring size from 5-10 mm. Positive sonographic Murphy's sign. No pericholecystic fluid. Common bile duct: Diameter: Normal at 4 mm Liver: Mildly complex cyst in the RIGHT hepatic lobe corresponds to comparison CT. Liver parenchyma within normal limits in parenchymal echogenicity. Portal vein is patent on color Doppler imaging with normal direction of blood flow towards the liver. Other: Trace ascites.  RIGHT pleural effusion. IMPRESSION: 1. Multiple gallstones and positive sonographic Murphy's sign. Evaluate for acute cholecystitis. 2. Normal common bile duct. 3. RIGHT effusion and trace ascites. Electronically Signed   By: Suzy Bouchard M.D.    On: 02/26/2020 13:19        Scheduled Meds: . feeding supplement  237 mL Oral BID BM  . fluticasone furoate-vilanterol  1 puff Inhalation Daily  . metoprolol tartrate  75 mg Oral BID  . sodium chloride flush  3 mL Intravenous Q12H   Continuous Infusions: . sodium chloride    . piperacillin-tazobactam (ZOSYN)  IV 3.375 g (02/28/20 0628)     LOS: 2 days    Time spent: 40 minutes    Sharen Hones, MD [facility]   To contact the attending provider between 7A-7P or the covering provider during after hours 7P-7A, please log into the web site [email or web address] and access using universal [place] password for that web site. If you do not have the password, please call the hospital operator.  02/28/2020, 12:42 PM

## 2020-02-28 NOTE — Progress Notes (Signed)
PT Cancellation Note  Patient Details Name: Paula Klein MRN: [id number] DOB: 14-Jul-1945   Cancelled Treatment:    Reason Eval/Treat Not Completed: Pt asleep upon arrival with family member along bedside.  Family member notes that she was attempting to rest as she has not had a good night of sleep since being admitted.  Family member requesting therapist to come at another date/time as medically appropriate.  Gwenlyn Saran, PT, DPT 02/28/20, 12:56 PM

## 2020-02-28 NOTE — Consult Note (Signed)
Consultation  Referring Provider:  Dr. Roosevelt Locks Admit date: 12/25 Consult date: 12/27         Reason for Consultation: Elevated Liver Enzymes         HPI:   Paula Klein is a 74 y.o. lady with history of severe covid requiring subsequent O2. Also with history of COPD, HFrEF (EF 25%) DVT's on NOAC, and varicose veins who presented to hospital with shortness of breath, a. Fib, nausea, vomiting, and right upper quadrant pain. She was previously diagnosed with cholelithiasis but on CT imaging done here in addition to ultrasound she has had a normal CBD and just cholelithiasis. On interview with husband present she has had on and off abdominal pain since being discharged from hospital two months ago due to covid and was recently found to have severely reduced EF on echocardiogram. Her primary symptoms on this admission was abdominal pain, nausea, and vomiting. Patient with slightly altered mental status but it comes and goes. Her blood pressure has been normal so far. LFT's have shown normal alkaline phosphatase, indirect predominant bilirubin, and elevated AST/ALT to 1000/700 respectively. No history of IV drug use or significant alcohol use. No personal or family history of liver disease.   Past Medical History:  Diagnosis Date  . H/O blood clots   . Heart murmur   . Hypertension     Past Surgical History:  Procedure Laterality Date  . COLONOSCOPY WITH PROPOFOL N/A 09/26/2015   Procedure: COLONOSCOPY WITH PROPOFOL;  Surgeon: Lollie Sails, MD;  Location: [facility] ENDOSCOPY;  Service: Endoscopy;  Laterality: N/A;  . NO PAST SURGERIES      Family History  Problem Relation Age of Onset  . Varicose Veins Mother   . AAA (abdominal aortic aneurysm) Mother   . Heart attack Father   . Cancer Brother   No family history of liver disease  Social History   Tobacco Use  . Smoking status: Former Smoker    Packs/day: 3.00    Years: 30.00    Pack years: 90.00    Quit date: 02/03/1989    Years  since quitting: 31.0  . Smokeless tobacco: Never Used  Vaping Use  . Vaping Use: Never used  Substance Use Topics  . Alcohol use: Yes  . Drug use: No    Prior to Admission medications   Medication Sig Start Date End Date Taking? Authorizing Provider  albuterol (PROVENTIL HFA;VENTOLIN HFA) 108 (90 Base) MCG/ACT inhaler Inhale 2 puffs into the lungs every 6 (six) hours as needed for shortness of breath.   Yes [provider]  Ascorbic Acid (VITAMIN C) 1000 MG tablet Take 1,000 mg by mouth daily.    Yes [provider]  aspirin EC 81 MG tablet Take 81 mg by mouth daily. Swallow whole.   Yes [provider]  calcium carbonate (OS-CAL - DOSED IN MG OF ELEMENTAL CALCIUM) 1250 (500 Ca) MG tablet Take 1 tablet by mouth daily.   Yes [provider]  Cholecalciferol (VITAMIN D3 PO) Take 500 Units by mouth daily.   Yes [provider]  clonazePAM (KLONOPIN) 0.5 MG tablet Take 0.5 mg by mouth daily as needed for anxiety.   Yes [provider]  estradiol (ESTRACE) 0.5 MG tablet Take 0.5 mg by mouth daily.   Yes [provider]  fluticasone furoate-vilanterol (BREO ELLIPTA) 100-25 MCG/INH AEPB Inhale 1 puff into the lungs daily. 08/17/18  Yes Wilhelmina Mcardle, MD  medroxyPROGESTERone (PROVERA) 2.5 MG tablet Take 2.5 mg  by mouth daily. 01/31/20  Yes [provider]  metoprolol tartrate (LOPRESSOR) 25 MG tablet Take 1 tablet (25 mg total) by mouth 2 (two) times daily. 02/22/20 05/22/20 Yes Agbor-Etang, Aaron Edelman, MD  quiNINE Orlin Hilding) 324 MG capsule Take 324 mg by mouth daily at 8 pm. 03/23/19  Yes [provider]  vitamin E 1000 UNIT capsule Take 1,000 Units by mouth daily.   Yes [provider]  XARELTO 10 MG TABS tablet Take 2 tablets (20 mg total) by mouth daily. Patient taking differently: No sig reported 01/02/20  Yes Barton Dubois, MD    Current Facility-Administered Medications  Medication Dose Route Frequency  Provider Last Rate Last Admin  . 0.9 %  sodium chloride infusion  250 mL Intravenous PRN Sharen Hones, MD      . acetaminophen (TYLENOL) tablet 650 mg  650 mg Oral Q4H PRN Sharen Hones, MD   650 mg at 02/28/20 1023  . albuterol (VENTOLIN HFA) 108 (90 Base) MCG/ACT inhaler 2 puff  2 puff Inhalation Q6H PRN Sharen Hones, MD   2 puff at 02/26/20 2113  . feeding supplement (ENSURE ENLIVE / ENSURE PLUS) liquid 237 mL  237 mL Oral BID BM Sharen Hones, MD   237 mL at 02/27/20 1400  . fluticasone furoate-vilanterol (BREO ELLIPTA) 100-25 MCG/INH 1 puff  1 puff Inhalation Daily Sharen Hones, MD   1 puff at 02/28/20 1024  . HYDROmorphone (DILAUDID) injection 0.5 mg  0.5 mg Intravenous Q6H PRN Sharen Hones, MD      . metoprolol tartrate (LOPRESSOR) tablet 75 mg  75 mg Oral BID Christell Faith M, PA-C   75 mg at 02/28/20 1023  . ondansetron (ZOFRAN) injection 4 mg  4 mg Intravenous Q6H PRN Sharen Hones, MD   4 mg at 02/28/20 1028  . piperacillin-tazobactam (ZOSYN) IVPB 3.375 g  3.375 g Intravenous Q8H Deatra Robinson B, RPH 12.5 mL/hr at 02/28/20 0628 3.375 g at 02/28/20 2863  . sodium chloride flush (NS) 0.9 % injection 3 mL  3 mL Intravenous Q12H Sharen Hones, MD   3 mL at 02/28/20 1024  . sodium chloride flush (NS) 0.9 % injection 3 mL  3 mL Intravenous PRN Sharen Hones, MD        Allergies as of 02/26/2020 - Review Complete 02/26/2020  Allergen Reaction Noted  . Pine tar Cough 07/20/2015  . Sulfa antibiotics Rash 09/25/2015  . Tetracyclines & related Rash 09/25/2015     Review of Systems:    All systems reviewed and negative except where noted in HPI.  Review of Systems  Constitutional: Positive for chills and malaise/fatigue.  Respiratory: Positive for shortness of breath.   Cardiovascular: Negative for chest pain.  Gastrointestinal: Positive for abdominal pain and nausea. Negative for blood in stool, constipation, diarrhea, melena and vomiting.  Skin: Negative for rash.  Neurological: Negative  for focal weakness.  Psychiatric/Behavioral: Negative for substance abuse.  All other systems reviewed and are negative.     Physical Exam:  Vital signs in last 24 hours: Temp:  [97.6 F (36.4 C)-98.3 F (36.8 C)] 98.3 F (36.8 C) (12/27 1145) Pulse Rate:  [81-112] 81 (12/27 1145) Resp:  [17-19] 18 (12/27 1145) BP: (103-151)/(79-92) 115/81 (12/27 1145) SpO2:  [90 %-97 %] 95 % (12/27 1145) Weight:  [75.3 kg] 75.3 kg (12/27 0417)   General:   In mild distress Head:  Normocephalic and atraumatic. Eyes:   No icterus.   Conjunctiva pink. Mouth: Mucosa pink moist, no lesions. Neck:  Supple;  no masses felt Lungs:  No respiratory distress Heart:  Irregular rhythm Abdomen:   RUQ tenderness Msk:  No significant edema Neurologic:  Alert and  oriented x2;  No other focal deficits  Skin:  Slightly cool lower extremities Psych:  Cooperative  LAB RESULTS: Recent Labs    02/26/20 1552 02/28/20 0357  WBC 13.6* 17.9*  HGB 11.4* 11.1*  HCT 37.2 34.1*  PLT 621* 610*   BMET Recent Labs    02/26/20 1234 02/27/20 0344 02/28/20 0357  NA 130* 129* 126*  K 3.6 3.3* 4.6  CL 90* 90* 90*  CO2 26 25 24   GLUCOSE 118* 119* 107*  BUN 28* 31* 42*  CREATININE 1.40* 1.46* 1.63*  CALCIUM 9.4 9.0 9.1   LFT Recent Labs    02/28/20 0357  PROT 6.1*  ALBUMIN 3.1*  AST 1,059*  ALT 737*  ALKPHOS 101  BILITOT 1.7*  BILIDIR 0.8*  IBILI 0.9   PT/INR No results for input(s): LABPROT, INR in the last 72 hours.  STUDIES: CT ABDOMEN PELVIS WO CONTRAST  Result Date: 02/28/2020 CLINICAL DATA:  Leukocytosis. EXAM: CT ABDOMEN AND PELVIS WITHOUT CONTRAST TECHNIQUE: Multidetector CT imaging of the abdomen and pelvis was performed following the standard protocol without IV contrast. COMPARISON:  January 03, 2020. FINDINGS: Lower chest: Moderate bilateral pleural effusions are noted, right greater than left, with associated right basilar atelectasis or infiltrate. Hepatobiliary: Cholelithiasis is  noted. No biliary dilatation is noted. Several hepatic cysts are noted and stable. Pancreas: Unremarkable. No pancreatic ductal dilatation or surrounding inflammatory changes. Spleen: Calcified splenic granulomata are again noted. There appears to be interval development of fluid density measuring 5.7 x 4.4 cm seen involving the superior aspect of the spleen. This is concerning for possible abscess or perisplenic hematoma. Adrenals/Urinary Tract: Adrenal glands are unremarkable. Kidneys are normal, without renal calculi, focal lesion, or hydronephrosis. Bladder is unremarkable. Stomach/Bowel: Stomach appears normal. There is no evidence of bowel obstruction or inflammation. The appendix is not visualized, but no inflammation is noted in the right lower quadrant. Vascular/Lymphatic: Aortic atherosclerosis. No enlarged abdominal or pelvic lymph nodes. Reproductive: Intrauterine device is noted. No adnexal abnormality is noted. Other: No abdominal wall hernia or abnormality. No abdominopelvic ascites. Musculoskeletal: No acute or significant osseous findings. IMPRESSION: 1. Moderate bilateral pleural effusions are noted, right greater than left, with associated right basilar atelectasis or infiltrate. 2. Interval development of 5.7 x 4.4 cm fluid density seen involving the superior aspect of the spleen concerning for possible abscess or perisplenic hematoma. CT scan of the abdomen with intravenous contrast is recommended for further evaluation. 3. Cholelithiasis. 4. Aortic atherosclerosis. Aortic Atherosclerosis (ICD10-I70.0). Electronically Signed   By: Marijo Conception M.D.   On: 02/28/2020 09:15       Impression / Plan:   74 y/o lady with history of severe covid with HFrEF (EF 20-25%) and history of DVT's here with abdominal pain, nausea, vomiting, and hepatocellular liver injury without convincing evidence of cholecystitis. There's a broad differential including infection (possible liver abscess?), ischemic  hepatopathy due to HFrEF although less likely given no hypotension, autoimmune, etc. Less likely to be cholangitis given normal alk phos and bilirubin. Needs further work-up.  - will need viral hepatitis panel (ordered) - will need abdominal u/s with dopplers (ordered) - will need contrasted imaging, preferably MRI if kidney function improves - can check non-contrasted MRCP in the mean time (ordered) - agree with empiric antibiotics and infectious work-up including blood cultures - if develops hypotension, will  need to consider the possibility of cardiogenic shock - daily cmp and INR  Close monitoring. Will continue to follow. Please call with any questions or concerns.  Raylene Miyamoto MD, MPH [place]

## 2020-02-28 NOTE — Progress Notes (Signed)
Progress Note  Patient Name: Paula Klein Date of Encounter: 02/28/2020  Primary Cardiologist: Agbor-Etang  Subjective   Agitated this morning.  Mittens in place.  Denies chest pain or dyspnea.  Inpatient Medications    Scheduled Meds: . estradiol  0.5 mg Oral Daily  . feeding supplement  237 mL Oral BID BM  . fluticasone furoate-vilanterol  1 puff Inhalation Daily  . furosemide  40 mg Intravenous Q12H  . metoprolol tartrate  75 mg Oral BID  . sodium chloride flush  3 mL Intravenous Q12H   Continuous Infusions: . sodium chloride    . piperacillin-tazobactam (ZOSYN)  IV 3.375 g (02/28/20 [id number])   PRN Meds: sodium chloride, acetaminophen, albuterol, ondansetron (ZOFRAN) IV, sodium chloride flush   Vital Signs    Vitals:   02/27/20 2000 02/27/20 2009 02/28/20 0417 02/28/20 0800  BP:  (!) 116/91 133/88 (!) 151/92  Pulse:  83 91 (!) 112  Resp:  17 19 17   Temp:  97.6 F (36.4 C) 97.9 F (36.6 C) 98.2 F (36.8 C)  TempSrc:  Oral Oral Axillary  SpO2: 92% 91% 90% 91%  Weight:   75.3 kg   Height:        Intake/Output Summary (Last 24 hours) at 02/28/2020 0816 Last data filed at 02/28/2020 0418 Gross per 24 hour  Intake 409.24 ml  Output 450 ml  Net -40.76 ml   Filed Weights   02/27/20 0251 02/28/20 0417  Weight: 71.7 kg 75.3 kg    Telemetry    Afib, low 100s to 120s bpm - Personally Reviewed  ECG    No new tracings - Personally Reviewed  Physical Exam   GEN: No acute distress. Mittens in place.   Neck: No JVD. Cardiac: Mildly tachycardic, IRIR, II/VI systolic murmur, no rubs, or gallops.  Respiratory: Clear to auscultation bilaterally.  GI: Soft, nontender, non-distended.   MS: No edema; No deformity. Neuro:  Alert; Nonfocal.  Psych: Agitated.  Labs    Chemistry Recent Labs  Lab 02/26/20 1234 02/27/20 0344 02/28/20 0357  NA 130* 129* 126*  K 3.6 3.3* 4.6  CL 90* 90* 90*  CO2 26 25 24   GLUCOSE 118* 119* 107*  BUN 28* 31* 42*   CREATININE 1.40* 1.46* 1.63*  CALCIUM 9.4 9.0 9.1  PROT 7.0  --  6.1*  ALBUMIN 3.4*  --  3.1*  AST 398*  --  1,059*  ALT 276*  --  737*  ALKPHOS 95  --  101  BILITOT 1.8*  --  1.7*  GFRNONAA 39* 38* 33*  ANIONGAP 14 14 12      Hematology Recent Labs  Lab 02/26/20 1552 02/28/20 0357  WBC 13.6* 17.9*  RBC 4.72 4.45  HGB 11.4* 11.1*  HCT 37.2 34.1*  MCV 78.8* 76.6*  MCH 24.2* 24.9*  MCHC 30.6 32.6  RDW 16.1* 16.4*  PLT 621* 610*    Cardiac EnzymesNo results for input(s): TROPONINI in the last 168 hours. No results for input(s): TROPIPOC in the last 168 hours.   BNP Recent Labs  Lab 02/26/20 1234  BNP 1,132.2*     DDimer No results for input(s): DDIMER in the last 168 hours.   Radiology    DG Chest Portable 1 View  Result Date: 02/26/2020 IMPRESSION: Small bilateral pleural effusions, left greater than right. Associated bibasilar opacities, may represent atelectasis and/or pneumonia. Electronically Signed   By: Davina Poke D.O.   On: 02/26/2020 12:59   US Abdomen Limited RUQ (LIVER/GB)  Result Date: 02/26/2020 IMPRESSION: 1. Multiple gallstones and positive sonographic Murphy's sign. Evaluate for acute cholecystitis. 2. Normal common bile duct. 3. RIGHT effusion and trace ascites. Electronically Signed   By: Genevive Bi M.D.   On: 02/26/2020 13:19    Cardiac Studies   2D echo 02/22/2020: 1. Left ventricular ejection fraction, by estimation, is 20 to 25%. The  left ventricle has severely decreased function. The left ventricle has no  regional wall motion abnormalities. Left ventricular diastolic parameters  are indeterminate.  2. Right ventricular systolic function is mildly reduced. The right  ventricular size is mildly enlarged. There is mildly elevated pulmonary  artery systolic pressure. The estimated right ventricular systolic  pressure is 34.9 mmHg.  3. Left atrial size was moderately dilated.  4. Right atrial size was moderately dilated.   5. A small pericardial effusion is present. The pericardial effusion is  posterior to the left ventricle. There is no evidence of cardiac  tamponade. Moderate pleural effusion in the left lateral region.  6. The mitral valve is normal in structure. Moderate mitral valve  regurgitation. No evidence of mitral stenosis.  7. The aortic valve is normal in structure. Aortic valve regurgitation is  not visualized. No aortic stenosis is present.  8. The inferior vena cava is dilated in size with <50% respiratory  variability, suggesting right atrial pressure of 15 mmHg.  Patient Profile     74 y.o. female with history of HFrEF, Afib, moderate mitral regurgitation, recent Covid infection in 12/2019, DVT, and COPD with prior tobacco use admitted with acute cholecystitis who we are seeing for HFrEF and Afib.   Assessment & Plan    1. HFrEF: -She does appear to continue to be volume overloaded though cannot exclude some degree of third spacing with hypoalbuminemia -Stop diltiazem gtt given her cardiomyopathy -Once she is improved from her acute illness she will need further evaluation of her cardiomyopathy to determine ischemic versus nonischemic -Strict I's and O's -Daily weights  2. Persistent Afib with RVR: -She remains in Afib with RVR with ventricular rates in the low 100s to 120s bpm -Titrate metoprolol to 75 mg twice daily -Stop diltiazem drip given her cardiomyopathy -Eliquis 5 mg twice daily given her CHA2DS2-VASc of 4 which is currently on hold while awaiting potential percutaneous cholecystostomy -Once she has improved from her acute illness, if she remains in A. fib, once she has been adequately anticoagulated without interruption for 3 to 4 weeks consider outpatient DCCV  3. Preoperative cardiac risk stratification: -Not felt to be a good surgical candidate for cholecystectomy given her multiple comorbid conditions, from a cardiac perspective, this can be reassessed at a later  date once her condition improves -Acceptable risk for percutaneous cholecystostomy as indicated per surgical team  4. AKI: -Hold Lasix  For questions or updates, please contact [facility] Please consult [email or web address] for contact info under Cardiology/STEMI.    Signed, Eula Listen, PA-C [facility] Pager: [phone] 02/28/2020, 8:16 AM

## 2020-02-28 NOTE — Progress Notes (Signed)
After receiving ativan at 2030 for anxiety, became confused, hysterical and crying uncontrollably wanting to go home. Also trying to get OOB and pulled two IV's out. Notified E. Ouma NP and received new order for Haldol 5mg  IVP. Haldol 5mg  IVP given.

## 2020-02-28 NOTE — Progress Notes (Signed)
PT Cancellation Note  Patient Details Name: Paula Klein MRN: [id number] DOB: March 30, 1945   Cancelled Treatment:    Reason Eval/Treat Not Completed: Patient declined, no reason specified.  Therapist chart reviewed pt and was clear to be seen.  Upon arrival to room RN requested therapist to attempt later to see pt.  Will attempt evaluation at later time/date as medically appropriate.   Gwenlyn Saran, PT, DPT 02/28/20, 11:06 AM

## 2020-02-29 ENCOUNTER — Inpatient Hospital Stay: Payer: Medicare HMO

## 2020-02-29 DIAGNOSIS — K8000 Calculus of gallbladder with acute cholecystitis without obstruction: Secondary | ICD-10-CM | POA: Diagnosis not present

## 2020-02-29 DIAGNOSIS — N179 Acute kidney failure, unspecified: Secondary | ICD-10-CM | POA: Diagnosis not present

## 2020-02-29 DIAGNOSIS — I480 Paroxysmal atrial fibrillation: Secondary | ICD-10-CM | POA: Diagnosis not present

## 2020-02-29 LAB — BASIC METABOLIC PANEL
Anion gap: 11 (ref 5–15)
BUN: 38 mg/dL — ABNORMAL HIGH (ref 8–23)
CO2: 27 mmol/L (ref 22–32)
Calcium: 9.2 mg/dL (ref 8.9–10.3)
Chloride: 92 mmol/L — ABNORMAL LOW (ref 98–111)
Creatinine, Ser: 1.24 mg/dL — ABNORMAL HIGH (ref 0.44–1.00)
GFR, Estimated: 46 mL/min — ABNORMAL LOW (ref >60)
Glucose, Bld: 107 mg/dL — ABNORMAL HIGH (ref 70–99)
Potassium: 4.2 mmol/L (ref 3.5–5.1)
Sodium: 130 mmol/L — ABNORMAL LOW (ref 135–145)

## 2020-02-29 LAB — HEPATIC FUNCTION PANEL
ALT: 534 U/L — ABNORMAL HIGH (ref 0–44)
AST: 480 U/L — ABNORMAL HIGH (ref 15–41)
Albumin: 3 g/dL — ABNORMAL LOW (ref 3.5–5.0)
Alkaline Phosphatase: 93 U/L (ref 38–126)
Bilirubin, Direct: 0.8 mg/dL — ABNORMAL HIGH (ref 0.0–0.2)
Indirect Bilirubin: 1.2 mg/dL — ABNORMAL HIGH (ref 0.3–0.9)
Total Bilirubin: 2 mg/dL — ABNORMAL HIGH (ref 0.3–1.2)
Total Protein: 6.2 g/dL — ABNORMAL LOW (ref 6.5–8.1)

## 2020-02-29 LAB — CBC WITH DIFFERENTIAL/PLATELET
Abs Immature Granulocytes: 0.14 10*3/uL — ABNORMAL HIGH (ref 0.00–0.07)
Basophils Absolute: 0 10*3/uL (ref 0.0–0.1)
Basophils Relative: 0 %
Eosinophils Absolute: 0 10*3/uL (ref 0.0–0.5)
Eosinophils Relative: 0 %
HCT: 34.9 % — ABNORMAL LOW (ref 36.0–46.0)
Hemoglobin: 10.7 g/dL — ABNORMAL LOW (ref 12.0–15.0)
Immature Granulocytes: 1 %
Lymphocytes Relative: 11 %
Lymphs Abs: 1.8 10*3/uL (ref 0.7–4.0)
MCH: 24 pg — ABNORMAL LOW (ref 26.0–34.0)
MCHC: 30.7 g/dL (ref 30.0–36.0)
MCV: 78.4 fL — ABNORMAL LOW (ref 80.0–100.0)
Monocytes Absolute: 1.5 10*3/uL — ABNORMAL HIGH (ref 0.1–1.0)
Monocytes Relative: 9 %
Neutro Abs: 12.6 10*3/uL — ABNORMAL HIGH (ref 1.7–7.7)
Neutrophils Relative %: 79 %
Platelets: 611 10*3/uL — ABNORMAL HIGH (ref 150–400)
RBC: 4.45 MIL/uL (ref 3.87–5.11)
RDW: 16.2 % — ABNORMAL HIGH (ref 11.5–15.5)
WBC: 16.1 10*3/uL — ABNORMAL HIGH (ref 4.0–10.5)
nRBC: 1.2 % — ABNORMAL HIGH (ref 0.0–0.2)

## 2020-02-29 LAB — PROTIME-INR
INR: 2.1 — ABNORMAL HIGH (ref 0.8–1.2)
Prothrombin Time: 22.7 seconds — ABNORMAL HIGH (ref 11.4–15.2)

## 2020-02-29 LAB — MAGNESIUM: Magnesium: 2.3 mg/dL (ref 1.7–2.4)

## 2020-02-29 MED ORDER — DIGOXIN 0.25 MG/ML IJ SOLN
0.2500 mg | Freq: Once | INTRAMUSCULAR | Status: AC
  Administered 2020-02-29: 08:00:00 0.25 mg via INTRAVENOUS
  Filled 2020-02-29: qty 2

## 2020-02-29 MED ORDER — FUROSEMIDE 10 MG/ML IJ SOLN
20.0000 mg | Freq: Once | INTRAMUSCULAR | Status: AC
  Administered 2020-02-29: 10:00:00 20 mg via INTRAVENOUS
  Filled 2020-02-29: qty 2

## 2020-02-29 NOTE — Progress Notes (Signed)
Progress Note  Patient Name: Paula Klein Date of Encounter: 02/29/2020  Primary Cardiologist: Agbor-Etang  Subjective   Denies chest pain or dyspnea. Family at bedside. Underwent MRCP last evening which did not show choledocholithiasis. Pending repeat ultrasound today to evaluate her gallbladder to see if this is amenable to percutaneous cholecystostomy. Renal function improving. She remains in A. fib with RVR with ventricular rates predominantly in the low 100s to 130s bpm.  Inpatient Medications    Scheduled Meds:  feeding supplement  237 mL Oral BID BM   fluticasone furoate-vilanterol  1 puff Inhalation Daily   furosemide  20 mg Intravenous Once   metoprolol tartrate  75 mg Oral BID   sodium chloride flush  3 mL Intravenous Q12H   Continuous Infusions:  sodium chloride     piperacillin-tazobactam (ZOSYN)  IV 3.375 g (02/29/20 0604)   PRN Meds: sodium chloride, acetaminophen, albuterol, HYDROmorphone (DILAUDID) injection, ondansetron (ZOFRAN) IV, sodium chloride flush   Vital Signs    Vitals:   02/28/20 1541 02/28/20 1957 02/28/20 2320 02/29/20 0355  BP: 128/80 128/83 130/90 107/86  Pulse: 84 (!) 55 (!) 128 (!) 106  Resp: 18 18  17   Temp: 98.2 F (36.8 C) 97.6 F (36.4 C)  98.8 F (37.1 C)  TempSrc:      SpO2: 96% 98%  96%  Weight:    75.8 kg  Height:        Intake/Output Summary (Last 24 hours) at 02/29/2020 0841 Last data filed at 02/29/2020 0300 Gross per 24 hour  Intake 680 ml  Output 1525 ml  Net -845 ml   Filed Weights   02/27/20 0251 02/28/20 0417 02/29/20 0355  Weight: 71.7 kg 75.3 kg 75.8 kg    Telemetry    Afib, low 100s to 130s bpm, 5 beat run of NSVT - Personally Reviewed  ECG    No new tracings - Personally Reviewed  Physical Exam   GEN: No acute distress.   Neck: No JVD. Cardiac: Mildly tachycardic, IRIR, II/VI systolic murmur, no rubs, or gallops.  Respiratory:  Faintly diminished along the bilateral bases.  GI:  Soft, nontender, non-distended.   MS: No edema; No deformity. Neuro:  Alert and oriented x3; Nonfocal.  Psych: Normal affect, responds to questions appropriately.  Labs    Chemistry Recent Labs  Lab 02/26/20 1234 02/27/20 0344 02/28/20 0357 02/29/20 0617  NA 130* 129* 126* 130*  K 3.6 3.3* 4.6 4.2  CL 90* 90* 90* 92*  CO2 26 25 24 27   GLUCOSE 118* 119* 107* 107*  BUN 28* 31* 42* 38*  CREATININE 1.40* 1.46* 1.63* 1.24*  CALCIUM 9.4 9.0 9.1 9.2  PROT 7.0  --  6.1* 6.2*  ALBUMIN 3.4*  --  3.1* 3.0*  AST 398*  --  1,059* 480*  ALT 276*  --  737* 534*  ALKPHOS 95  --  101 93  BILITOT 1.8*  --  1.7* 2.0*  GFRNONAA 39* 38* 33* 46*  ANIONGAP 14 14 12 11      Hematology Recent Labs  Lab 02/26/20 1552 02/28/20 0357 02/29/20 0617  WBC 13.6* 17.9* 16.1*  RBC 4.72 4.45 4.45  HGB 11.4* 11.1* 10.7*  HCT 37.2 34.1* 34.9*  MCV 78.8* 76.6* 78.4*  MCH 24.2* 24.9* 24.0*  MCHC 30.6 32.6 30.7  RDW 16.1* 16.4* 16.2*  PLT 621* 610* 611*    Cardiac EnzymesNo results for input(s): TROPONINI in the last 168 hours. No results for input(s): TROPIPOC in the last  168 hours.   BNP Recent Labs  Lab 02/26/20 1234  BNP 1,132.2*     DDimer No results for input(s): DDIMER in the last 168 hours.   Radiology    DG Chest Portable 1 View  Result Date: 02/26/2020 IMPRESSION: Small bilateral pleural effusions, left greater than right. Associated bibasilar opacities, may represent atelectasis and/or pneumonia. Electronically Signed   By: Duanne Guess D.O.   On: 02/26/2020 12:59   US Abdomen Limited RUQ (LIVER/GB)  Result Date: 02/26/2020 IMPRESSION: 1. Multiple gallstones and positive sonographic Murphy's sign. Evaluate for acute cholecystitis. 2. Normal common bile duct. 3. RIGHT effusion and trace ascites. Electronically Signed   By: Genevive Bi M.D.   On: 02/26/2020 13:19    Cardiac Studies   2D echo 02/22/2020: 1. Left ventricular ejection fraction, by estimation, is 20 to  25%. The  left ventricle has severely decreased function. The left ventricle has no  regional wall motion abnormalities. Left ventricular diastolic parameters  are indeterminate.  2. Right ventricular systolic function is mildly reduced. The right  ventricular size is mildly enlarged. There is mildly elevated pulmonary  artery systolic pressure. The estimated right ventricular systolic  pressure is 34.9 mmHg.  3. Left atrial size was moderately dilated.  4. Right atrial size was moderately dilated.  5. A small pericardial effusion is present. The pericardial effusion is  posterior to the left ventricle. There is no evidence of cardiac  tamponade. Moderate pleural effusion in the left lateral region.  6. The mitral valve is normal in structure. Moderate mitral valve  regurgitation. No evidence of mitral stenosis.  7. The aortic valve is normal in structure. Aortic valve regurgitation is  not visualized. No aortic stenosis is present.  8. The inferior vena cava is dilated in size with <50% respiratory  variability, suggesting right atrial pressure of 15 mmHg.  Patient Profile     74 y.o. female with history of HFrEF, Afib, moderate mitral regurgitation, recent Covid infection in 12/2019, DVT, and COPD with prior tobacco use admitted with acute cholecystitis who we are seeing for HFrEF and Afib.   Assessment & Plan    1. HFrEF: -She does appear to continue to be volume overloaded though cannot exclude some degree of third spacing with hypoalbuminemia -Continue to avoid diltiazem given her cardiomyopathy -Give a one-time dose of IV Lasix 20 mg -Once she is improved from her acute illness she will need further evaluation of her cardiomyopathy to determine ischemic versus nonischemic -Strict I's and O's -Daily weights  2. Persistent Afib with RVR: -She remains in Afib with RVR with ventricular rates in the low 100s to 130s bpm -Give a one-time dose of IV digoxin 0.25  mg -Continue metoprolol tartrate 75 mg twice daily -Diltiazem is not a great option given her cardiomyopathy -Eliquis 5 mg twice daily given her CHA2DS2-VASc of 4 which is currently on hold while awaiting potential percutaneous cholecystostomy -If okay with surgery would recommend placing the patient on IV heparin until time for her percutaneous gallbladder drain -Once she has improved from her acute illness, if she remains in A. fib, once she has been adequately anticoagulated without interruption for 3 to 4 weeks consider outpatient DCCV  3. Preoperative cardiac risk stratification: -Not felt to be a good surgical candidate for cholecystectomy given her multiple comorbid conditions, from a cardiac perspective, this can be reassessed at a later date once her condition improves -Acceptable risk for percutaneous cholecystostomy as indicated per surgical team  4. AKI: -  Improved  For questions or updates, please contact [place] Please consult [email or web address] for contact info under Cardiology/STEMI.    Signed, Christell Faith, PA-C [place] Pager: [phone] 02/29/2020, 8:41 AM

## 2020-02-29 NOTE — Progress Notes (Signed)
Mobility Specialist - Progress Note   02/29/20 1227  Mobility  Activity Ambulated in room  Level of Assistance Standby assist, set-up cues, supervision of patient - no hands on  Assistive Device Front wheel walker  Distance Ambulated (ft) 16 ft  Mobility Response Tolerated well  Mobility performed by Mobility specialist  $Mobility charge 1 Mobility    Pre-mobility: 115 HR, 96% SpO2 During mobility: 104 HR, 94% SpO2 Post-mobility: 115 HR, 97% SpO2   Pt laying in bed w/ HOB elevated upon arrival. Husband present in room. Pt agreed to session. Pt able to get to EOB w/ SBA. Pt dangled EOB for ~ 5 minutes, while mobility specialist set-up room. Pt S2S to RW w/ SBA. Pt ambulated ~ 16' total in room SBA. No LOB noted. NO c/o pain or SOB. Pt ambulated w/ RW at baseline. Noted O2 desat to 93-94% during ambulation. Pt on 4L O2 [place]. Dr entered room during session and observed pt ambulating. Pt ambulated to recliner after session. Per Dr request, weaned pt to 3L O2 [place]. Pt O2 sat 97% at the end of session. Overall, pt tolerated session well. Pt pleasant. A&O x 4. Pt left sitting on recliner w/ alarm set and husband present in room. All needs placed in reach. Nurse was notified.     Leveon Pelzer Mobility Specialist  02/29/20, 12:33 PM

## 2020-02-29 NOTE — Progress Notes (Signed)
[facility] SURGICAL PROGRESS NOTE (cpt [phone])  Hospital Day(s): 3.   Interval History: Patient seen and examined, no acute events or new complaints overnight. She is much more alert and oriented this morning. Patient reports her RUQ soreness is improved, denies fever, chills, nausea, emesis. Leukocytosis is only mildly improved this morning to 16.1K. sCr improved to 1.24. Hyponatremia is also improving, now 130. AST/ALT improved to 480/534. Hyperbilirubinemia is slightly worse to 2.0 (primarily mixed). She did undergo MRCP last night and this is pending. She is also pending follow up US. Tolerated CLD yesterday and NPO this morning.   Review of Systems:  Constitutional: denies fever, chills  HEENT: denies cough or congestion  Respiratory: denies any shortness of breath  Cardiovascular: denies chest pain or palpitations  Gastrointestinal: + abdominal pain (improved), denied N/V, or diarrhea/and bowel function as per interval history Genitourinary: denies burning with urination or urinary frequency  Vital signs in last 24 hours: [min-max] current  Temp:  [97.6 F (36.4 C)-98.8 F (37.1 C)] 98.8 F (37.1 C) (12/28 0355) Pulse Rate:  [55-128] 106 (12/28 0355) Resp:  [17-18] 17 (12/28 0355) BP: (107-151)/(80-92) 107/86 (12/28 0355) SpO2:  [91 %-98 %] 96 % (12/28 0355) Weight:  [75.8 kg] 75.8 kg (12/28 0355)     Height: 5\' 1"  (154.9 cm) Weight: 75.8 kg BMI (Calculated): 31.57   Intake/Output last 2 shifts:  12/27 0701 - 12/28 0700 In: 680 [P.O.:680] Out: 1525 [Urine:1525]   Physical Exam:  Constitutional: alert, cooperative and no distress  HENT: normocephalic without obvious abnormality  Eyes: PERRL, EOM's grossly intact and symmetric  Respiratory: on [place], short shallow breathing Cardiovascular: regular rate, irregularly irregular  Gastrointestinal: Soft, RUQ soreness is improved, non-distended, no rebound/guarding Musculoskeletal: UE and LE FROM, + edema   Labs:   CBC Latest Ref Rng & Units 02/29/2020 02/28/2020 02/26/2020  WBC 4.0 - 10.5 K/uL 16.1(H) 17.9(H) 13.6(H)  Hemoglobin 12.0 - 15.0 g/dL 10.7(L) 11.1(L) 11.4(L)  Hematocrit 36.0 - 46.0 % 34.9(L) 34.1(L) 37.2  Platelets 150 - 400 K/uL 611(H) 610(H) 621(H)   CMP Latest Ref Rng & Units 02/29/2020 02/28/2020 02/27/2020  Glucose 70 - 99 mg/dL 02/29/2020) 191(Y) 782(N)  BUN 8 - 23 mg/dL 562(Z) 30(Q) 65(H)  Creatinine 0.44 - 1.00 mg/dL 84(O) 9.62(X) 5.28(U)  Sodium 135 - 145 mmol/L 130(L) 126(L) 129(L)  Potassium 3.5 - 5.1 mmol/L 4.2 4.6 3.3(L)  Chloride 98 - 111 mmol/L 92(L) 90(L) 90(L)  CO2 22 - 32 mmol/L 27 24 25   Calcium 8.9 - 10.3 mg/dL 9.2 9.1 9.0  Total Protein 6.5 - 8.1 g/dL - 6.1(L) -  Total Bilirubin 0.3 - 1.2 mg/dL - 1.7(H) -  Alkaline Phos 38 - 126 U/L - 101 -  AST 15 - 41 U/L - 1,059(H) -  ALT 0 - 44 U/L - 737(H) -     Imaging studies:  MRCP pending read, RUQ 1.32(G w/ doppler pending this morning    Assessment/Plan: (ICD-10's: K81.0) 74 y.o. female with leukocytosis, LFT elevation, and hyperbilirubinemia found to have cholelithiasis without definitive evidence of cholecystitis, complicated by pertinent comorbidities including atrial fibrillation on anticoagulation, CHF, history of COVID.   - Appreciate IR assistance with this case. She is pending repeat US and MRCP read this morning. Follow up on this +/- placement of percutaneous cholecystostomy tube placement  - Appreciate GI input; work up pending    - Continue NPO this morning for now   - Continue IV Abx (Zosyn)             -  Monitor abdominal examination             - Pain control prn; antiemetics prn             - Appreciate cardiology assistance             - She is unfortunately NOT a good surgical candidate given her comorbid conditions             -  Further management per primary service; we will follow    All of the above findings and recommendations were discussed with the patient, patient's family (at bedside), and  the medical team, and all of patient's and family's questions were answered to their expressed satisfaction.  -- Edison Simon, PA-C [facility] 02/29/2020, 7:25 AM [phone] M-F: 7am - 4pm

## 2020-02-29 NOTE — Progress Notes (Signed)
°   02/29/20 1230  Clinical Encounter Type  Visited With Patient and family together  Visit Type Initial;Spiritual support;Social support  Referral From Chaplain  Consult/Referral To Chaplain  While rounding unit I visited Pt. Pt was awake and talking. Pt boyfriend was in the room. Pt told me what she was going through and asked me if I could pray with her and her boyfriend. I prayed for both and told them I would follow up later.

## 2020-02-29 NOTE — Progress Notes (Signed)
GI Inpatient Follow-up Note  Subjective:  Patient seen and is doing much better than yesterday. MRCP negative for any retained stone. Labs pending from yesterday.  Scheduled Inpatient Medications:  . feeding supplement  237 mL Oral BID BM  . fluticasone furoate-vilanterol  1 puff Inhalation Daily  . metoprolol tartrate  75 mg Oral BID  . sodium chloride flush  3 mL Intravenous Q12H    Continuous Inpatient Infusions:   . sodium chloride    . piperacillin-tazobactam (ZOSYN)  IV Stopped (02/29/20 1019)    PRN Inpatient Medications:  sodium chloride, acetaminophen, albuterol, HYDROmorphone (DILAUDID) injection, ondansetron (ZOFRAN) IV, sodium chloride flush  Review of Systems:  Review of Systems  Constitutional: Negative for chills, fever and weight loss.  Gastrointestinal: Negative for abdominal pain, blood in stool and constipation.  Skin: Negative for itching and rash.  Neurological: Negative for focal weakness.  Psychiatric/Behavioral: Negative for substance abuse.  All other systems reviewed and are negative.     Physical Examination: BP (!) 140/105 (BP Location: Left Arm)   Pulse (!) 111   Temp 97.7 F (36.5 C) (Oral)   Resp 19   Ht 5\' 1"  (1.549 m)   Wt 75.8 kg   SpO2 97%   BMI 31.55 kg/m  Gen: NAD, alert and oriented x 4 HEENT: PEERLA Neck: supple Chest: no respiratory distress CV: irregular rhythm Abd: soft, non-tender Ext: trace edema Skin: no rash or lesions noted Lymph: no LAD  Data: Lab Results  Component Value Date   WBC 16.1 (H) 02/29/2020   HGB 10.7 (L) 02/29/2020   HCT 34.9 (L) 02/29/2020   MCV 78.4 (L) 02/29/2020   PLT 611 (H) 02/29/2020   Recent Labs  Lab 02/26/20 1552 02/28/20 0357 02/29/20 0617  HGB 11.4* 11.1* 10.7*   Lab Results  Component Value Date   NA 130 (L) 02/29/2020   K 4.2 02/29/2020   CL 92 (L) 02/29/2020   CO2 27 02/29/2020   BUN 38 (H) 02/29/2020   CREATININE 1.24 (H) 02/29/2020   Lab Results  Component  Value Date   ALT 534 (H) 02/29/2020   AST 480 (H) 02/29/2020   ALKPHOS 93 02/29/2020   BILITOT 2.0 (H) 02/29/2020   Recent Labs  Lab 02/29/20 0617  INR 2.1*   Assessment/Plan: Ms. Kangas is a 74 y.o. lady with multiple medical comorbidities and abnormal liver enzymes of unknown cause. Labs pending but has had a dramatic improvement from yesterday both in terms of lab numbers and clinically. INR is elevated but could be secondary to nutrition. MRCP negative for stones in the bile duct  Recommendations:  - follow-up labs - recheck INR in am - continue to trend liver enzymes  Will continue to follow, please call with any questions or concerns.   66 MD, MPH [facility] GI

## 2020-02-29 NOTE — TOC Progression Note (Signed)
Transition of Care [facility]) - Progression Note    Patient Details  Name: SEPHIRA ZELLMAN MRN: [id number] Date of Birth: 01/03/1946  [facility] of [facility]) CM/SW Contact  Maree Krabbe, LCSW Phone Number: 02/29/2020, 3:39 PM  Clinical Narrative:   Pt states she has a scale at home and understands to weigh herself daily and notify her doctor if there is weight gain.    Expected Discharge Plan: Home/Self Care (OT no f/u PT pdg.) Barriers to Discharge: Continued Medical Work up  Expected Discharge Plan and Services Expected Discharge Plan: Home/Self Care (OT no f/u PT pdg.)       Living arrangements for the past 2 months: Mobile Home                                       Social Determinants of Health (SDOH) Interventions    Readmission Risk Interventions Readmission Risk Prevention Plan 02/29/2020  Transportation Screening Complete  PCP or Specialist Appt within 5-7 Days Complete  Home Care Screening Complete  Medication Review (RN CM) Complete  Some recent data might be hidden

## 2020-02-29 NOTE — Care Management Important Message (Signed)
Important Message  Patient Details  Name: Paula Klein MRN: [id number] Date of Birth: Jan 04, 1946   Medicare Important Message Given:  Yes     Johnell Comings 02/29/2020, 11:03 AM

## 2020-02-29 NOTE — Progress Notes (Signed)
PROGRESS NOTE    Paula Klein  [id number] DOB: 11/18/1945 DOA: 02/26/2020 PCP: Marguerita Merles, MD   Chief complaint. Short of breath and abdominal pain Brief Narrative:  LindaFuquayis a 74 year old female with history ofDVTon chronic anticoagulation, essential hypertension who came to the hospital with multiple complaints including short of breath, palpitation, abdominal pain and dysphagia. Upon arriving the hospital, her creatinine was 1.4, sodium 130. AST 398, ALT 276, total protein 1.8, BNP 1132,procalcitonin level 0.12, chest x-ray showed bilateral lower lobe infiltrates, small pleural effusion. Right upper quadrant ultrasound showed cholecystitis with positive Murphy sign. Telemetry showed atrial fibrillation with rapid ventricular response. Troponin 15. Patient is started on IV Lasix for acute exacerbation of congestive heart failure, her most recent echocardiogram was performed last month with ejection fraction 20 to 25%. Patient is also started on Zosyn for acute cholecystitis with gallstone, seen by general surgery.    Assessment & Plan:   Principal Problem:   Acute HFrEF (heart failure with reduced ejection fraction) (HCC) Active Problems:   History of DVT of lower extremity   AF (paroxysmal atrial fibrillation) (HCC)   Cholecystitis with cholelithiasis   Dysphagia   Severe protein-calorie malnutrition (HCC)   AKI (acute kidney injury) ([place])   Hyponatremia   Elevated liver enzymes   Atrial fibrillation with rapid ventricular response ([place])  #1. Acute on chronic systolic congestive heart failure. Persistent atrial fibrillation with RVR. Patient volume status is better. Currently not on diuretics. Patient is on liquid diet, with restriction to 1200 mL/day. Patient is on metoprolol twice a day at 75 mg. Also giving a dose of digoxin IV. We will need to restart anticoagulation if patient no longer need any surgery.  2. Cholelithiasis with  cholecystitis. Elevated liver enzymes Patient has been followed by general surgery. Discussed with general surgery, patient condition seem to be better today. They decided to hold off on cholecystectomy. Patient currently does not have sepsis. Patient is also seen by GI for elevated liver enzymes, MRCP was performed, did not show any common bile duct obstruction. Patient has splenic lesion, with the possibility of hematoma.  #3 acute kidney injury. Hyponatremia. Conditions are improving. We will continue to follow.  4. Thrombocytosis. Reactive secondary to infection. Follow.        DVT prophylaxis: SCDS Code Status: Full Family Communication: Husband at bedside Disposition Plan:  .   Status is: Inpatient  Remains inpatient appropriate because:Inpatient level of care appropriate due to severity of illness   Dispo: The patient is from: Home              Anticipated d/c is to: Home              Anticipated d/c date is: 3 days              Patient currently is not medically stable to d/c.        I/O last 3 completed shifts: In: 680 [P.O.:680] Out: 1975 [id number] Total I/O In: 214.9 [IV Piggyback:214.9] Out: 1300 [Urine:1300]     Consultants:   Neurosurgery and cardiology.  Procedures: None  Antimicrobials:  Zosyn.  Subjective: Patient feels much better today, she was able to walk with physical therapist, she did not have any short of breath during while walking. Her oxygen saturation did not drop. Her abdominal pain seem to much improved today. No nausea vomiting, she is tolerating liquid diet per No fever or chills. No dysuria or hematuria.   Objective: Vitals:   02/29/20 0355  02/29/20 0931 02/29/20 1000 02/29/20 1119  BP: 107/86 (!) 139/97  (!) 140/105  Pulse: (!) 106 (!) 110  (!) 111  Resp: 17 (!) 21  19  Temp: 98.8 F (37.1 C) 97.9 F (36.6 C)  97.7 F (36.5 C)  TempSrc:  Oral  Oral  SpO2: 96% 98% 96% 97%  Weight: 75.8 kg     Height:         Intake/Output Summary (Last 24 hours) at 02/29/2020 1242 Last data filed at 02/29/2020 1045 Gross per 24 hour  Intake 414.91 ml  Output 2500 ml  Net -2085.09 ml   Filed Weights   02/27/20 0251 02/28/20 0417 02/29/20 0355  Weight: 71.7 kg 75.3 kg 75.8 kg    Examination:  General exam: Appears calm and comfortable  Respiratory system: Clear to auscultation. Respiratory effort normal. Cardiovascular system: Irregularly irregular and tachycardic. No JVD, murmurs, rubs, gallops or clicks. No pedal edema. Gastrointestinal system: Abdomen is RUQ distended, soft and nontender. No organomegaly or masses felt. Normal bowel sounds heard. Central nervous system: Alert and oriented. No focal neurological deficits. Extremities: Symmetric  Skin: No rashes, lesions or ulcers Psychiatry:  Mood & affect appropriate.     Data Reviewed: I have personally reviewed following labs and imaging studies  CBC: Recent Labs  Lab 02/26/20 1552 02/28/20 0357 02/29/20 0617  WBC 13.6* 17.9* 16.1*  NEUTROABS 9.5* 13.9* 12.6*  HGB 11.4* 11.1* 10.7*  HCT 37.2 34.1* 34.9*  MCV 78.8* 76.6* 78.4*  PLT 621* 610* 611*   Basic Metabolic Panel: Recent Labs  Lab 02/26/20 1234 02/27/20 0344 02/28/20 0357 02/29/20 0617  NA 130* 129* 126* 130*  K 3.6 3.3* 4.6 4.2  CL 90* 90* 90* 92*  CO2 26 25 24 27   GLUCOSE 118* 119* 107* 107*  BUN 28* 31* 42* 38*  CREATININE 1.40* 1.46* 1.63* 1.24*  CALCIUM 9.4 9.0 9.1 9.2  MG  --  2.0 2.1 2.3   GFR: Estimated Creatinine Clearance: 37.1 mL/min (A) (by C-G formula based on SCr of 1.24 mg/dL (H)). Liver Function Tests: Recent Labs  Lab 02/26/20 1234 02/28/20 0357 02/29/20 0617  AST 398* 1,059* 480*  ALT 276* 737* 534*  ALKPHOS 95 101 93  BILITOT 1.8* 1.7* 2.0*  PROT 7.0 6.1* 6.2*  ALBUMIN 3.4* 3.1* 3.0*   Recent Labs  Lab 02/26/20 1234  LIPASE 26   No results for input(s): AMMONIA in the last 168 hours. Coagulation Profile: Recent Labs  Lab  02/29/20 0617  INR 2.1*   Cardiac Enzymes: No results for input(s): CKTOTAL, CKMB, CKMBINDEX, TROPONINI in the last 168 hours. BNP (last 3 results) No results for input(s): PROBNP in the last 8760 hours. HbA1C: No results for input(s): HGBA1C in the last 72 hours. CBG: No results for input(s): GLUCAP in the last 168 hours. Lipid Profile: No results for input(s): CHOL, HDL, LDLCALC, TRIG, CHOLHDL, LDLDIRECT in the last 72 hours. Thyroid Function Tests: No results for input(s): TSH, T4TOTAL, FREET4, T3FREE, THYROIDAB in the last 72 hours. Anemia Panel: No results for input(s): VITAMINB12, FOLATE, FERRITIN, TIBC, IRON, RETICCTPCT in the last 72 hours. Sepsis Labs: Recent Labs  Lab 02/26/20 1234 02/28/20 1343  PROCALCITON 0.12  --   LATICACIDVEN  --  2.1*    Recent Results (from the past 240 hour(s))  Resp Panel by RT-PCR (Flu A&B, Covid) Nasopharyngeal Swab     Status: None   Collection Time: 02/26/20 12:43 PM   Specimen: Nasopharyngeal Swab; Nasopharyngeal(NP) swabs in vial transport medium  Result Value Ref Range Status   SARS Coronavirus 2 by RT PCR NEGATIVE NEGATIVE Final    Comment: (NOTE) SARS-CoV-2 target nucleic acids are NOT DETECTED.  The SARS-CoV-2 RNA is generally detectable in upper respiratory specimens during the acute phase of infection. The lowest concentration of SARS-CoV-2 viral copies this assay can detect is 138 copies/mL. A negative result does not preclude SARS-Cov-2 infection and should not be used as the sole basis for treatment or other patient management decisions. A negative result may occur with  improper specimen collection/handling, submission of specimen other than nasopharyngeal swab, presence of viral mutation(s) within the areas targeted by this assay, and inadequate number of viral copies(<138 copies/mL). A negative result must be combined with clinical observations, patient history, and epidemiological information. The expected result  is Negative.  Fact Sheet for Patients:  EntrepreneurPulse.com.au  Fact Sheet for Healthcare Providers:  IncredibleEmployment.be  This test is no t yet approved or cleared by the [place] FDA and  has been authorized for detection and/or diagnosis of SARS-CoV-2 by FDA under an Emergency Use Authorization (EUA). This EUA will remain  in effect (meaning this test can be used) for the duration of the COVID-19 declaration under Section 564(b)(1) of the Act, 21 U.S.C.section 360bbb-3(b)(1), unless the authorization is terminated  or revoked sooner.       Influenza A by PCR NEGATIVE NEGATIVE Final   Influenza B by PCR NEGATIVE NEGATIVE Final    Comment: (NOTE) The Xpert Xpress SARS-CoV-2/FLU/RSV plus assay is intended as an aid in the diagnosis of influenza from Nasopharyngeal swab specimens and should not be used as a sole basis for treatment. Nasal washings and aspirates are unacceptable for Xpert Xpress SARS-CoV-2/FLU/RSV testing.  Fact Sheet for Patients: EntrepreneurPulse.com.au  Fact Sheet for Healthcare Providers: IncredibleEmployment.be  This test is not yet approved or cleared by the [place] FDA and has been authorized for detection and/or diagnosis of SARS-CoV-2 by FDA under an Emergency Use Authorization (EUA). This EUA will remain in effect (meaning this test can be used) for the duration of the COVID-19 declaration under Section 564(b)(1) of the Act, 21 U.S.C. section 360bbb-3(b)(1), unless the authorization is terminated or revoked.  Performed at [facility], [address] [place], [place] [address]   CULTURE, BLOOD (ROUTINE X 2) w Reflex to ID Panel     Status: None (Preliminary result)   Collection Time: 02/28/20  1:43 PM   Specimen: BLOOD  Result Value Ref Range Status   Specimen Description BLOOD RIGHT ANTECUBITAL  Final   Special Requests   Final    BOTTLES DRAWN  AEROBIC AND ANAEROBIC Blood Culture adequate volume   Culture   Final    NO GROWTH < 24 HOURS Performed at [facility], [address] [place], [place] [address]    Report Status PENDING  Incomplete  CULTURE, BLOOD (ROUTINE X 2) w Reflex to ID Panel     Status: None (Preliminary result)   Collection Time: 02/28/20  1:44 PM   Specimen: BLOOD  Result Value Ref Range Status   Specimen Description BLOOD LEFT ANTECUBITAL  Final   Special Requests   Final    BOTTLES DRAWN AEROBIC AND ANAEROBIC Blood Culture adequate volume   Culture   Final    NO GROWTH < 24 HOURS Performed at [facility], [address], [place], [place] [address]    Report Status PENDING  Incomplete         Radiology Studies: CT ABDOMEN  PELVIS WO CONTRAST  Result Date: 02/28/2020 CLINICAL DATA:  Leukocytosis. EXAM: CT ABDOMEN AND PELVIS WITHOUT CONTRAST TECHNIQUE: Multidetector CT imaging of the abdomen and pelvis was performed following the standard protocol without IV contrast. COMPARISON:  January 03, 2020. FINDINGS: Lower chest: Moderate bilateral pleural effusions are noted, right greater than left, with associated right basilar atelectasis or infiltrate. Hepatobiliary: Cholelithiasis is noted. No biliary dilatation is noted. Several hepatic cysts are noted and stable. Pancreas: Unremarkable. No pancreatic ductal dilatation or surrounding inflammatory changes. Spleen: Calcified splenic granulomata are again noted. There appears to be interval development of fluid density measuring 5.7 x 4.4 cm seen involving the superior aspect of the spleen. This is concerning for possible abscess or perisplenic hematoma. Adrenals/Urinary Tract: Adrenal glands are unremarkable. Kidneys are normal, without renal calculi, focal lesion, or hydronephrosis. Bladder is unremarkable. Stomach/Bowel: Stomach appears normal. There is no evidence of bowel obstruction or inflammation. The appendix is not visualized, but no  inflammation is noted in the right lower quadrant. Vascular/Lymphatic: Aortic atherosclerosis. No enlarged abdominal or pelvic lymph nodes. Reproductive: Intrauterine device is noted. No adnexal abnormality is noted. Other: No abdominal wall hernia or abnormality. No abdominopelvic ascites. Musculoskeletal: No acute or significant osseous findings. IMPRESSION: 1. Moderate bilateral pleural effusions are noted, right greater than left, with associated right basilar atelectasis or infiltrate. 2. Interval development of 5.7 x 4.4 cm fluid density seen involving the superior aspect of the spleen concerning for possible abscess or perisplenic hematoma. CT scan of the abdomen with intravenous contrast is recommended for further evaluation. 3. Cholelithiasis. 4. Aortic atherosclerosis. Aortic Atherosclerosis (ICD10-I70.0). Electronically Signed   By: Lupita Raider M.D.   On: 02/28/2020 09:15   MR ABDOMEN MRCP WO CONTRAST  Result Date: 02/29/2020 CLINICAL DATA:  Gallstones with sonographic Murphy's sign. New splenic hypodense lesion on CT. EXAM: MRI ABDOMEN WITHOUT CONTRAST  (INCLUDING MRCP) TECHNIQUE: Multiplanar multisequence MR imaging of the abdomen was performed. Heavily T2-weighted images of the biliary and pancreatic ducts were obtained, and three-dimensional MRCP images were rendered by post processing. COMPARISON:  Multiple exams, including 02/28/2020 CT and 02/26/2020 ultrasound FINDINGS: Lower chest: Substantial bilateral pleural effusions with passive atelectasis. Mild cardiomegaly. Hepatobiliary: Multiple sharply defined T2 hyperintense hepatic lesions favoring cysts. Some such the 3.3 by 2.2 cm cystic lesion inferiorly in the right hepatic lobe on image 27 of series 3 may have associated faint internal septation. No obvious internal nodularity. Multiple gallstones measuring up to about 0.8 cm in diameter are subtle dependently in the gallbladder. Common bile duct up to 0.6 cm in diameter (upper normal  for age) with slightly abrupt distal truncation rather than conical tapering at the tip, but without a well-defined low signal intensity within the CBD to further favor choledocholithiasis. No intrahepatic biliary dilatation. Pancreas:  Unremarkable Spleen: 4.1 by 2.3 cm peripheral primarily fluid lesion with high T2 and intermediate to high T1 signal intensity peripherally in the spleen, with a low signal intensity somewhat irregular rim on diffusion-weighted images. Irregular similar signal in the anterior inferior spleen in a bandlike configuration. Medially in the spleen there is a 2.3 by 2.1 cm region of reduced T1 and reduced T2 signal representing another splenic lesion, with a similar appearance inferiorly. Adrenals/Urinary Tract:  Unremarkable Stomach/Bowel: Unremarkable Vascular/Lymphatic:  Aortoiliac atherosclerotic vascular disease. Other: Bilateral flank edema in the subcutaneous tissues. Low-grade retroperitoneal edema. Musculoskeletal: Levoconvex lumbar scoliosis with spondylosis and degenerative disc disease. Nonspecific 8 mm T2 hyperintense lesion eccentric to the left in the L4 vertebral  body. IMPRESSION: 1. Cholelithiasis. No biliary dilatation or definite choledocholithiasis. There is slightly abrupt distal truncation of the common bile duct rather than conical tapering at the tip of the CBD, but without a well-defined low signal intensity CBD to further indicate choledocholithiasis. 2. Multiple hepatic cysts, some with mild septation. Strictly speaking, cystadenoma is not excluded. 3. Complex splenic lesions with high T2 and intermediate to high T1 signal intensity, and are new from 01/03/2020. Top differential diagnostic considerations include splenic hematomas (potentially in the setting of prior splenic infarcts) versus less likely splenic abscesses. No gas is identified in these lesions. Today's exam was not performed with IV contrast, which can reduced specificity. 4. Substantial bilateral  pleural effusions with passive atelectasis. 5. Mild cardiomegaly. 6. Nonspecific 8 mm T2 hyperintense lesion eccentric to the left in the L4 vertebral body. This could be an atypical hemangioma but is nonspecific. If the patient has a history of malignancy with predilection for bony spread such as breast or lung cancer, nuclear medicine bone scan may be helpful for further characterization. Electronically Signed   By: Van Clines M.D.   On: 02/29/2020 08:14   MR 3D Recon At Scanner  Result Date: 02/29/2020 CLINICAL DATA:  Gallstones with sonographic Murphy's sign. New splenic hypodense lesion on CT. EXAM: MRI ABDOMEN WITHOUT CONTRAST  (INCLUDING MRCP) TECHNIQUE: Multiplanar multisequence MR imaging of the abdomen was performed. Heavily T2-weighted images of the biliary and pancreatic ducts were obtained, and three-dimensional MRCP images were rendered by post processing. COMPARISON:  Multiple exams, including 02/28/2020 CT and 02/26/2020 ultrasound FINDINGS: Lower chest: Substantial bilateral pleural effusions with passive atelectasis. Mild cardiomegaly. Hepatobiliary: Multiple sharply defined T2 hyperintense hepatic lesions favoring cysts. Some such the 3.3 by 2.2 cm cystic lesion inferiorly in the right hepatic lobe on image 27 of series 3 may have associated faint internal septation. No obvious internal nodularity. Multiple gallstones measuring up to about 0.8 cm in diameter are subtle dependently in the gallbladder. Common bile duct up to 0.6 cm in diameter (upper normal for age) with slightly abrupt distal truncation rather than conical tapering at the tip, but without a well-defined low signal intensity within the CBD to further favor choledocholithiasis. No intrahepatic biliary dilatation. Pancreas:  Unremarkable Spleen: 4.1 by 2.3 cm peripheral primarily fluid lesion with high T2 and intermediate to high T1 signal intensity peripherally in the spleen, with a low signal intensity somewhat  irregular rim on diffusion-weighted images. Irregular similar signal in the anterior inferior spleen in a bandlike configuration. Medially in the spleen there is a 2.3 by 2.1 cm region of reduced T1 and reduced T2 signal representing another splenic lesion, with a similar appearance inferiorly. Adrenals/Urinary Tract:  Unremarkable Stomach/Bowel: Unremarkable Vascular/Lymphatic:  Aortoiliac atherosclerotic vascular disease. Other: Bilateral flank edema in the subcutaneous tissues. Low-grade retroperitoneal edema. Musculoskeletal: Levoconvex lumbar scoliosis with spondylosis and degenerative disc disease. Nonspecific 8 mm T2 hyperintense lesion eccentric to the left in the L4 vertebral body. IMPRESSION: 1. Cholelithiasis. No biliary dilatation or definite choledocholithiasis. There is slightly abrupt distal truncation of the common bile duct rather than conical tapering at the tip of the CBD, but without a well-defined low signal intensity CBD to further indicate choledocholithiasis. 2. Multiple hepatic cysts, some with mild septation. Strictly speaking, cystadenoma is not excluded. 3. Complex splenic lesions with high T2 and intermediate to high T1 signal intensity, and are new from 01/03/2020. Top differential diagnostic considerations include splenic hematomas (potentially in the setting of prior splenic infarcts) versus less likely splenic  abscesses. No gas is identified in these lesions. Today's exam was not performed with IV contrast, which can reduced specificity. 4. Substantial bilateral pleural effusions with passive atelectasis. 5. Mild cardiomegaly. 6. Nonspecific 8 mm T2 hyperintense lesion eccentric to the left in the L4 vertebral body. This could be an atypical hemangioma but is nonspecific. If the patient has a history of malignancy with predilection for bony spread such as breast or lung cancer, nuclear medicine bone scan may be helpful for further characterization. Electronically Signed   By: Van Clines M.D.   On: 02/29/2020 08:14   US LIVER DOPPLER  Result Date: 02/29/2020 CLINICAL DATA:  Evaluate hepatic vasculature. Evaluate for cholecystitis. EXAM: 1. ULTRASOUND ABDOMEN LIMITED RIGHT UPPER QUADRANT 2. DUPLEX ULTRASOUND OF LIVER TECHNIQUE: Standard right upper quadrant abdominal ultrasound was performed followed by color and duplex Doppler ultrasound to evaluate the hepatic in-flow and out-flow vessels. COMPARISON:  Right upper quadrant abdominal ultrasound-01/03/2020; 02/26/2020; CT abdomen pelvis-02/28/2020; MRCP-02/28/2020 FINDINGS: Gallbladder: Redemonstrated multiple echogenic layering gallstones within otherwise normal-appearing gallbladder. Index gallstone measures approximately 1.1 cm in greatest short axis diameter. No gallbladder wall thickening or pericholecystic stranding. Evaluation for sonographic Percell Miller sign is degraded secondary to patient's mental status. Common bile duct: Diameter: Normal in size measuring 2.4 mm in diameter Liver: Mild nodularity of the hepatic contour. Note is made of 2 punctate (approximately 1.0 cm) anechoic cysts within the left lobe of the liver. Note is made of two partially septated, minimally complex cyst within the right lobe of the liver with dominant cyst measuring approximately 3.4 x 3.1 x 2.8 cm, better demonstrated on preceding MRCP. Other: Note is made of a moderate-sized right-sided pleural effusion. _________________________________________________________ Main Portal Vein size: 1.0 cm cm Portal Vein Velocities (normal hepatopetal directional flow) Main Prox:  33 cm/sec Main Mid: 18 cm/sec Main Dist:  19 cm/sec Right: 19 cm/sec Left: 19 cm/sec Hepatic Vein Velocities (normal hepatofugal directional flow) Right:  28 cm/sec Middle:  28 cm/sec Left:  49 cm/sec IVC: Present and patent with normal respiratory phasicity. Hepatic Artery Velocity: 99 cm/sec Splenic Vein Velocity: 40 cm/sec Spleen: 6.9 cm x 3.9 cm x 5.0 cm with a total volume of 67 cm^3  (411 cm^3 is upper limit normal) Portal Vein Occlusion/Thrombus: No Splenic Vein Occlusion/Thrombus: No Ascites: None Varices: None IMPRESSION: 1. Patent hepatic vasculature with normal directional flow. 2. Cholelithiasis without sonographic evidence of acute cholecystitis though evaluation for sonographic Murphy sign degraded secondary to altered mental status. If there remains clinical concern for acute cholecystitis, further evaluation nuclear medicine HIDA scan could be performed as indicated. 3. Scattered hepatic cysts, several of which appear minimally complex/partially septated, better evaluated on preceding MRCP. 4. Moderate sized right-sided pleural effusion as demonstrated on preceding abdominal CT. Electronically Signed   By: Sandi Mariscal M.D.   On: 02/29/2020 10:02   US Abdomen Limited RUQ (LIVER/GB)  Result Date: 02/29/2020 CLINICAL DATA:  Evaluate hepatic vasculature. Evaluate for cholecystitis. EXAM: 1. ULTRASOUND ABDOMEN LIMITED RIGHT UPPER QUADRANT 2. DUPLEX ULTRASOUND OF LIVER TECHNIQUE: Standard right upper quadrant abdominal ultrasound was performed followed by color and duplex Doppler ultrasound to evaluate the hepatic in-flow and out-flow vessels. COMPARISON:  Right upper quadrant abdominal ultrasound-01/03/2020; 02/26/2020; CT abdomen pelvis-02/28/2020; MRCP-02/28/2020 FINDINGS: Gallbladder: Redemonstrated multiple echogenic layering gallstones within otherwise normal-appearing gallbladder. Index gallstone measures approximately 1.1 cm in greatest short axis diameter. No gallbladder wall thickening or pericholecystic stranding. Evaluation for sonographic Percell Miller sign is degraded secondary to patient's mental status. Common bile duct: Diameter: Normal  in size measuring 2.4 mm in diameter Liver: Mild nodularity of the hepatic contour. Note is made of 2 punctate (approximately 1.0 cm) anechoic cysts within the left lobe of the liver. Note is made of two partially septated, minimally complex  cyst within the right lobe of the liver with dominant cyst measuring approximately 3.4 x 3.1 x 2.8 cm, better demonstrated on preceding MRCP. Other: Note is made of a moderate-sized right-sided pleural effusion. _________________________________________________________ Main Portal Vein size: 1.0 cm cm Portal Vein Velocities (normal hepatopetal directional flow) Main Prox:  33 cm/sec Main Mid: 18 cm/sec Main Dist:  19 cm/sec Right: 19 cm/sec Left: 19 cm/sec Hepatic Vein Velocities (normal hepatofugal directional flow) Right:  28 cm/sec Middle:  28 cm/sec Left:  49 cm/sec IVC: Present and patent with normal respiratory phasicity. Hepatic Artery Velocity: 99 cm/sec Splenic Vein Velocity: 40 cm/sec Spleen: 6.9 cm x 3.9 cm x 5.0 cm with a total volume of 67 cm^3 (411 cm^3 is upper limit normal) Portal Vein Occlusion/Thrombus: No Splenic Vein Occlusion/Thrombus: No Ascites: None Varices: None IMPRESSION: 1. Patent hepatic vasculature with normal directional flow. 2. Cholelithiasis without sonographic evidence of acute cholecystitis though evaluation for sonographic Murphy sign degraded secondary to altered mental status. If there remains clinical concern for acute cholecystitis, further evaluation nuclear medicine HIDA scan could be performed as indicated. 3. Scattered hepatic cysts, several of which appear minimally complex/partially septated, better evaluated on preceding MRCP. 4. Moderate sized right-sided pleural effusion as demonstrated on preceding abdominal CT. Electronically Signed   By: Sandi Mariscal M.D.   On: 02/29/2020 10:02        Scheduled Meds: . feeding supplement  237 mL Oral BID BM  . fluticasone furoate-vilanterol  1 puff Inhalation Daily  . metoprolol tartrate  75 mg Oral BID  . sodium chloride flush  3 mL Intravenous Q12H   Continuous Infusions: . sodium chloride    . piperacillin-tazobactam (ZOSYN)  IV Stopped (02/29/20 1019)     LOS: 3 days    Time spent: 28 minutes    Sharen Hones, MD [facility]   To contact the attending provider between 7A-7P or the covering provider during after hours 7P-7A, please log into the web site [email or web address] and access using universal [place] password for that web site. If you do not have the password, please call the hospital operator.  02/29/2020, 12:42 PM

## 2020-03-01 DIAGNOSIS — R7401 Elevation of levels of liver transaminase levels: Secondary | ICD-10-CM

## 2020-03-01 DIAGNOSIS — I482 Chronic atrial fibrillation, unspecified: Secondary | ICD-10-CM

## 2020-03-01 DIAGNOSIS — I4891 Unspecified atrial fibrillation: Secondary | ICD-10-CM | POA: Diagnosis not present

## 2020-03-01 DIAGNOSIS — I5021 Acute systolic (congestive) heart failure: Secondary | ICD-10-CM | POA: Diagnosis not present

## 2020-03-01 DIAGNOSIS — I5023 Acute on chronic systolic (congestive) heart failure: Secondary | ICD-10-CM | POA: Diagnosis not present

## 2020-03-01 LAB — HEPATITIS B DNA, ULTRAQUANTITATIVE, PCR
HBV DNA SERPL PCR-ACNC: NOT DETECTED IU/mL
HBV DNA SERPL PCR-LOG IU: UNDETERMINED log10 IU/mL

## 2020-03-01 LAB — BASIC METABOLIC PANEL
Anion gap: 10 (ref 5–15)
BUN: 27 mg/dL — ABNORMAL HIGH (ref 8–23)
CO2: 30 mmol/L (ref 22–32)
Calcium: 9.1 mg/dL (ref 8.9–10.3)
Chloride: 96 mmol/L — ABNORMAL LOW (ref 98–111)
Creatinine, Ser: 0.79 mg/dL (ref 0.44–1.00)
GFR, Estimated: >60 mL/min (ref >60)
Glucose, Bld: 104 mg/dL — ABNORMAL HIGH (ref 70–99)
Potassium: 3.5 mmol/L (ref 3.5–5.1)
Sodium: 136 mmol/L (ref 135–145)

## 2020-03-01 LAB — HEPATIC FUNCTION PANEL
ALT: 412 U/L — ABNORMAL HIGH (ref 0–44)
AST: 279 U/L — ABNORMAL HIGH (ref 15–41)
Albumin: 2.9 g/dL — ABNORMAL LOW (ref 3.5–5.0)
Alkaline Phosphatase: 94 U/L (ref 38–126)
Bilirubin, Direct: 0.7 mg/dL — ABNORMAL HIGH (ref 0.0–0.2)
Indirect Bilirubin: 1.3 mg/dL — ABNORMAL HIGH (ref 0.3–0.9)
Total Bilirubin: 2 mg/dL — ABNORMAL HIGH (ref 0.3–1.2)
Total Protein: 6 g/dL — ABNORMAL LOW (ref 6.5–8.1)

## 2020-03-01 LAB — IGG 1, 2, 3, AND 4
IgG (Immunoglobin G), Serum: 1148 mg/dL (ref 586–1602)
IgG, Subclass 1: 547 mg/dL (ref 248–810)
IgG, Subclass 2: 422 mg/dL (ref 130–555)
IgG, Subclass 3: 62 mg/dL (ref 15–102)
IgG, Subclass 4: 24 mg/dL (ref 2–96)

## 2020-03-01 LAB — ANTI-MICROSOMAL ANTIBODY LIVER / KIDNEY: LKM1 Ab: 1.2 Units (ref 0.0–20.0)

## 2020-03-01 LAB — PROTIME-INR
INR: 1.5 — ABNORMAL HIGH (ref 0.8–1.2)
Prothrombin Time: 17.4 seconds — ABNORMAL HIGH (ref 11.4–15.2)

## 2020-03-01 LAB — CBC WITH DIFFERENTIAL/PLATELET
Abs Immature Granulocytes: 0.1 10*3/uL — ABNORMAL HIGH (ref 0.00–0.07)
Basophils Absolute: 0 10*3/uL (ref 0.0–0.1)
Basophils Relative: 0 %
Eosinophils Absolute: 0.1 10*3/uL (ref 0.0–0.5)
Eosinophils Relative: 0 %
HCT: 36.7 % (ref 36.0–46.0)
Hemoglobin: 11.4 g/dL — ABNORMAL LOW (ref 12.0–15.0)
Immature Granulocytes: 1 %
Lymphocytes Relative: 15 %
Lymphs Abs: 2.4 10*3/uL (ref 0.7–4.0)
MCH: 24.4 pg — ABNORMAL LOW (ref 26.0–34.0)
MCHC: 31.1 g/dL (ref 30.0–36.0)
MCV: 78.4 fL — ABNORMAL LOW (ref 80.0–100.0)
Monocytes Absolute: 1.4 10*3/uL — ABNORMAL HIGH (ref 0.1–1.0)
Monocytes Relative: 9 %
Neutro Abs: 11.7 10*3/uL — ABNORMAL HIGH (ref 1.7–7.7)
Neutrophils Relative %: 75 %
Platelets: 661 10*3/uL — ABNORMAL HIGH (ref 150–400)
RBC: 4.68 MIL/uL (ref 3.87–5.11)
RDW: 16.7 % — ABNORMAL HIGH (ref 11.5–15.5)
WBC: 15.6 10*3/uL — ABNORMAL HIGH (ref 4.0–10.5)
nRBC: 1.2 % — ABNORMAL HIGH (ref 0.0–0.2)

## 2020-03-01 LAB — HEPATITIS PANEL, ACUTE
HCV Ab: <0.1 s/co ratio — AB (ref 0.0–0.9)
Hep A IgM: NEGATIVE — AB
Hep B C IgM: NEGATIVE — AB
Hepatitis B Surface Ag: NEGATIVE — AB

## 2020-03-01 LAB — HEPARIN LEVEL (UNFRACTIONATED): Heparin Unfractionated: 0.59 IU/mL (ref 0.30–0.70)

## 2020-03-01 LAB — MAGNESIUM: Magnesium: 2 mg/dL (ref 1.7–2.4)

## 2020-03-01 LAB — HEPATITIS C VRS RNA DETECT BY PCR-QUAL: Hepatitis C Vrs RNA by PCR-Qual: NEGATIVE

## 2020-03-01 LAB — APTT
aPTT: 27 seconds (ref 24–36)
aPTT: 30 seconds (ref 24–36)

## 2020-03-01 LAB — ANTI-SMOOTH MUSCLE ANTIBODY, IGG: F-Actin IgG: 9 Units (ref 0–19)

## 2020-03-01 LAB — ANA W/REFLEX: Anti Nuclear Antibody (ANA): NEGATIVE

## 2020-03-01 MED ORDER — HEPARIN (PORCINE) 25000 UT/250ML-% IV SOLN
1350.0000 [IU]/h | INTRAVENOUS | Status: DC
  Administered 2020-03-01: 13:00:00 900 [IU]/h via INTRAVENOUS
  Filled 2020-03-01 (×2): qty 250

## 2020-03-01 MED ORDER — RIVAROXABAN 20 MG PO TABS: 20.0000 mg | ORAL_TABLET | Freq: Every day | ORAL | Status: DC

## 2020-03-01 MED ORDER — FUROSEMIDE 10 MG/ML IJ SOLN
20.0000 mg | Freq: Two times a day (BID) | INTRAMUSCULAR | Status: DC
  Administered 2020-03-01 – 2020-03-02 (×3): 20 mg via INTRAVENOUS
  Filled 2020-03-01 (×3): qty 2

## 2020-03-01 MED ORDER — HEPARIN BOLUS VIA INFUSION
1900.0000 [IU] | Freq: Once | INTRAVENOUS | Status: AC
  Administered 2020-03-01: 23:00:00 1900 [IU] via INTRAVENOUS
  Filled 2020-03-01: qty 1900

## 2020-03-01 MED ORDER — HEPARIN BOLUS VIA INFUSION
3200.0000 [IU] | Freq: Once | INTRAVENOUS | Status: AC
  Administered 2020-03-01: 13:00:00 3200 [IU] via INTRAVENOUS
  Filled 2020-03-01: qty 3200

## 2020-03-01 MED ORDER — METOPROLOL TARTRATE 50 MG PO TABS
100.0000 mg | ORAL_TABLET | Freq: Two times a day (BID) | ORAL | Status: DC
  Administered 2020-03-01 – 2020-03-02 (×3): 100 mg via ORAL
  Filled 2020-03-01 (×3): qty 2

## 2020-03-01 NOTE — Progress Notes (Addendum)
PROGRESS NOTE    Paula Klein  [id number] DOB: 14-Mar-1945 DOA: 02/26/2020 PCP: Marguerita Merles, MD    Assessment & Plan:   Principal Problem:   Acute HFrEF (heart failure with reduced ejection fraction) ([place]) Active Problems:   History of DVT of lower extremity   AF (paroxysmal atrial fibrillation) (HCC)   Cholecystitis with cholelithiasis   Dysphagia   Severe protein-calorie malnutrition (HCC)   AKI (acute kidney injury) ([place])   Hyponatremia   Elevated liver enzymes   Atrial fibrillation with rapid ventricular response (HCC)   Acute on chronic systolic CHF: continue on metoprolol, digoxin. Continue on IV lasix. Monitor I/Os and continue w/ fluid restriction.   Persistent a. fib: continue on metoprolol, digoxin. Continue w/ tele  Cholelithiasis with cholecystitis: will hold off on cholecystectomy as per general surg. Continue on IV zosyn. MRCP was performed, did not show any common bile duct obstruction but a splenic lesion, with the possibility of hematoma.  Leukocytosis: likely secondary to above. Continue on IV abxs  Transaminitis: likely secondary to cholelithiasis w/ cholecystitis. Still elevated but trending down   AKI: resolved  Hyponatremia: resolved   Thrombocytosis: likely reactive. Will continue to monitor     DVT prophylaxis: heparin  Code Status: full  Family Communication:  Disposition Plan: likely d/c back home w/ home health    Status is: Inpatient  Remains inpatient appropriate because:IV treatments appropriate due to intensity of illness or inability to take PO   Dispo: The patient is from: Home              Anticipated d/c is to: Home              Anticipated d/c date is: 2 days              Patient currently is not medically stable to d/c.        Consultants:   General surg   Cardio    Procedures:    Antimicrobials: zosyn    Subjective: Pt c/o malaise   Objective: Vitals:   02/29/20 2238 03/01/20 0416  03/01/20 0532 03/01/20 0720  BP:  (!) 128/94  (!) 145/97  Pulse: (!) 104 (!) 108  (!) 107  Resp:    18  Temp:  97.7 F (36.5 C)  (!) 97.5 F (36.4 C)  TempSrc:  Oral  Oral  SpO2: 97% 96%  96%  Weight:   70.8 kg   Height:        Intake/Output Summary (Last 24 hours) at 03/01/2020 0839 Last data filed at 03/01/2020 0532 Gross per 24 hour  Intake 864.91 ml  Output 2400 ml  Net -1535.09 ml   Filed Weights   02/28/20 0417 02/29/20 0355 03/01/20 0532  Weight: 75.3 kg 75.8 kg 70.8 kg    Examination:  General exam: Appears calm and comfortable  Respiratory system: decreased breath sounds b/l  Cardiovascular system: irregularly irregular. No rubs, gallops or clicks.  Gastrointestinal system: Abdomen is nondistended, soft and nontender. Normal bowel sounds heard. Central nervous system: Alert and oriented. Psychiatry: Judgement and insight appear normal. Flat mood and affect     Data Reviewed: I have personally reviewed following labs and imaging studies  CBC: Recent Labs  Lab 02/26/20 1552 02/28/20 0357 02/29/20 0617 03/01/20 0432  WBC 13.6* 17.9* 16.1* 15.6*  NEUTROABS 9.5* 13.9* 12.6* 11.7*  HGB 11.4* 11.1* 10.7* 11.4*  HCT 37.2 34.1* 34.9* 36.7  MCV 78.8* 76.6* 78.4* 78.4*  PLT 621* 610* 611* 661*  Basic Metabolic Panel: Recent Labs  Lab 02/26/20 1234 02/27/20 0344 02/28/20 0357 02/29/20 0617 03/01/20 0432  NA 130* 129* 126* 130* 136  K 3.6 3.3* 4.6 4.2 3.5  CL 90* 90* 90* 92* 96*  CO2 26 25 24 27 30   GLUCOSE 118* 119* 107* 107* 104*  BUN 28* 31* 42* 38* 27*  CREATININE 1.40* 1.46* 1.63* 1.24* 0.79  CALCIUM 9.4 9.0 9.1 9.2 9.1  MG  --  2.0 2.1 2.3 2.0   GFR: Estimated Creatinine Clearance: 55.5 mL/min (by C-G formula based on SCr of 0.79 mg/dL). Liver Function Tests: Recent Labs  Lab 02/26/20 1234 02/28/20 0357 02/29/20 0617 03/01/20 0432  AST 398* 1,059* 480* 279*  ALT 276* 737* 534* 412*  ALKPHOS 95 101 93 94  BILITOT 1.8* 1.7* 2.0* 2.0*   PROT 7.0 6.1* 6.2* 6.0*  ALBUMIN 3.4* 3.1* 3.0* 2.9*   Recent Labs  Lab 02/26/20 1234  LIPASE 26   No results for input(s): AMMONIA in the last 168 hours. Coagulation Profile: Recent Labs  Lab 02/29/20 0617 03/01/20 0432  INR 2.1* 1.5*   Cardiac Enzymes: No results for input(s): CKTOTAL, CKMB, CKMBINDEX, TROPONINI in the last 168 hours. BNP (last 3 results) No results for input(s): PROBNP in the last 8760 hours. HbA1C: No results for input(s): HGBA1C in the last 72 hours. CBG: No results for input(s): GLUCAP in the last 168 hours. Lipid Profile: No results for input(s): CHOL, HDL, LDLCALC, TRIG, CHOLHDL, LDLDIRECT in the last 72 hours. Thyroid Function Tests: No results for input(s): TSH, T4TOTAL, FREET4, T3FREE, THYROIDAB in the last 72 hours. Anemia Panel: No results for input(s): VITAMINB12, FOLATE, FERRITIN, TIBC, IRON, RETICCTPCT in the last 72 hours. Sepsis Labs: Recent Labs  Lab 02/26/20 1234 02/28/20 1343  PROCALCITON 0.12  --   LATICACIDVEN  --  2.1*    Recent Results (from the past 240 hour(s))  Resp Panel by RT-PCR (Flu A&B, Covid) Nasopharyngeal Swab     Status: None   Collection Time: 02/26/20 12:43 PM   Specimen: Nasopharyngeal Swab; Nasopharyngeal(NP) swabs in vial transport medium  Result Value Ref Range Status   SARS Coronavirus 2 by RT PCR NEGATIVE NEGATIVE Final    Comment: (NOTE) SARS-CoV-2 target nucleic acids are NOT DETECTED.  The SARS-CoV-2 RNA is generally detectable in upper respiratory specimens during the acute phase of infection. The lowest concentration of SARS-CoV-2 viral copies this assay can detect is 138 copies/mL. A negative result does not preclude SARS-Cov-2 infection and should not be used as the sole basis for treatment or other patient management decisions. A negative result may occur with  improper specimen collection/handling, submission of specimen other than nasopharyngeal swab, presence of viral mutation(s) within  the areas targeted by this assay, and inadequate number of viral copies(<138 copies/mL). A negative result must be combined with clinical observations, patient history, and epidemiological information. The expected result is Negative.  Fact Sheet for Patients:  EntrepreneurPulse.com.au  Fact Sheet for Healthcare Providers:  IncredibleEmployment.be  This test is no t yet approved or cleared by the [place] FDA and  has been authorized for detection and/or diagnosis of SARS-CoV-2 by FDA under an Emergency Use Authorization (EUA). This EUA will remain  in effect (meaning this test can be used) for the duration of the COVID-19 declaration under Section 564(b)(1) of the Act, 21 U.S.C.section 360bbb-3(b)(1), unless the authorization is terminated  or revoked sooner.       Influenza A by PCR NEGATIVE NEGATIVE Final   Influenza  B by PCR NEGATIVE NEGATIVE Final    Comment: (NOTE) The Xpert Xpress SARS-CoV-2/FLU/RSV plus assay is intended as an aid in the diagnosis of influenza from Nasopharyngeal swab specimens and should not be used as a sole basis for treatment. Nasal washings and aspirates are unacceptable for Xpert Xpress SARS-CoV-2/FLU/RSV testing.  Fact Sheet for Patients: BloggerCourse.com  Fact Sheet for Healthcare Providers: SeriousBroker.it  This test is not yet approved or cleared by the [place] FDA and has been authorized for detection and/or diagnosis of SARS-CoV-2 by FDA under an Emergency Use Authorization (EUA). This EUA will remain in effect (meaning this test can be used) for the duration of the COVID-19 declaration under Section 564(b)(1) of the Act, 21 U.S.C. section 360bbb-3(b)(1), unless the authorization is terminated or revoked.  Performed at [facility], [address] [place], [place] [address]   CULTURE, BLOOD (ROUTINE X 2) w Reflex to ID Panel      Status: None (Preliminary result)   Collection Time: 02/28/20  1:43 PM   Specimen: BLOOD  Result Value Ref Range Status   Specimen Description BLOOD RIGHT ANTECUBITAL  Final   Special Requests   Final    BOTTLES DRAWN AEROBIC AND ANAEROBIC Blood Culture adequate volume   Culture   Final    NO GROWTH 2 DAYS Performed at [facility], [address] [place], [place] [address]    Report Status PENDING  Incomplete  CULTURE, BLOOD (ROUTINE X 2) w Reflex to ID Panel     Status: None (Preliminary result)   Collection Time: 02/28/20  1:44 PM   Specimen: BLOOD  Result Value Ref Range Status   Specimen Description BLOOD LEFT ANTECUBITAL  Final   Special Requests   Final    BOTTLES DRAWN AEROBIC AND ANAEROBIC Blood Culture adequate volume   Culture   Final    NO GROWTH 2 DAYS Performed at [facility], [address] [place], [place] [address]    Report Status PENDING  Incomplete         Radiology Studies: CT ABDOMEN PELVIS WO CONTRAST  Result Date: 02/28/2020 CLINICAL DATA:  Leukocytosis. EXAM: CT ABDOMEN AND PELVIS WITHOUT CONTRAST TECHNIQUE: Multidetector CT imaging of the abdomen and pelvis was performed following the standard protocol without IV contrast. COMPARISON:  January 03, 2020. FINDINGS: Lower chest: Moderate bilateral pleural effusions are noted, right greater than left, with associated right basilar atelectasis or infiltrate. Hepatobiliary: Cholelithiasis is noted. No biliary dilatation is noted. Several hepatic cysts are noted and stable. Pancreas: Unremarkable. No pancreatic ductal dilatation or surrounding inflammatory changes. Spleen: Calcified splenic granulomata are again noted. There appears to be interval development of fluid density measuring 5.7 x 4.4 cm seen involving the superior aspect of the spleen. This is concerning for possible abscess or perisplenic hematoma. Adrenals/Urinary Tract: Adrenal glands are unremarkable. Kidneys are normal,  without renal calculi, focal lesion, or hydronephrosis. Bladder is unremarkable. Stomach/Bowel: Stomach appears normal. There is no evidence of bowel obstruction or inflammation. The appendix is not visualized, but no inflammation is noted in the right lower quadrant. Vascular/Lymphatic: Aortic atherosclerosis. No enlarged abdominal or pelvic lymph nodes. Reproductive: Intrauterine device is noted. No adnexal abnormality is noted. Other: No abdominal wall hernia or abnormality. No abdominopelvic ascites. Musculoskeletal: No acute or significant osseous findings. IMPRESSION: 1. Moderate bilateral pleural effusions are noted, right greater than left, with associated right basilar atelectasis or infiltrate. 2. Interval development of 5.7 x 4.4 cm fluid density seen involving the superior  aspect of the spleen concerning for possible abscess or perisplenic hematoma. CT scan of the abdomen with intravenous contrast is recommended for further evaluation. 3. Cholelithiasis. 4. Aortic atherosclerosis. Aortic Atherosclerosis (ICD10-I70.0). Electronically Signed   By: Marijo Conception M.D.   On: 02/28/2020 09:15   MR ABDOMEN MRCP WO CONTRAST  Result Date: 02/29/2020 CLINICAL DATA:  Gallstones with sonographic Murphy's sign. New splenic hypodense lesion on CT. EXAM: MRI ABDOMEN WITHOUT CONTRAST  (INCLUDING MRCP) TECHNIQUE: Multiplanar multisequence MR imaging of the abdomen was performed. Heavily T2-weighted images of the biliary and pancreatic ducts were obtained, and three-dimensional MRCP images were rendered by post processing. COMPARISON:  Multiple exams, including 02/28/2020 CT and 02/26/2020 ultrasound FINDINGS: Lower chest: Substantial bilateral pleural effusions with passive atelectasis. Mild cardiomegaly. Hepatobiliary: Multiple sharply defined T2 hyperintense hepatic lesions favoring cysts. Some such the 3.3 by 2.2 cm cystic lesion inferiorly in the right hepatic lobe on image 27 of series 3 may have associated  faint internal septation. No obvious internal nodularity. Multiple gallstones measuring up to about 0.8 cm in diameter are subtle dependently in the gallbladder. Common bile duct up to 0.6 cm in diameter (upper normal for age) with slightly abrupt distal truncation rather than conical tapering at the tip, but without a well-defined low signal intensity within the CBD to further favor choledocholithiasis. No intrahepatic biliary dilatation. Pancreas:  Unremarkable Spleen: 4.1 by 2.3 cm peripheral primarily fluid lesion with high T2 and intermediate to high T1 signal intensity peripherally in the spleen, with a low signal intensity somewhat irregular rim on diffusion-weighted images. Irregular similar signal in the anterior inferior spleen in a bandlike configuration. Medially in the spleen there is a 2.3 by 2.1 cm region of reduced T1 and reduced T2 signal representing another splenic lesion, with a similar appearance inferiorly. Adrenals/Urinary Tract:  Unremarkable Stomach/Bowel: Unremarkable Vascular/Lymphatic:  Aortoiliac atherosclerotic vascular disease. Other: Bilateral flank edema in the subcutaneous tissues. Low-grade retroperitoneal edema. Musculoskeletal: Levoconvex lumbar scoliosis with spondylosis and degenerative disc disease. Nonspecific 8 mm T2 hyperintense lesion eccentric to the left in the L4 vertebral body. IMPRESSION: 1. Cholelithiasis. No biliary dilatation or definite choledocholithiasis. There is slightly abrupt distal truncation of the common bile duct rather than conical tapering at the tip of the CBD, but without a well-defined low signal intensity CBD to further indicate choledocholithiasis. 2. Multiple hepatic cysts, some with mild septation. Strictly speaking, cystadenoma is not excluded. 3. Complex splenic lesions with high T2 and intermediate to high T1 signal intensity, and are new from 01/03/2020. Top differential diagnostic considerations include splenic hematomas (potentially in the  setting of prior splenic infarcts) versus less likely splenic abscesses. No gas is identified in these lesions. Today's exam was not performed with IV contrast, which can reduced specificity. 4. Substantial bilateral pleural effusions with passive atelectasis. 5. Mild cardiomegaly. 6. Nonspecific 8 mm T2 hyperintense lesion eccentric to the left in the L4 vertebral body. This could be an atypical hemangioma but is nonspecific. If the patient has a history of malignancy with predilection for bony spread such as breast or lung cancer, nuclear medicine bone scan may be helpful for further characterization. Electronically Signed   By: Van Clines M.D.   On: 02/29/2020 08:14   MR 3D Recon At Scanner  Result Date: 02/29/2020 CLINICAL DATA:  Gallstones with sonographic Murphy's sign. New splenic hypodense lesion on CT. EXAM: MRI ABDOMEN WITHOUT CONTRAST  (INCLUDING MRCP) TECHNIQUE: Multiplanar multisequence MR imaging of the abdomen was performed. Heavily T2-weighted images of the  biliary and pancreatic ducts were obtained, and three-dimensional MRCP images were rendered by post processing. COMPARISON:  Multiple exams, including 02/28/2020 CT and 02/26/2020 ultrasound FINDINGS: Lower chest: Substantial bilateral pleural effusions with passive atelectasis. Mild cardiomegaly. Hepatobiliary: Multiple sharply defined T2 hyperintense hepatic lesions favoring cysts. Some such the 3.3 by 2.2 cm cystic lesion inferiorly in the right hepatic lobe on image 27 of series 3 may have associated faint internal septation. No obvious internal nodularity. Multiple gallstones measuring up to about 0.8 cm in diameter are subtle dependently in the gallbladder. Common bile duct up to 0.6 cm in diameter (upper normal for age) with slightly abrupt distal truncation rather than conical tapering at the tip, but without a well-defined low signal intensity within the CBD to further favor choledocholithiasis. No intrahepatic biliary  dilatation. Pancreas:  Unremarkable Spleen: 4.1 by 2.3 cm peripheral primarily fluid lesion with high T2 and intermediate to high T1 signal intensity peripherally in the spleen, with a low signal intensity somewhat irregular rim on diffusion-weighted images. Irregular similar signal in the anterior inferior spleen in a bandlike configuration. Medially in the spleen there is a 2.3 by 2.1 cm region of reduced T1 and reduced T2 signal representing another splenic lesion, with a similar appearance inferiorly. Adrenals/Urinary Tract:  Unremarkable Stomach/Bowel: Unremarkable Vascular/Lymphatic:  Aortoiliac atherosclerotic vascular disease. Other: Bilateral flank edema in the subcutaneous tissues. Low-grade retroperitoneal edema. Musculoskeletal: Levoconvex lumbar scoliosis with spondylosis and degenerative disc disease. Nonspecific 8 mm T2 hyperintense lesion eccentric to the left in the L4 vertebral body. IMPRESSION: 1. Cholelithiasis. No biliary dilatation or definite choledocholithiasis. There is slightly abrupt distal truncation of the common bile duct rather than conical tapering at the tip of the CBD, but without a well-defined low signal intensity CBD to further indicate choledocholithiasis. 2. Multiple hepatic cysts, some with mild septation. Strictly speaking, cystadenoma is not excluded. 3. Complex splenic lesions with high T2 and intermediate to high T1 signal intensity, and are new from 01/03/2020. Top differential diagnostic considerations include splenic hematomas (potentially in the setting of prior splenic infarcts) versus less likely splenic abscesses. No gas is identified in these lesions. Today's exam was not performed with IV contrast, which can reduced specificity. 4. Substantial bilateral pleural effusions with passive atelectasis. 5. Mild cardiomegaly. 6. Nonspecific 8 mm T2 hyperintense lesion eccentric to the left in the L4 vertebral body. This could be an atypical hemangioma but is nonspecific.  If the patient has a history of malignancy with predilection for bony spread such as breast or lung cancer, nuclear medicine bone scan may be helpful for further characterization. Electronically Signed   By: Gaylyn Rong M.D.   On: 02/29/2020 08:14   US LIVER DOPPLER  Result Date: 02/29/2020 CLINICAL DATA:  Evaluate hepatic vasculature. Evaluate for cholecystitis. EXAM: 1. ULTRASOUND ABDOMEN LIMITED RIGHT UPPER QUADRANT 2. DUPLEX ULTRASOUND OF LIVER TECHNIQUE: Standard right upper quadrant abdominal ultrasound was performed followed by color and duplex Doppler ultrasound to evaluate the hepatic in-flow and out-flow vessels. COMPARISON:  Right upper quadrant abdominal ultrasound-01/03/2020; 02/26/2020; CT abdomen pelvis-02/28/2020; MRCP-02/28/2020 FINDINGS: Gallbladder: Redemonstrated multiple echogenic layering gallstones within otherwise normal-appearing gallbladder. Index gallstone measures approximately 1.1 cm in greatest short axis diameter. No gallbladder wall thickening or pericholecystic stranding. Evaluation for sonographic Eulah Pont sign is degraded secondary to patient's mental status. Common bile duct: Diameter: Normal in size measuring 2.4 mm in diameter Liver: Mild nodularity of the hepatic contour. Note is made of 2 punctate (approximately 1.0 cm) anechoic cysts within the left lobe of  the liver. Note is made of two partially septated, minimally complex cyst within the right lobe of the liver with dominant cyst measuring approximately 3.4 x 3.1 x 2.8 cm, better demonstrated on preceding MRCP. Other: Note is made of a moderate-sized right-sided pleural effusion. _________________________________________________________ Main Portal Vein size: 1.0 cm cm Portal Vein Velocities (normal hepatopetal directional flow) Main Prox:  33 cm/sec Main Mid: 18 cm/sec Main Dist:  19 cm/sec Right: 19 cm/sec Left: 19 cm/sec Hepatic Vein Velocities (normal hepatofugal directional flow) Right:  28 cm/sec Middle:   28 cm/sec Left:  49 cm/sec IVC: Present and patent with normal respiratory phasicity. Hepatic Artery Velocity: 99 cm/sec Splenic Vein Velocity: 40 cm/sec Spleen: 6.9 cm x 3.9 cm x 5.0 cm with a total volume of 67 cm^3 (411 cm^3 is upper limit normal) Portal Vein Occlusion/Thrombus: No Splenic Vein Occlusion/Thrombus: No Ascites: None Varices: None IMPRESSION: 1. Patent hepatic vasculature with normal directional flow. 2. Cholelithiasis without sonographic evidence of acute cholecystitis though evaluation for sonographic Murphy sign degraded secondary to altered mental status. If there remains clinical concern for acute cholecystitis, further evaluation nuclear medicine HIDA scan could be performed as indicated. 3. Scattered hepatic cysts, several of which appear minimally complex/partially septated, better evaluated on preceding MRCP. 4. Moderate sized right-sided pleural effusion as demonstrated on preceding abdominal CT. Electronically Signed   By: Sandi Mariscal M.D.   On: 02/29/2020 10:02   US Abdomen Limited RUQ (LIVER/GB)  Result Date: 02/29/2020 CLINICAL DATA:  Evaluate hepatic vasculature. Evaluate for cholecystitis. EXAM: 1. ULTRASOUND ABDOMEN LIMITED RIGHT UPPER QUADRANT 2. DUPLEX ULTRASOUND OF LIVER TECHNIQUE: Standard right upper quadrant abdominal ultrasound was performed followed by color and duplex Doppler ultrasound to evaluate the hepatic in-flow and out-flow vessels. COMPARISON:  Right upper quadrant abdominal ultrasound-01/03/2020; 02/26/2020; CT abdomen pelvis-02/28/2020; MRCP-02/28/2020 FINDINGS: Gallbladder: Redemonstrated multiple echogenic layering gallstones within otherwise normal-appearing gallbladder. Index gallstone measures approximately 1.1 cm in greatest short axis diameter. No gallbladder wall thickening or pericholecystic stranding. Evaluation for sonographic Percell Miller sign is degraded secondary to patient's mental status. Common bile duct: Diameter: Normal in size measuring 2.4 mm  in diameter Liver: Mild nodularity of the hepatic contour. Note is made of 2 punctate (approximately 1.0 cm) anechoic cysts within the left lobe of the liver. Note is made of two partially septated, minimally complex cyst within the right lobe of the liver with dominant cyst measuring approximately 3.4 x 3.1 x 2.8 cm, better demonstrated on preceding MRCP. Other: Note is made of a moderate-sized right-sided pleural effusion. _________________________________________________________ Main Portal Vein size: 1.0 cm cm Portal Vein Velocities (normal hepatopetal directional flow) Main Prox:  33 cm/sec Main Mid: 18 cm/sec Main Dist:  19 cm/sec Right: 19 cm/sec Left: 19 cm/sec Hepatic Vein Velocities (normal hepatofugal directional flow) Right:  28 cm/sec Middle:  28 cm/sec Left:  49 cm/sec IVC: Present and patent with normal respiratory phasicity. Hepatic Artery Velocity: 99 cm/sec Splenic Vein Velocity: 40 cm/sec Spleen: 6.9 cm x 3.9 cm x 5.0 cm with a total volume of 67 cm^3 (411 cm^3 is upper limit normal) Portal Vein Occlusion/Thrombus: No Splenic Vein Occlusion/Thrombus: No Ascites: None Varices: None IMPRESSION: 1. Patent hepatic vasculature with normal directional flow. 2. Cholelithiasis without sonographic evidence of acute cholecystitis though evaluation for sonographic Murphy sign degraded secondary to altered mental status. If there remains clinical concern for acute cholecystitis, further evaluation nuclear medicine HIDA scan could be performed as indicated. 3. Scattered hepatic cysts, several of which appear minimally complex/partially septated, better evaluated  on preceding MRCP. 4. Moderate sized right-sided pleural effusion as demonstrated on preceding abdominal CT. Electronically Signed   By: Sandi Mariscal M.D.   On: 02/29/2020 10:02        Scheduled Meds: . feeding supplement  237 mL Oral BID BM  . fluticasone furoate-vilanterol  1 puff Inhalation Daily  . metoprolol tartrate  75 mg Oral BID  .  sodium chloride flush  3 mL Intravenous Q12H   Continuous Infusions: . sodium chloride 250 mL (02/29/20 2247)  . piperacillin-tazobactam (ZOSYN)  IV 3.375 g (03/01/20 SE:[id number])     LOS: 4 days    Time spent: 33 mins     Wyvonnia Dusky, MD [facility] Pager [phone]  If 7PM-7AM, please contact night-coverage 03/01/2020, 8:39 AM

## 2020-03-01 NOTE — Evaluation (Signed)
Physical Therapy Evaluation Patient Details Name: Paula Klein MRN: [id number] DOB: 1946-02-28 Today's Date: 03/01/2020   History of Present Illness  Paula Klein is a 74 year old female with history of DVT on chronic anticoagulation, essential hypertension who came to the hospital with multiple complaints including short of breath, palpitation, abdominal pain and dysphagia.  Admitted for management of acute/chronic CHF.  Clinical Impression  Upon evaluation, patient alert and oriented; follows commands and agreeable to participation with session.  Requests toilet transfer during session "to try to have a BM".  Bilat UE/LE strength and ROM grossly symmetrical and WFL; no focal weakness appreciated.  Able to complete sit/stand, basic transfers and gait (25' x2) with RW, cga/close sup.  Demonstrates forward flexed posture, mod WBing on RW; slow and deliberate with gait performance, but no overt buckling or LOB. Subjectively endorses preference for continued use of assist device.  Sats 92-93% with exertion on 3L; HR 110-120s (quickly returns to 90s with seated position). Would benefit from skilled PT to address above deficits and promote optimal return to PLOF.; Recommend transition to HHPT upon discharge from acute hospitalization.      Follow Up Recommendations Home health PT    Equipment Recommendations       Recommendations for Other Services       Precautions / Restrictions Precautions Precautions: Fall Restrictions Weight Bearing Restrictions: No      Mobility  Bed Mobility               General bed mobility comments: seated in recliner beginning/end of treatment session    Transfers Overall transfer level: Needs assistance Equipment used: Rolling walker (2 wheeled) Transfers: Sit to/from Stand Sit to Stand: Min guard;Supervision         General transfer comment: cuing for hand placement to prevent pulling on RW  Ambulation/Gait Ambulation/Gait assistance:  Supervision;Min guard Gait Distance (Feet):  (25' x2) Assistive device: Rolling walker (2 wheeled)       General Gait Details: forward flexed posture, mod WBing on RW; slow and deliberate with gait performance, but no overt buckling or LOB. Subjectively endorses preference for continued use of assist device.  Stairs            Wheelchair Mobility    Modified Rankin (Stroke Patients Only)       Balance Overall balance assessment: Needs assistance Sitting-balance support: No upper extremity supported;Feet supported Sitting balance-Leahy Scale: Good     Standing balance support: Bilateral upper extremity supported Standing balance-Leahy Scale: Fair                               Pertinent Vitals/Pain Pain Assessment: No/denies pain    Home Living Family/patient expects to be discharged to:: Private residence Living Arrangements: Spouse/significant other Available Help at Discharge: Family;Available 24 hours/day Type of Home: House Home Access: Ramped entrance     Home Layout: One level Home Equipment: Walker - 2 wheels;Shower seat;Transport chair Additional Comments: reports fiancee and brother can provide near 24/7 if needed    Prior Function Level of Independence: Independent with assistive device(s)         Comments: RW for household mobility, recently using transport chair when feeling weak (last ~month)     Hand Dominance        Extremity/Trunk Assessment   Upper Extremity Assessment Upper Extremity Assessment: Overall WFL for tasks assessed    Lower Extremity Assessment Lower Extremity Assessment: Overall WFL for tasks  assessed (grossly at least 4-/5 throughout; supports body weight without buckling, LOB)       Communication   Communication: No difficulties  Cognition Arousal/Alertness: Awake/alert Behavior During Therapy: WFL for tasks assessed/performed Overall Cognitive Status: Within Functional Limits for tasks assessed                                         General Comments      Exercises Other Exercises Other Exercises: Toilet transfer, ambulatory with RW, cga/close sup; sit/stand from standard commode with RW/grab bar, cga/close sup. Increased time to complete, but performs with limited physical assist.  Standing balance for hand hygiene at sink, close sup; does require/prefer at least unilateral UE stabilization for support throughout.   Assessment/Plan    PT Assessment Patient needs continued PT services  PT Problem List Decreased strength;Decreased activity tolerance;Decreased balance;Decreased mobility;Cardiopulmonary status limiting activity       PT Treatment Interventions DME instruction;Gait training;Functional mobility training;Therapeutic activities;Stair training;Therapeutic exercise;Balance training;Patient/family education    PT Goals (Current goals can be found in the Care Plan section)  Acute Rehab PT Goals Patient Stated Goal: To return home PT Goal Formulation: With patient Time For Goal Achievement: 03/15/20 Potential to Achieve Goals: Good    Frequency Min 2X/week   Barriers to discharge        Co-evaluation               AM-PAC PT "6 Clicks" Mobility  Outcome Measure Help needed turning from your back to your side while in a flat bed without using bedrails?: None Help needed moving from lying on your back to sitting on the side of a flat bed without using bedrails?: None Help needed moving to and from a bed to a chair (including a wheelchair)?: None Help needed standing up from a chair using your arms (e.g., wheelchair or bedside chair)?: None Help needed to walk in hospital room?: A Little Help needed climbing 3-5 steps with a railing? : A Little 6 Click Score: 22    End of Session Equipment Utilized During Treatment: Gait belt;Oxygen Activity Tolerance: Patient tolerated treatment well Patient left: in chair;with call bell/phone within  reach;with family/visitor present Nurse Communication: Mobility status PT Visit Diagnosis: Muscle weakness (generalized) (M62.81);Difficulty in walking, not elsewhere classified (R26.2)    Time: [id number] PT Time Calculation (min) (ACUTE ONLY): 20 min   Charges:   PT Evaluation $PT Eval Moderate Complexity: 1 Mod PT Treatments $Therapeutic Activity: 8-22 mins        Teddy Rebstock H. Owens Shark, PT, DPT, NCS 03/01/20, 9:35 AM [phone]

## 2020-03-01 NOTE — Consult Note (Signed)
ANTICOAGULATION CONSULT NOTE - Initial Consult  Pharmacy Consult for heparin Indication: atrial fibrillation  Allergies  Allergen Reactions  . Pine Tar Cough  . Sulfa Antibiotics Rash  . Tetracyclines & Related Rash    Patient Measurements: Height: 5\' 1"  (154.9 cm) Weight: 70.8 kg (156 lb) IBW/kg (Calculated) : 47.8 Heparin Dosing Weight: 63.3  Vital Signs: Temp: 98.3 F (36.8 C) (12/29 1947) Temp Source: Oral (12/29 1947) BP: 124/88 (12/29 1947) Pulse Rate: 103 (12/29 1947)  Labs: Recent Labs    02/28/20 0357 02/29/20 0617 03/01/20 0432 03/01/20 1031 03/01/20 2101  HGB 11.1* 10.7* 11.4*  --   --   HCT 34.1* 34.9* 36.7  --   --   PLT 610* 611* 661*  --   --   APTT  --   --   --  27 30  LABPROT  --  22.7* 17.4*  --   --   INR  --  2.1* 1.5*  --   --   HEPARINUNFRC  --   --   --  0.59  --   CREATININE 1.63* 1.24* 0.79  --   --     Estimated Creatinine Clearance: 55.5 mL/min (by C-G formula based on SCr of 0.79 mg/dL).   Medical History: Past Medical History:  Diagnosis Date  . H/O blood clots   . Heart murmur   . Hypertension     Medications:  Pt on Xarelto prior to admission. Pt received last dose on 12/25 while inpatient.   Assessment: 74 year old female presenting with shortness of breath, palpitations, abdominal pain, and dysphagia. Pt found to have volume overload including orthopnea and paroxysmal nocturnal dyspnea. 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 Pt in atrial fibrillation since admission. Pharmacy has been consulted to dose heparin for atrial fibrillation.   Baseline aPTT 27, HL 0.59, Hgb 11.4, Plt 661, PT 17.4, INR 1.5  Baseline heparin is falsely elevated due to Xarelto PTA. Will have to monitor heparin infusion with aPTT level until DOAC is washed out.  Goal of Therapy:  aPTT 66-102 Heparin level 0.3-0.7 units/ml Monitor platelets by anticoagulation protocol: Yes   Plan:  12/29:  APTT @ 2101 = 30 Will order Heparin 1900 units IV X  1 and increase drip rate to 1150 units/hr.  Will recheck HL and aPTT 8 hrs after rate change.   Welden Hausmann D, PharmD 03/01/2020 9:57 PM

## 2020-03-01 NOTE — Progress Notes (Signed)
[facility] SURGICAL PROGRESS NOTE (cpt [phone])  Hospital Day(s): 4.   Interval History: Patient seen and examined, no acute events or new complaints overnight. Patient reports she is feeling better this morning, denies any fever, chills, nausea, emesis, abdominal pain. Her leukocytosis continues to make gradual improvements, down to 15.6K. she continues to have persistent thrombocytosis with PLT count of 661K. sCr has normalized at 0.79. Electrolyte derangements are improving. Continued improvement in AST/ALt with persistent, but stable, mixed hyperbilirubinemia. I reviewed images and case with Dr Grace Isaac (IR) yesterday and decision was made to forgo percutaneous drainage for now as she was clinically improved from an abdominal pain standpoint and she had no ideal and safe percutaneous window. She has continued on Zosyn. She is tolerating diet without issue.   Review of Systems:  Constitutional: denies fever, chills  HEENT: denies cough or congestion  Respiratory: denies any shortness of breath  Cardiovascular: denies chest pain or palpitations  Gastrointestinal: denies abdominal pain, N/V, or diarrhea/and bowel function as per interval history Genitourinary: denies burning with urination or urinary frequency   Vital signs in last 24 hours: [min-max] current  Temp:  [97.5 F (36.4 C)-97.9 F (36.6 C)] 97.7 F (36.5 C) (12/29 0416) Pulse Rate:  [101-113] 108 (12/29 0416) Resp:  [19-21] 19 (12/28 1654) BP: (109-140)/(81-105) 128/94 (12/29 0416) SpO2:  [96 %-99 %] 96 % (12/29 0416) Weight:  [70.8 kg] 70.8 kg (12/29 0532)     Height: 5\' 1"  (154.9 cm) Weight: 70.8 kg BMI (Calculated): 29.49   Intake/Output last 2 shifts:  12/28 0701 - 12/29 0700 In: 864.9 [P.O.:600; IV Piggyback:264.9] Out: 2400 [Urine:2400]   Physical Exam:  Constitutional: alert, cooperative and no distress  HENT: normocephalic without obvious abnormality  Eyes: PERRL, EOM's grossly intact and symmetric   Respiratory: on [place], short shallow breathing Cardiovascular: tachycardic, irregularly irregular  Gastrointestinal: Soft, non-tender, non-distended, no rebound/guarding Musculoskeletal: UE and LE FROM, + edema   Labs:  CBC Latest Ref Rng & Units 03/01/2020 02/29/2020 02/28/2020  WBC 4.0 - 10.5 K/uL 15.6(H) 16.1(H) 17.9(H)  Hemoglobin 12.0 - 15.0 g/dL 11.4(L) 10.7(L) 11.1(L)  Hematocrit 36.0 - 46.0 % 36.7 34.9(L) 34.1(L)  Platelets 150 - 400 K/uL 661(H) 611(H) 610(H)   CMP Latest Ref Rng & Units 03/01/2020 02/29/2020 02/28/2020  Glucose 70 - 99 mg/dL 03/01/2020) 983(J) 825(K)  BUN 8 - 23 mg/dL 539(J) 67(H) 41(P)  Creatinine 0.44 - 1.00 mg/dL 37(T 0.24) 0.97(D)  Sodium 135 - 145 mmol/L 136 130(L) 126(L)  Potassium 3.5 - 5.1 mmol/L 3.5 4.2 4.6  Chloride 98 - 111 mmol/L 96(L) 92(L) 90(L)  CO2 22 - 32 mmol/L 30 27 24   Calcium 8.9 - 10.3 mg/dL 9.1 9.2 9.1  Total Protein 6.5 - 8.1 g/dL 6.0(L) 6.2(L) 6.1(L)  Total Bilirubin 0.3 - 1.2 mg/dL 2.0(H) 2.0(H) 1.7(H)  Alkaline Phos 38 - 126 U/L 94 93 101  AST 15 - 41 U/L 279(H) 480(H) 1,059(H)  ALT 0 - 44 U/L 412(H) 534(H) 737(H)     Imaging studies: No new pertinent imaging studies   Assessment/Plan: (ICD-10's: K81.0) 74 y.o. female with gradual improvement in leukocytosis, LFT elevation, and hyperbilirubinemia found to have cholelithiasis without definitive evidence of cholecystitis, complicated by pertinent comorbidities including atrial fibrillation on anticoagulation, CHF,history of COVID   - Again reviewed case and imaging yesterday with Dr (IR). Given her clinical improvements, lack of definitive radiographic signs of cholecystitis, and no safe percutaneous window, the decision was made to forgo percutaneous cholecystostomy at this  time. We will continue to follow clinically. HIDA can be considered moving forward but given her LFT elevations, this may not be of much benefit currently.   - Continue IV Abx (Zosyn) - Monitor  abdominal examination - Pain control prn; antiemetics prn   - She is unfortunately NOT a good surgical candidate given her comorbid conditions  - Appreciate GI input; work up pending     - Appreciate cardiology assistance; no planned procedure so okay for anticoagulation  - Further management per primary service; we will follow    All of the above findings and recommendations were discussed with the patient, patient's family (at bedside), and the medical team, and all of patient's and family's questions were answered to their expressed satisfaction.   -- Edison Simon, PA-C [facility] 03/01/2020, 7:08 AM [phone] M-F: 7am - 4pm

## 2020-03-01 NOTE — Progress Notes (Addendum)
Progress Note  Patient Name: Paula Klein Date of Encounter: 03/01/2020  Primary Cardiologist: Agbor-Etang  Subjective   Up sitting in her recliner. No chest pain, SOB, palpitations, dizziness, presyncope, or syncope. No abdominal pain. She remains in Afib with improving ventricular rates in the 90s to low 100s bpm. No plans for surgical or IR intervention at this time. Documented UOP 1.5 L for the past 24 hours with a net - 2.3 L for the admission. Weight 75.8-->70.8 kg. Renal function continues to improve.   Inpatient Medications    Scheduled Meds: . feeding supplement  237 mL Oral BID BM  . fluticasone furoate-vilanterol  1 puff Inhalation Daily  . metoprolol tartrate  75 mg Oral BID  . sodium chloride flush  3 mL Intravenous Q12H   Continuous Infusions: . sodium chloride 250 mL (02/29/20 2247)  . piperacillin-tazobactam (ZOSYN)  IV 3.375 g (03/01/20 0607)   PRN Meds: sodium chloride, acetaminophen, albuterol, HYDROmorphone (DILAUDID) injection, ondansetron (ZOFRAN) IV, sodium chloride flush   Vital Signs    Vitals:   02/29/20 2238 03/01/20 0416 03/01/20 0532 03/01/20 0720  BP:  (!) 128/94  (!) 145/97  Pulse: (!) 104 (!) 108  (!) 107  Resp:    18  Temp:  97.7 F (36.5 C)  (!) 97.5 F (36.4 C)  TempSrc:  Oral  Oral  SpO2: 97% 96%  96%  Weight:   70.8 kg   Height:        Intake/Output Summary (Last 24 hours) at 03/01/2020 0839 Last data filed at 03/01/2020 0532 Gross per 24 hour  Intake 864.91 ml  Output 2400 ml  Net -1535.09 ml   Filed Weights   02/28/20 0417 02/29/20 0355 03/01/20 0532  Weight: 75.3 kg 75.8 kg 70.8 kg    Telemetry    Afib, 90s to low 100s bpm, rare isolated PVCs - Personally Reviewed  ECG    No new tracings - Personally Reviewed  Physical Exam   GEN: No acute distress.   Neck: No JVD. Cardiac: IRIR, II/VI systolic murmur, no rubs, or gallops.  Respiratory:  Faintly diminished along the bilateral bases.  GI: Soft,  nontender, non-distended.   MS: No edema; No deformity. Neuro:  Alert and oriented x3; Nonfocal.  Psych: Normal affect, responds to questions appropriately.  Labs    Chemistry Recent Labs  Lab 02/28/20 0357 02/29/20 0617 03/01/20 0432  NA 126* 130* 136  K 4.6 4.2 3.5  CL 90* 92* 96*  CO2 24 27 30   GLUCOSE 107* 107* 104*  BUN 42* 38* 27*  CREATININE 1.63* 1.24* 0.79  CALCIUM 9.1 9.2 9.1  PROT 6.1* 6.2* 6.0*  ALBUMIN 3.1* 3.0* 2.9*  AST 1,059* 480* 279*  ALT 737* 534* 412*  ALKPHOS 101 93 94  BILITOT 1.7* 2.0* 2.0*  GFRNONAA 33* 46* >60  ANIONGAP 12 11 10      Hematology Recent Labs  Lab 02/28/20 0357 02/29/20 0617 03/01/20 0432  WBC 17.9* 16.1* 15.6*  RBC 4.45 4.45 4.68  HGB 11.1* 10.7* 11.4*  HCT 34.1* 34.9* 36.7  MCV 76.6* 78.4* 78.4*  MCH 24.9* 24.0* 24.4*  MCHC 32.6 30.7 31.1  RDW 16.4* 16.2* 16.7*  PLT 610* 611* 661*    Cardiac EnzymesNo results for input(s): TROPONINI in the last 168 hours. No results for input(s): TROPIPOC in the last 168 hours.   BNP Recent Labs  Lab 02/26/20 1234  BNP 1,132.2*     DDimer No results for input(s): DDIMER in the  last 168 hours.   Radiology    DG Chest Portable 1 View  Result Date: 02/26/2020 IMPRESSION: Small bilateral pleural effusions, left greater than right. Associated bibasilar opacities, may represent atelectasis and/or pneumonia. Electronically Signed   By: Davina Poke D.O.   On: 02/26/2020 12:59   US Abdomen Limited RUQ (LIVER/GB)  Result Date: 02/26/2020 IMPRESSION: 1. Multiple gallstones and positive sonographic Murphy's sign. Evaluate for acute cholecystitis. 2. Normal common bile duct. 3. RIGHT effusion and trace ascites. Electronically Signed   By: Suzy Bouchard M.D.   On: 02/26/2020 13:19    Cardiac Studies   2D echo 02/22/2020: 1. Left ventricular ejection fraction, by estimation, is 20 to 25%. The  left ventricle has severely decreased function. The left ventricle has no   regional wall motion abnormalities. Left ventricular diastolic parameters  are indeterminate.  2. Right ventricular systolic function is mildly reduced. The right  ventricular size is mildly enlarged. There is mildly elevated pulmonary  artery systolic pressure. The estimated right ventricular systolic  pressure is [id number].  3. Left atrial size was moderately dilated.  4. Right atrial size was moderately dilated.  5. A small pericardial effusion is present. The pericardial effusion is  posterior to the left ventricle. There is no evidence of cardiac  tamponade. Moderate pleural effusion in the left lateral region.  6. The mitral valve is normal in structure. Moderate mitral valve  regurgitation. No evidence of mitral stenosis.  7. The aortic valve is normal in structure. Aortic valve regurgitation is  not visualized. No aortic stenosis is present.  8. The inferior vena cava is dilated in size with <50% respiratory  variability, suggesting right atrial pressure of 15 mmHg.  Patient Profile     74 y.o. female with history of HFrEF, Afib, moderate mitral regurgitation, recent Covid infection in 12/2019, DVT, and COPD with prior tobacco use admitted with acute cholecystitis who we are seeing for HFrEF and Afib.   Assessment & Plan    1. HFrEF: -Volume status is improved -Continue to avoid diltiazem given her cardiomyopathy -For now, she remains on Lopressor for added ventricular rate control in the setting of her Afib and acute illness. As these improve, as an outpatient, consider consolidating to Toprol or changing to Coreg given her cardiomyopathy, possibly prior to discharge  -Currently, we need available BP room for added ventricular rate control, as this improves, look to escalate GDMT further with addition of ACEi/ARB/ARNI/MRA as able, AKI has also precluded the addition of these medications during this admission -IV Lasix 20 mg bid -Plan for [facility] 12/31 prior to discharge  given improved renal function to further evaluate her cardiomyopathy  -Strict I's and O's -Daily weights  2. Persistent Afib with RVR: -She remains in Afib with RVR with improving ventricular rates in the 90s to low 100s bpm -Titrate metoprolol tartrate to 100 mg twice daily -Diltiazem is not a great option given her cardiomyopathy -Start heparin gtt as above with plans to resume PTA Xarelto 20 mg daily prior to discharge given there are no plans for surgical or IR procedures as outlined below. She prefers Xarelto over Eliquis as she was on this at home and could not afford Eliquis -CHA2DS2-VASc 4  -Once she has improved from her acute illness, if she remains in A. fib, once she has been adequately anticoagulated without interruption for 3 to 4 weeks consider outpatient DCCV  3. Preoperative cardiac risk stratification: -Not felt to be a good surgical candidate for cholecystectomy  given her multiple comorbid conditions, from a cardiac perspective, this can be reassessed at a later date once her condition improves -Acceptable risk for percutaneous cholecystostomy as indicated per surgical team, though this has been deferred as outlined below  4. AKI: -Improved -Estimated Creatinine Clearance: 55.5 mL/min (by C-G formula based on SCr of 0.79 mg/dL).  5. Cholelithiasis: -Improving -Not a surgical candidate at this time as previously noted -With improvement in symptoms and labs without definitive radiographic evidence of cholecystitis and no safe percutaneous window there are no current plans for surgical or IR intervention -Follow up with PCP and surgery as directed     For questions or updates, please contact [facility] Please consult [email or web address] for contact info under Cardiology/STEMI.    Signed, Eula Listen, PA-C [facility] Pager: [phone] 03/01/2020, 8:39 AM

## 2020-03-01 NOTE — Consult Note (Addendum)
ANTICOAGULATION CONSULT NOTE - Initial Consult  Pharmacy Consult for heparin Indication: atrial fibrillation  Allergies  Allergen Reactions  . Pine Tar Cough  . Sulfa Antibiotics Rash  . Tetracyclines & Related Rash    Patient Measurements: Height: 5\' 1"  (154.9 cm) Weight: 70.8 kg (156 lb) IBW/kg (Calculated) : 47.8 Heparin Dosing Weight: 63.3  Vital Signs: Temp: 97.5 F (36.4 C) (12/29 0720) Temp Source: Oral (12/29 0720) BP: 145/97 (12/29 0720) Pulse Rate: 107 (12/29 0720)  Labs: Recent Labs    02/28/20 0357 02/29/20 0617 03/01/20 0432  HGB 11.1* 10.7* 11.4*  HCT 34.1* 34.9* 36.7  PLT 610* 611* 661*  LABPROT  --  22.7* 17.4*  INR  --  2.1* 1.5*  CREATININE 1.63* 1.24* 0.79    Estimated Creatinine Clearance: 55.5 mL/min (by C-G formula based on SCr of 0.79 mg/dL).   Medical History: Past Medical History:  Diagnosis Date  . H/O blood clots   . Heart murmur   . Hypertension     Medications:  Pt on Xarelto prior to admission. Pt received last dose on 12/25 while inpatient.   Assessment: 74 year old female presenting with shortness of breath, palpitations, abdominal pain, and dysphagia. Pt found to have volume overload including orthopnea and paroxysmal nocturnal dyspnea. 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 Pt in atrial fibrillation since admission. Pharmacy has been consulted to dose heparin for atrial fibrillation.   Baseline aPTT 27, HL 0.59, Hgb 11.4, Plt 661, PT 17.4, INR 1.5  Baseline heparin is falsely elevated due to Xarelto PTA. Will have to monitor heparin infusion with aPTT level until DOAC is washed out.  Goal of Therapy:  aPTT 66-102 Heparin level 0.3-0.7 units/ml Monitor platelets by anticoagulation protocol: Yes   Plan:  Give 3200 units bolus x 1 Start heparin infusion at 900 units/hr Check aPTT level in 8 hours. Monitor heparin with aPTT until level correlates with AM HL, then monitor and adjust via HL Continue to monitor HL, H&H,  and platelets with AM labs  66, PharmD Pharmacy Resident  03/01/2020 10:06 AM

## 2020-03-02 DIAGNOSIS — I5021 Acute systolic (congestive) heart failure: Secondary | ICD-10-CM | POA: Diagnosis not present

## 2020-03-02 DIAGNOSIS — I5040 Unspecified combined systolic (congestive) and diastolic (congestive) heart failure: Secondary | ICD-10-CM

## 2020-03-02 DIAGNOSIS — N179 Acute kidney failure, unspecified: Secondary | ICD-10-CM

## 2020-03-02 DIAGNOSIS — I480 Paroxysmal atrial fibrillation: Secondary | ICD-10-CM | POA: Diagnosis not present

## 2020-03-02 DIAGNOSIS — K810 Acute cholecystitis: Secondary | ICD-10-CM

## 2020-03-02 DIAGNOSIS — K8000 Calculus of gallbladder with acute cholecystitis without obstruction: Secondary | ICD-10-CM | POA: Diagnosis not present

## 2020-03-02 LAB — CBC
HCT: 38.1 % (ref 36.0–46.0)
Hemoglobin: 11.5 g/dL — ABNORMAL LOW (ref 12.0–15.0)
MCH: 24.1 pg — ABNORMAL LOW (ref 26.0–34.0)
MCHC: 30.2 g/dL (ref 30.0–36.0)
MCV: 79.7 fL — ABNORMAL LOW (ref 80.0–100.0)
Platelets: 646 10*3/uL — ABNORMAL HIGH (ref 150–400)
RBC: 4.78 MIL/uL (ref 3.87–5.11)
RDW: 16.4 % — ABNORMAL HIGH (ref 11.5–15.5)
WBC: 13.5 10*3/uL — ABNORMAL HIGH (ref 4.0–10.5)
nRBC: 0.9 % — ABNORMAL HIGH (ref 0.0–0.2)

## 2020-03-02 LAB — MAGNESIUM: Magnesium: 1.8 mg/dL (ref 1.7–2.4)

## 2020-03-02 LAB — COMPREHENSIVE METABOLIC PANEL
ALT: 303 U/L — ABNORMAL HIGH (ref 0–44)
AST: 174 U/L — ABNORMAL HIGH (ref 15–41)
Albumin: 2.8 g/dL — ABNORMAL LOW (ref 3.5–5.0)
Alkaline Phosphatase: 91 U/L (ref 38–126)
Anion gap: 9 (ref 5–15)
BUN: 20 mg/dL (ref 8–23)
CO2: 33 mmol/L — ABNORMAL HIGH (ref 22–32)
Calcium: 8.9 mg/dL (ref 8.9–10.3)
Chloride: 92 mmol/L — ABNORMAL LOW (ref 98–111)
Creatinine, Ser: 0.67 mg/dL (ref 0.44–1.00)
GFR, Estimated: >60 mL/min (ref >60)
Glucose, Bld: 106 mg/dL — ABNORMAL HIGH (ref 70–99)
Potassium: 3.2 mmol/L — ABNORMAL LOW (ref 3.5–5.1)
Sodium: 134 mmol/L — ABNORMAL LOW (ref 135–145)
Total Bilirubin: 2.1 mg/dL — ABNORMAL HIGH (ref 0.3–1.2)
Total Protein: 6 g/dL — ABNORMAL LOW (ref 6.5–8.1)

## 2020-03-02 LAB — APTT: aPTT: 51 seconds — ABNORMAL HIGH (ref 24–36)

## 2020-03-02 MED ORDER — AMOXICILLIN-POT CLAVULANATE 875-125 MG PO TABS: 1.0000 | ORAL_TABLET | Freq: Two times a day (BID) | ORAL | 0 refills | Status: AC

## 2020-03-02 MED ORDER — POTASSIUM CHLORIDE CRYS ER 20 MEQ PO TBCR: 20.0000 meq | EXTENDED_RELEASE_TABLET | Freq: Every day | ORAL | Status: DC

## 2020-03-02 MED ORDER — HEPARIN BOLUS VIA INFUSION
2000.0000 [IU] | Freq: Once | INTRAVENOUS | Status: AC
  Administered 2020-03-02: 09:00:00 2000 [IU] via INTRAVENOUS
  Filled 2020-03-02: qty 2000

## 2020-03-02 MED ORDER — LOSARTAN POTASSIUM 25 MG PO TABS: 12.5000 mg | ORAL_TABLET | Freq: Every day | ORAL | Status: DC

## 2020-03-02 MED ORDER — DIGOXIN 125 MCG PO TABS: 0.1250 mg | ORAL_TABLET | Freq: Every day | ORAL | 0 refills | Status: DC

## 2020-03-02 MED ORDER — LOSARTAN POTASSIUM 25 MG PO TABS: 12.5000 mg | ORAL_TABLET | Freq: Every day | ORAL | 0 refills | Status: DC

## 2020-03-02 MED ORDER — TORSEMIDE 20 MG PO TABS: 20.0000 mg | ORAL_TABLET | Freq: Two times a day (BID) | ORAL | 0 refills | Status: DC

## 2020-03-02 MED ORDER — METOPROLOL TARTRATE 100 MG PO TABS: 100.0000 mg | ORAL_TABLET | Freq: Two times a day (BID) | ORAL | 0 refills | Status: DC

## 2020-03-02 MED ORDER — RIVAROXABAN 20 MG PO TABS: 20.0000 mg | ORAL_TABLET | Freq: Every day | ORAL | Status: DC

## 2020-03-02 MED ORDER — DIGOXIN 0.25 MG/ML IJ SOLN
0.2500 mg | Freq: Once | INTRAMUSCULAR | Status: AC
  Administered 2020-03-02: 13:00:00 0.25 mg via INTRAVENOUS
  Filled 2020-03-02: qty 2

## 2020-03-02 MED ORDER — POTASSIUM CHLORIDE CRYS ER 20 MEQ PO TBCR
40.0000 meq | EXTENDED_RELEASE_TABLET | Freq: Three times a day (TID) | ORAL | Status: DC
  Administered 2020-03-02: 09:00:00 40 meq via ORAL
  Filled 2020-03-02: qty 2

## 2020-03-02 MED ORDER — DIGOXIN 125 MCG PO TABS: 0.1250 mg | ORAL_TABLET | Freq: Every day | ORAL | Status: DC

## 2020-03-02 NOTE — Consult Note (Signed)
ANTICOAGULATION CONSULT NOTE   Pharmacy Consult for heparin Indication: atrial fibrillation  Allergies  Allergen Reactions  . Pine Tar Cough  . Sulfa Antibiotics Rash  . Tetracyclines & Related Rash    Patient Measurements: Height: 5\' 1"  (154.9 cm) Weight: 69.9 kg (154 lb 3.2 oz) IBW/kg (Calculated) : 47.8 Heparin Dosing Weight: 63.3  Vital Signs: Temp: 97.7 F (36.5 C) (12/30 0755) Temp Source: Oral (12/30 0755) BP: 118/93 (12/30 0755) Pulse Rate: 111 (12/30 0755)  Labs: Recent Labs    02/29/20 0617 03/01/20 0432 03/01/20 1031 03/01/20 2101 03/02/20 0649  HGB 10.7* 11.4*  --   --  11.5*  HCT 34.9* 36.7  --   --  38.1  PLT 611* 661*  --   --  646*  APTT  --   --  27 30 51*  LABPROT 22.7* 17.4*  --   --   --   INR 2.1* 1.5*  --   --   --   HEPARINUNFRC  --   --  0.59  --   --   CREATININE 1.24* 0.79  --   --  0.67    Estimated Creatinine Clearance: 55.1 mL/min (by C-G formula based on SCr of 0.67 mg/dL).   Medical History: Past Medical History:  Diagnosis Date  . H/O blood clots   . Heart murmur   . Hypertension     Medications:  Pt on Xarelto prior to admission. Pt received last dose on 12/25 while inpatient.   Assessment: 74 year old female presenting with shortness of breath, palpitations, abdominal pain, and dysphagia. Pt found to have volume overload including orthopnea and paroxysmal nocturnal dyspnea. 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 Pt in atrial fibrillation since admission. Pharmacy has been consulted to dose heparin for atrial fibrillation.   Baseline aPTT 27, HL 0.59, Hgb 11.4, Plt 661, PT 17.4, INR 1.5  Baseline heparin is falsely elevated due to Xarelto PTA. Will have to monitor heparin infusion with aPTT level until DOAC is washed out.  12/29 2101 aPTT 30 Heparin 1900 units IV X 1 and increase drip rate to 1150 units/hr 12/30 0649 aPTT 51 no heparin level collected.   Goal of Therapy:  aPTT 66-102 Heparin level 0.3-0.7 units/ml  once heparin level and apTT correlate.  Monitor platelets by anticoagulation protocol: Yes   Plan:  APTT is subtherapeutic. Will give heparin bolus of 2000 units x 1 and increase heparin infusion to 1350 units/hr. Recheck aPTT and heparin level in 8 hours, since heparin level was not collected this morning. CBC daily while on heparin.   1/31, PharmD, BCPS 03/02/2020 8:18 AM

## 2020-03-02 NOTE — Discharge Summary (Addendum)
Physician Discharge Summary  Paula Klein [id number] DOB: [birth year] DOA: 02/26/2020  PCP: Marguerita Merles, MD  Admit date: 02/26/2020 Discharge date: 03/02/2020  Admitted From: home  Disposition:  Home w/ home health   Recommendations for Outpatient Follow-up:  1. Follow up with PCP in 1-2 weeks 2. F/u w/ cardio in 1 week 3. F/u w/ general surg, Dr. Arnoldo Morale at [facility] in 10-14 days  Home Health: yes Equipment/Devices:  Discharge Condition: stable  CODE STATUS: full  Diet recommendation: Heart Healthy  Brief/Interim Summary: HPI was taken from Dr. Roosevelt Locks: Paula Klein is a 74 year old female with history of DVT on chronic anticoagulation, essential hypertension who came to the hospital with multiple complaints including short of breath, palpitation, abdominal pain and dysphagia. Patient was previously quite healthy, he developed Covid 2 months ago, was placed on 2 L of oxygen at nighttime.  Since that time, her condition has been deteriorating.  For the last 2 months, patient has not been able to walk due to leg weakness.  She also has frequent paroxysmal nocturnal dyspnea, insomnia.  She has been sleeping in a recliner for the last 2 months.  She also complaining of right upper quadrant abdominal pain intermittently with nausea and poor appetite.  She was diagnosed with gallstone, due to multiple medical problems, she was not a candidate for surgery. For the last week, patient has worsening shortness of breath.  She has a cough, largely nonproductive.  She has gained 10 pounds of body weight in the last 2 months.  She also had increased leg edema. Upon arriving the hospital, her creatinine was 1.4, sodium 130.  AST 398, ALT 276, total protein 1.8, BNP 1132, procalcitonin level 0.12, chest x-ray showed bilateral lower lobe infiltrates, small pleural effusion.  Right upper quadrant ultrasound showed cholecystitis with positive Murphy sign. Telemetry showed atrial  fibrillation with rapid ventricular response.  Troponin 15   Hospital Course from Dr. Jimmye Norman 12/29-12/30/21: Pt was found to have acute on chronic systolic CHF and was treated w/ metoprolol, digoxin & IV lasix. Pt tolerated the treatment fairly well. Also, pt was found to have cholelithiasis w/ cholecystitis and was treated medically w/ IV abxs and will transition to po augmentin at d/c as per general surgery. Pt will f/u w/ Dr. Arnoldo Morale, general surgery, outpatient in 10-14 days. PT/OT saw the pt and recommended home health. Home health was set by CM prior to d/c. For more information, please see previous progress/consult notes.   Discharge Diagnoses:  Principal Problem:   Acute HFrEF (heart failure with reduced ejection fraction) (HCC) Active Problems:   History of DVT of lower extremity   AF (paroxysmal atrial fibrillation) (HCC)   Cholecystitis with cholelithiasis   Dysphagia   Severe protein-calorie malnutrition (HCC)   AKI (acute kidney injury) ([place])   Hyponatremia   Elevated liver enzymes   Atrial fibrillation with rapid ventricular response (HCC)  Acute on chronic systolic CHF: continue on metoprolol, digoxin, losartan & torsemide at d/c as per cardio. Monitor I/Os and continue w/ fluid restriction.   Persistent a. fib: continue on metoprolol, digoxin. Continue w/ tele  Cholelithiasis with cholecystitis: will hold off on cholecystectomy as per general surg. Transition to po augmentin at d/c. MRCP was performed, did not show any common bile duct obstruction but a splenic lesion, with the possibility of hematoma.  Chronic hypoxic respiratory failure: continue on supplemental oxygen  Leukocytosis: likely secondary to above, trending down   Transaminitis: likely secondary to cholelithiasis w/ cholecystitis.  Still elevated but trending down   AKI: resolved  Hyponatremia: resolved   Thrombocytosis: likely reactive. Will continue to monitor   Discharge  Instructions  Discharge Instructions    Diet - low sodium heart healthy   Complete by: As directed    Discharge instructions   Complete by: As directed    F/u w/ PCP in 1-2 weeks. F/u cardio in 1 week. F/u w/ general surgery, Dr. Lovell Sheehan at [facility] in 10-14 days   Increase activity slowly   Complete by: As directed      Allergies as of 03/02/2020      Reactions   Pine Tar Cough   Sulfa Antibiotics Rash   Tetracyclines & Related Rash      Medication List    STOP taking these medications   quiNINE 324 MG capsule Commonly known as: QUALAQUIN     TAKE these medications   albuterol 108 (90 Base) MCG/ACT inhaler Commonly known as: VENTOLIN HFA Inhale 2 puffs into the lungs every 6 (six) hours as needed for shortness of breath.   amoxicillin-clavulanate 875-125 MG tablet Commonly known as: Augmentin Take 1 tablet by mouth 2 (two) times daily for 10 days.   aspirin EC 81 MG tablet Take 81 mg by mouth daily. Swallow whole.   Breo Ellipta 100-25 MCG/INH Aepb Generic drug: fluticasone furoate-vilanterol Inhale 1 puff into the lungs daily.   calcium carbonate 1250 (500 Ca) MG tablet Commonly known as: OS-CAL - dosed in mg of elemental calcium Take 1 tablet by mouth daily.   clonazePAM 0.5 MG tablet Commonly known as: KLONOPIN Take 0.5 mg by mouth daily as needed for anxiety.   digoxin 0.125 MG tablet Commonly known as: LANOXIN Take 1 tablet (0.125 mg total) by mouth daily. Start taking on: March 03, 2020   estradiol 0.5 MG tablet Commonly known as: ESTRACE Take 0.5 mg by mouth daily.   losartan 25 MG tablet Commonly known as: COZAAR Take 0.5 tablets (12.5 mg total) by mouth daily.   medroxyPROGESTERone 2.5 MG tablet Commonly known as: PROVERA Take 2.5 mg by mouth daily.   metoprolol tartrate 100 MG tablet Commonly known as: LOPRESSOR Take 1 tablet (100 mg total) by mouth 2 (two) times daily. What changed:   medication  strength  how much to take   torsemide 20 MG tablet Commonly known as: Demadex Take 1 tablet (20 mg total) by mouth 2 (two) times daily.   vitamin C 1000 MG tablet Take 1,000 mg by mouth daily.   VITAMIN D3 PO Take 500 Units by mouth daily.   vitamin E 1000 UNIT capsule Take 1,000 Units by mouth daily.   Xarelto 10 MG Tabs tablet Generic drug: rivaroxaban Take 2 tablets (20 mg total) by mouth daily. What changed: how much to take       Follow-up Information    [facility] Follow up on 03/22/2020.   Specialty: Cardiology Why: at 11:30am. Enter through the Medical Mall entrance Contact information: [address] [place] [address] [phone]       Jobe Gibbon, [facility] Follow up.   Specialty: Home Health Services Why: Physical Therapy, Occupational Therapy Contact information: [address] [place] [address] [phone]              Allergies  Allergen Reactions  . Pine Tar Cough  . Sulfa Antibiotics Rash  . Tetracyclines & Related Rash  Consultations:  Cardio  General surg    Procedures/Studies: CT ABDOMEN PELVIS WO CONTRAST  Result Date: 02/28/2020 CLINICAL DATA:  Leukocytosis. EXAM: CT ABDOMEN AND PELVIS WITHOUT CONTRAST TECHNIQUE: Multidetector CT imaging of the abdomen and pelvis was performed following the standard protocol without IV contrast. COMPARISON:  January 03, 2020. FINDINGS: Lower chest: Moderate bilateral pleural effusions are noted, right greater than left, with associated right basilar atelectasis or infiltrate. Hepatobiliary: Cholelithiasis is noted. No biliary dilatation is noted. Several hepatic cysts are noted and stable. Pancreas: Unremarkable. No pancreatic ductal dilatation or surrounding inflammatory changes. Spleen: Calcified splenic granulomata are again noted. There appears to be interval development of fluid density  measuring 5.7 x 4.4 cm seen involving the superior aspect of the spleen. This is concerning for possible abscess or perisplenic hematoma. Adrenals/Urinary Tract: Adrenal glands are unremarkable. Kidneys are normal, without renal calculi, focal lesion, or hydronephrosis. Bladder is unremarkable. Stomach/Bowel: Stomach appears normal. There is no evidence of bowel obstruction or inflammation. The appendix is not visualized, but no inflammation is noted in the right lower quadrant. Vascular/Lymphatic: Aortic atherosclerosis. No enlarged abdominal or pelvic lymph nodes. Reproductive: Intrauterine device is noted. No adnexal abnormality is noted. Other: No abdominal wall hernia or abnormality. No abdominopelvic ascites. Musculoskeletal: No acute or significant osseous findings. IMPRESSION: 1. Moderate bilateral pleural effusions are noted, right greater than left, with associated right basilar atelectasis or infiltrate. 2. Interval development of 5.7 x 4.4 cm fluid density seen involving the superior aspect of the spleen concerning for possible abscess or perisplenic hematoma. CT scan of the abdomen with intravenous contrast is recommended for further evaluation. 3. Cholelithiasis. 4. Aortic atherosclerosis. Aortic Atherosclerosis (ICD10-I70.0). Electronically Signed   By: Lupita Raider M.D.   On: 02/28/2020 09:15   MR ABDOMEN MRCP WO CONTRAST  Result Date: 02/29/2020 CLINICAL DATA:  Gallstones with sonographic Murphy's sign. New splenic hypodense lesion on CT. EXAM: MRI ABDOMEN WITHOUT CONTRAST  (INCLUDING MRCP) TECHNIQUE: Multiplanar multisequence MR imaging of the abdomen was performed. Heavily T2-weighted images of the biliary and pancreatic ducts were obtained, and three-dimensional MRCP images were rendered by post processing. COMPARISON:  Multiple exams, including 02/28/2020 CT and 02/26/2020 ultrasound FINDINGS: Lower chest: Substantial bilateral pleural effusions with passive atelectasis. Mild  cardiomegaly. Hepatobiliary: Multiple sharply defined T2 hyperintense hepatic lesions favoring cysts. Some such the 3.3 by 2.2 cm cystic lesion inferiorly in the right hepatic lobe on image 27 of series 3 may have associated faint internal septation. No obvious internal nodularity. Multiple gallstones measuring up to about 0.8 cm in diameter are subtle dependently in the gallbladder. Common bile duct up to 0.6 cm in diameter (upper normal for age) with slightly abrupt distal truncation rather than conical tapering at the tip, but without a well-defined low signal intensity within the CBD to further favor choledocholithiasis. No intrahepatic biliary dilatation. Pancreas:  Unremarkable Spleen: 4.1 by 2.3 cm peripheral primarily fluid lesion with high T2 and intermediate to high T1 signal intensity peripherally in the spleen, with a low signal intensity somewhat irregular rim on diffusion-weighted images. Irregular similar signal in the anterior inferior spleen in a bandlike configuration. Medially in the spleen there is a 2.3 by 2.1 cm region of reduced T1 and reduced T2 signal representing another splenic lesion, with a similar appearance inferiorly. Adrenals/Urinary Tract:  Unremarkable Stomach/Bowel: Unremarkable Vascular/Lymphatic:  Aortoiliac atherosclerotic vascular disease. Other: Bilateral flank edema in the subcutaneous tissues. Low-grade retroperitoneal edema. Musculoskeletal: Levoconvex lumbar scoliosis with spondylosis and degenerative disc disease. Nonspecific 8  mm T2 hyperintense lesion eccentric to the left in the L4 vertebral body. IMPRESSION: 1. Cholelithiasis. No biliary dilatation or definite choledocholithiasis. There is slightly abrupt distal truncation of the common bile duct rather than conical tapering at the tip of the CBD, but without a well-defined low signal intensity CBD to further indicate choledocholithiasis. 2. Multiple hepatic cysts, some with mild septation. Strictly speaking,  cystadenoma is not excluded. 3. Complex splenic lesions with high T2 and intermediate to high T1 signal intensity, and are new from 01/03/2020. Top differential diagnostic considerations include splenic hematomas (potentially in the setting of prior splenic infarcts) versus less likely splenic abscesses. No gas is identified in these lesions. Today's exam was not performed with IV contrast, which can reduced specificity. 4. Substantial bilateral pleural effusions with passive atelectasis. 5. Mild cardiomegaly. 6. Nonspecific 8 mm T2 hyperintense lesion eccentric to the left in the L4 vertebral body. This could be an atypical hemangioma but is nonspecific. If the patient has a history of malignancy with predilection for bony spread such as breast or lung cancer, nuclear medicine bone scan may be helpful for further characterization. Electronically Signed   By: Van Clines M.D.   On: 02/29/2020 08:14   MR 3D Recon At Scanner  Result Date: 02/29/2020 CLINICAL DATA:  Gallstones with sonographic Murphy's sign. New splenic hypodense lesion on CT. EXAM: MRI ABDOMEN WITHOUT CONTRAST  (INCLUDING MRCP) TECHNIQUE: Multiplanar multisequence MR imaging of the abdomen was performed. Heavily T2-weighted images of the biliary and pancreatic ducts were obtained, and three-dimensional MRCP images were rendered by post processing. COMPARISON:  Multiple exams, including 02/28/2020 CT and 02/26/2020 ultrasound FINDINGS: Lower chest: Substantial bilateral pleural effusions with passive atelectasis. Mild cardiomegaly. Hepatobiliary: Multiple sharply defined T2 hyperintense hepatic lesions favoring cysts. Some such the 3.3 by 2.2 cm cystic lesion inferiorly in the right hepatic lobe on image 27 of series 3 may have associated faint internal septation. No obvious internal nodularity. Multiple gallstones measuring up to about 0.8 cm in diameter are subtle dependently in the gallbladder. Common bile duct up to 0.6 cm in diameter  (upper normal for age) with slightly abrupt distal truncation rather than conical tapering at the tip, but without a well-defined low signal intensity within the CBD to further favor choledocholithiasis. No intrahepatic biliary dilatation. Pancreas:  Unremarkable Spleen: 4.1 by 2.3 cm peripheral primarily fluid lesion with high T2 and intermediate to high T1 signal intensity peripherally in the spleen, with a low signal intensity somewhat irregular rim on diffusion-weighted images. Irregular similar signal in the anterior inferior spleen in a bandlike configuration. Medially in the spleen there is a 2.3 by 2.1 cm region of reduced T1 and reduced T2 signal representing another splenic lesion, with a similar appearance inferiorly. Adrenals/Urinary Tract:  Unremarkable Stomach/Bowel: Unremarkable Vascular/Lymphatic:  Aortoiliac atherosclerotic vascular disease. Other: Bilateral flank edema in the subcutaneous tissues. Low-grade retroperitoneal edema. Musculoskeletal: Levoconvex lumbar scoliosis with spondylosis and degenerative disc disease. Nonspecific 8 mm T2 hyperintense lesion eccentric to the left in the L4 vertebral body. IMPRESSION: 1. Cholelithiasis. No biliary dilatation or definite choledocholithiasis. There is slightly abrupt distal truncation of the common bile duct rather than conical tapering at the tip of the CBD, but without a well-defined low signal intensity CBD to further indicate choledocholithiasis. 2. Multiple hepatic cysts, some with mild septation. Strictly speaking, cystadenoma is not excluded. 3. Complex splenic lesions with high T2 and intermediate to high T1 signal intensity, and are new from 01/03/2020. Top differential diagnostic considerations include splenic hematomas (  potentially in the setting of prior splenic infarcts) versus less likely splenic abscesses. No gas is identified in these lesions. Today's exam was not performed with IV contrast, which can reduced specificity. 4.  Substantial bilateral pleural effusions with passive atelectasis. 5. Mild cardiomegaly. 6. Nonspecific 8 mm T2 hyperintense lesion eccentric to the left in the L4 vertebral body. This could be an atypical hemangioma but is nonspecific. If the patient has a history of malignancy with predilection for bony spread such as breast or lung cancer, nuclear medicine bone scan may be helpful for further characterization. Electronically Signed   By: Van Clines M.D.   On: 02/29/2020 08:14   DG Chest Portable 1 View  Result Date: 02/26/2020 CLINICAL DATA:  Weakness EXAM: PORTABLE CHEST 1 VIEW COMPARISON:  12/31/2019 FINDINGS: Stable heart size. Atherosclerotic calcification of the aortic knob. Low lung volumes. Mild interstitial prominence bilaterally. Small bilateral pleural effusions, left greater than right. Associated bibasilar opacities. Aeration of the mid to upper lung fields has improved compared to the previous radiograph. No pneumothorax. IMPRESSION: Small bilateral pleural effusions, left greater than right. Associated bibasilar opacities, may represent atelectasis and/or pneumonia. Electronically Signed   By: Davina Poke D.O.   On: 02/26/2020 12:59   ECHOCARDIOGRAM COMPLETE  Result Date: 02/24/2020    ECHOCARDIOGRAM REPORT   Patient Name:   MAYMIE VORNDRAN Date of Exam: 02/22/2020 Medical Rec #:  [id number]      Height:       61.0 in Accession #:    [id number]     Weight:       158.0 lb Date of Birth:  02-Nov-1945      BSA:          1.709 m Patient Age:    36 years       BP:           106/62 mmHg Patient Gender: F              HR:           120 bpm. Exam Location:  [facility] Procedure: 2D Echo, Color Doppler and Cardiac Doppler Indications:    R00.0 Tachycardia; I48.91* Unspeicified atrial fibrillation  History:        Patient has no prior history of Echocardiogram examinations.                 Signs/Symptoms:Shortness of Breath; Risk Factors:Hypertension                 and Former Smoker.   Sonographer:    Pilar Jarvis RDMS, RVT, RDCS Referring Phys: [id number] [facility]  Sonographer Comments: Dr Fletcher Anon alerted afib rate, MR and effusion IMPRESSIONS  1. Left ventricular ejection fraction, by estimation, is 20 to 25%. The left ventricle has severely decreased function. The left ventricle has no regional wall motion abnormalities. Left ventricular diastolic parameters are indeterminate.  2. Right ventricular systolic function is mildly reduced. The right ventricular size is mildly enlarged. There is mildly elevated pulmonary artery systolic pressure. The estimated right ventricular systolic pressure is [id number] mmHg.  3. Left atrial size was moderately dilated.  4. Right atrial size was moderately dilated.  5. A small pericardial effusion is present. The pericardial effusion is posterior to the left ventricle. There is no evidence of cardiac tamponade. Moderate pleural effusion in the left lateral region.  6. The mitral valve is normal in structure. Moderate mitral valve regurgitation. No evidence of mitral stenosis.  7. The aortic valve is normal in  structure. Aortic valve regurgitation is not visualized. No aortic stenosis is present.  8. The inferior vena cava is dilated in size with <50% respiratory variability, suggesting right atrial pressure of 15 mmHg. FINDINGS  Left Ventricle: Left ventricular ejection fraction, by estimation, is 20 to 25%. The left ventricle has severely decreased function. The left ventricle has no regional wall motion abnormalities. The left ventricular internal cavity size was normal in size. There is no left ventricular hypertrophy. Left ventricular diastolic parameters are indeterminate. Right Ventricle: The right ventricular size is mildly enlarged. No increase in right ventricular wall thickness. Right ventricular systolic function is mildly reduced. There is mildly elevated pulmonary artery systolic pressure. The tricuspid regurgitant  velocity is 2.23 m/s, and with an  assumed right atrial pressure of 15 mmHg, the estimated right ventricular systolic pressure is [id number] mmHg. Left Atrium: Left atrial size was moderately dilated. Right Atrium: Right atrial size was moderately dilated. Pericardium: A small pericardial effusion is present. The pericardial effusion is posterior to the left ventricle. There is no evidence of cardiac tamponade. Mitral Valve: The mitral valve is normal in structure. There is moderate thickening of the mitral valve leaflet(s). Moderate mitral valve regurgitation. No evidence of mitral valve stenosis. Tricuspid Valve: The tricuspid valve is normal in structure. Tricuspid valve regurgitation is mild . No evidence of tricuspid stenosis. Aortic Valve: The aortic valve is normal in structure. Aortic valve regurgitation is not visualized. No aortic stenosis is present. Aortic valve mean gradient measures 1.0 mmHg. Aortic valve peak gradient measures 1.7 mmHg. Aortic valve area, by VTI measures 3.54 cm. Pulmonic Valve: The pulmonic valve was normal in structure. Pulmonic valve regurgitation is mild. No evidence of pulmonic stenosis. Aorta: The aortic root is normal in size and structure. Venous: The inferior vena cava is dilated in size with less than 50% respiratory variability, suggesting right atrial pressure of 15 mmHg. IAS/Shunts: No atrial level shunt detected by color flow Doppler. Additional Comments: There is a moderate pleural effusion in the left lateral region.  LEFT VENTRICLE PLAX 2D LVOT diam:     2.10 cm LV SV:         27 LV SV Index:   16 LVOT Area:     3.46 cm  LV Volumes (MOD) LV vol d, MOD A2C: 90.1 ml LV vol d, MOD A4C: 80.0 ml LV vol s, MOD A2C: 68.9 ml LV vol s, MOD A4C: 64.5 ml LV SV MOD A2C:     21.2 ml LV SV MOD A4C:     80.0 ml LV SV MOD BP:      16.8 ml RIGHT VENTRICLE            IVC RV Basal diam:  4.15 cm    IVC diam: 2.30 cm RV S prime:     5.87 cm/s TAPSE (M-mode): 1.1 cm LEFT ATRIUM             Index       RIGHT ATRIUM            Index LA diam:        4.60 cm 2.69 cm/m  RA Area:     21.85 cm LA Vol (A2C):   65.5 ml 38.33 ml/m RA Volume:   69.80 ml  40.85 ml/m LA Vol (A4C):   56.6 ml 33.12 ml/m LA Biplane Vol: 63.4 ml 37.10 ml/m  AORTIC VALVE AV Area (Vmax):    3.30 cm AV Area (Vmean):   2.85 cm AV Area (VTI):  3.54 cm AV Vmax:           65.40 cm/s AV Vmean:          45.400 cm/s AV VTI:            0.077 m AV Peak Grad:      1.7 mmHg AV Mean Grad:      1.0 mmHg LVOT Vmax:         62.35 cm/s LVOT Vmean:        37.400 cm/s LVOT VTI:          0.079 m LVOT/AV VTI ratio: 1.02  AORTA Ao Root diam: 3.40 cm Ao Asc diam:  3.40 cm Ao Arch diam: 2.8 cm TRICUSPID VALVE TR Peak grad:   19.9 mmHg TR Vmax:        223.00 cm/s  SHUNTS Systemic VTI:  0.08 m Systemic Diam: 2.10 cm Kathlyn Sacramento MD Electronically signed by Kathlyn Sacramento MD Signature Date/Time: 02/24/2020/7:22:16 AM    Final    US LIVER DOPPLER  Result Date: 02/29/2020 CLINICAL DATA:  Evaluate hepatic vasculature. Evaluate for cholecystitis. EXAM: 1. ULTRASOUND ABDOMEN LIMITED RIGHT UPPER QUADRANT 2. DUPLEX ULTRASOUND OF LIVER TECHNIQUE: Standard right upper quadrant abdominal ultrasound was performed followed by color and duplex Doppler ultrasound to evaluate the hepatic in-flow and out-flow vessels. COMPARISON:  Right upper quadrant abdominal ultrasound-01/03/2020; 02/26/2020; CT abdomen pelvis-02/28/2020; MRCP-02/28/2020 FINDINGS: Gallbladder: Redemonstrated multiple echogenic layering gallstones within otherwise normal-appearing gallbladder. Index gallstone measures approximately 1.1 cm in greatest short axis diameter. No gallbladder wall thickening or pericholecystic stranding. Evaluation for sonographic Percell Miller sign is degraded secondary to patient's mental status. Common bile duct: Diameter: Normal in size measuring 2.4 mm in diameter Liver: Mild nodularity of the hepatic contour. Note is made of 2 punctate (approximately 1.0 cm) anechoic cysts within the left lobe of the  liver. Note is made of two partially septated, minimally complex cyst within the right lobe of the liver with dominant cyst measuring approximately 3.4 x 3.1 x 2.8 cm, better demonstrated on preceding MRCP. Other: Note is made of a moderate-sized right-sided pleural effusion. _________________________________________________________ Main Portal Vein size: 1.0 cm cm Portal Vein Velocities (normal hepatopetal directional flow) Main Prox:  33 cm/sec Main Mid: 18 cm/sec Main Dist:  19 cm/sec Right: 19 cm/sec Left: 19 cm/sec Hepatic Vein Velocities (normal hepatofugal directional flow) Right:  28 cm/sec Middle:  28 cm/sec Left:  49 cm/sec IVC: Present and patent with normal respiratory phasicity. Hepatic Artery Velocity: 99 cm/sec Splenic Vein Velocity: 40 cm/sec Spleen: 6.9 cm x 3.9 cm x 5.0 cm with a total volume of 67 cm^3 (411 cm^3 is upper limit normal) Portal Vein Occlusion/Thrombus: No Splenic Vein Occlusion/Thrombus: No Ascites: None Varices: None IMPRESSION: 1. Patent hepatic vasculature with normal directional flow. 2. Cholelithiasis without sonographic evidence of acute cholecystitis though evaluation for sonographic Murphy sign degraded secondary to altered mental status. If there remains clinical concern for acute cholecystitis, further evaluation nuclear medicine HIDA scan could be performed as indicated. 3. Scattered hepatic cysts, several of which appear minimally complex/partially septated, better evaluated on preceding MRCP. 4. Moderate sized right-sided pleural effusion as demonstrated on preceding abdominal CT. Electronically Signed   By: Sandi Mariscal M.D.   On: 02/29/2020 10:02   US Abdomen Limited RUQ (LIVER/GB)  Result Date: 02/29/2020 CLINICAL DATA:  Evaluate hepatic vasculature. Evaluate for cholecystitis. EXAM: 1. ULTRASOUND ABDOMEN LIMITED RIGHT UPPER QUADRANT 2. DUPLEX ULTRASOUND OF LIVER TECHNIQUE: Standard right upper quadrant abdominal ultrasound was performed followed by  color and  duplex Doppler ultrasound to evaluate the hepatic in-flow and out-flow vessels. COMPARISON:  Right upper quadrant abdominal ultrasound-01/03/2020; 02/26/2020; CT abdomen pelvis-02/28/2020; MRCP-02/28/2020 FINDINGS: Gallbladder: Redemonstrated multiple echogenic layering gallstones within otherwise normal-appearing gallbladder. Index gallstone measures approximately 1.1 cm in greatest short axis diameter. No gallbladder wall thickening or pericholecystic stranding. Evaluation for sonographic Percell Miller sign is degraded secondary to patient's mental status. Common bile duct: Diameter: Normal in size measuring 2.4 mm in diameter Liver: Mild nodularity of the hepatic contour. Note is made of 2 punctate (approximately 1.0 cm) anechoic cysts within the left lobe of the liver. Note is made of two partially septated, minimally complex cyst within the right lobe of the liver with dominant cyst measuring approximately 3.4 x 3.1 x 2.8 cm, better demonstrated on preceding MRCP. Other: Note is made of a moderate-sized right-sided pleural effusion. _________________________________________________________ Main Portal Vein size: 1.0 cm cm Portal Vein Velocities (normal hepatopetal directional flow) Main Prox:  33 cm/sec Main Mid: 18 cm/sec Main Dist:  19 cm/sec Right: 19 cm/sec Left: 19 cm/sec Hepatic Vein Velocities (normal hepatofugal directional flow) Right:  28 cm/sec Middle:  28 cm/sec Left:  49 cm/sec IVC: Present and patent with normal respiratory phasicity. Hepatic Artery Velocity: 99 cm/sec Splenic Vein Velocity: 40 cm/sec Spleen: 6.9 cm x 3.9 cm x 5.0 cm with a total volume of 67 cm^3 (411 cm^3 is upper limit normal) Portal Vein Occlusion/Thrombus: No Splenic Vein Occlusion/Thrombus: No Ascites: None Varices: None IMPRESSION: 1. Patent hepatic vasculature with normal directional flow. 2. Cholelithiasis without sonographic evidence of acute cholecystitis though evaluation for sonographic Murphy sign degraded secondary to  altered mental status. If there remains clinical concern for acute cholecystitis, further evaluation nuclear medicine HIDA scan could be performed as indicated. 3. Scattered hepatic cysts, several of which appear minimally complex/partially septated, better evaluated on preceding MRCP. 4. Moderate sized right-sided pleural effusion as demonstrated on preceding abdominal CT. Electronically Signed   By: Sandi Mariscal M.D.   On: 02/29/2020 10:02   US Abdomen Limited RUQ (LIVER/GB)  Result Date: 02/26/2020 CLINICAL DATA:  RIGHT upper quadrant pain. EXAM: ULTRASOUND ABDOMEN LIMITED RIGHT UPPER QUADRANT COMPARISON:  None. FINDINGS: Gallbladder: Multiple shadowing gallstones within lumen the gallbladder measuring size from 5-10 mm. Positive sonographic Murphy's sign. No pericholecystic fluid. Common bile duct: Diameter: Normal at 4 mm Liver: Mildly complex cyst in the RIGHT hepatic lobe corresponds to comparison CT. Liver parenchyma within normal limits in parenchymal echogenicity. Portal vein is patent on color Doppler imaging with normal direction of blood flow towards the liver. Other: Trace ascites.  RIGHT pleural effusion. IMPRESSION: 1. Multiple gallstones and positive sonographic Murphy's sign. Evaluate for acute cholecystitis. 2. Normal common bile duct. 3. RIGHT effusion and trace ascites. Electronically Signed   By: Suzy Bouchard M.D.   On: 02/26/2020 13:19       Subjective: Pt c/o malaise   Discharge Exam: Vitals:   03/02/20 0755 03/02/20 1202  BP: (!) 118/93 (!) 116/93  Pulse: (!) 111 93  Resp: 16 16  Temp: 97.7 F (36.5 C) 97.6 F (36.4 C)  SpO2: 99% 98%   Vitals:   03/01/20 2321 03/02/20 0425 03/02/20 0755 03/02/20 1202  BP: 121/88 122/84 (!) 118/93 (!) 116/93  Pulse: (!) 107 100 (!) 111 93  Resp:  18 16 16   Temp:  97.6 F (36.4 C) 97.7 F (36.5 C) 97.6 F (36.4 C)  TempSrc:  Oral Oral Oral  SpO2:  100% 99% 98%  Weight:  69.9  kg    Height:        General: Pt is alert,  awake, not in acute distress Cardiovascular:  S1/S2 +, no rubs, no gallops Respiratory: CTA bilaterally, no wheezing, no rhonchi Abdominal: Soft, NT, ND, bowel sounds + Extremities:  no cyanosis    The results of significant diagnostics from this hospitalization (including imaging, microbiology, ancillary and laboratory) are listed below for reference.     Microbiology: Recent Results (from the past 240 hour(s))  Resp Panel by RT-PCR (Flu A&B, Covid) Nasopharyngeal Swab     Status: None   Collection Time: 02/26/20 12:43 PM   Specimen: Nasopharyngeal Swab; Nasopharyngeal(NP) swabs in vial transport medium  Result Value Ref Range Status   SARS Coronavirus 2 by RT PCR NEGATIVE NEGATIVE Final    Comment: (NOTE) SARS-CoV-2 target nucleic acids are NOT DETECTED.  The SARS-CoV-2 RNA is generally detectable in upper respiratory specimens during the acute phase of infection. The lowest concentration of SARS-CoV-2 viral copies this assay can detect is 138 copies/mL. A negative result does not preclude SARS-Cov-2 infection and should not be used as the sole basis for treatment or other patient management decisions. A negative result may occur with  improper specimen collection/handling, submission of specimen other than nasopharyngeal swab, presence of viral mutation(s) within the areas targeted by this assay, and inadequate number of viral copies(<138 copies/mL). A negative result must be combined with clinical observations, patient history, and epidemiological information. The expected result is Negative.  Fact Sheet for Patients:  EntrepreneurPulse.com.au  Fact Sheet for Healthcare Providers:  IncredibleEmployment.be  This test is no t yet approved or cleared by the [place] FDA and  has been authorized for detection and/or diagnosis of SARS-CoV-2 by FDA under an Emergency Use Authorization (EUA). This EUA will remain  in effect (meaning this  test can be used) for the duration of the COVID-19 declaration under Section 564(b)(1) of the Act, 21 U.S.C.section 360bbb-3(b)(1), unless the authorization is terminated  or revoked sooner.       Influenza A by PCR NEGATIVE NEGATIVE Final   Influenza B by PCR NEGATIVE NEGATIVE Final    Comment: (NOTE) The Xpert Xpress SARS-CoV-2/FLU/RSV plus assay is intended as an aid in the diagnosis of influenza from Nasopharyngeal swab specimens and should not be used as a sole basis for treatment. Nasal washings and aspirates are unacceptable for Xpert Xpress SARS-CoV-2/FLU/RSV testing.  Fact Sheet for Patients: EntrepreneurPulse.com.au  Fact Sheet for Healthcare Providers: IncredibleEmployment.be  This test is not yet approved or cleared by the [place] FDA and has been authorized for detection and/or diagnosis of SARS-CoV-2 by FDA under an Emergency Use Authorization (EUA). This EUA will remain in effect (meaning this test can be used) for the duration of the COVID-19 declaration under Section 564(b)(1) of the Act, 21 U.S.C. section 360bbb-3(b)(1), unless the authorization is terminated or revoked.  Performed at [facility], [address] [place], [place] [address]   CULTURE, BLOOD (ROUTINE X 2) w Reflex to ID Panel     Status: None (Preliminary result)   Collection Time: 02/28/20  1:43 PM   Specimen: BLOOD  Result Value Ref Range Status   Specimen Description BLOOD RIGHT ANTECUBITAL  Final   Special Requests   Final    BOTTLES DRAWN AEROBIC AND ANAEROBIC Blood Culture adequate volume   Culture   Final    NO GROWTH 3 DAYS Performed at [facility], [address], [place], [place] [address]    Report  Status PENDING  Incomplete  CULTURE, BLOOD (ROUTINE X 2) w Reflex to ID Panel     Status: None (Preliminary result)   Collection Time: 02/28/20  1:44 PM   Specimen: BLOOD  Result Value Ref Range Status   Specimen  Description BLOOD LEFT ANTECUBITAL  Final   Special Requests   Final    BOTTLES DRAWN AEROBIC AND ANAEROBIC Blood Culture adequate volume   Culture   Final    NO GROWTH 3 DAYS Performed at [facility], [address] [place], [place] [address]    Report Status PENDING  Incomplete     Labs: BNP (last 3 results) Recent Labs    02/26/20 1234  BNP [id number]*   Basic Metabolic Panel: Recent Labs  Lab 02/27/20 0344 02/28/20 0357 02/29/20 0617 03/01/20 0432 03/02/20 0649  NA 129* 126* 130* 136 134*  K 3.3* 4.6 4.2 3.5 3.2*  CL 90* 90* 92* 96* 92*  CO2 25 24 27 30  33*  GLUCOSE 119* 107* 107* 104* 106*  BUN 31* 42* 38* 27* 20  CREATININE 1.46* 1.63* 1.24* 0.79 0.67  CALCIUM 9.0 9.1 9.2 9.1 8.9  MG 2.0 2.1 2.3 2.0 1.8   Liver Function Tests: Recent Labs  Lab 02/26/20 1234 02/28/20 0357 02/29/20 0617 03/01/20 0432 03/02/20 0649  AST 398* 1,059* 480* 279* 174*  ALT 276* 737* 534* 412* 303*  ALKPHOS 95 101 93 94 91  BILITOT 1.8* 1.7* 2.0* 2.0* 2.1*  PROT 7.0 6.1* 6.2* 6.0* 6.0*  ALBUMIN 3.4* 3.1* 3.0* 2.9* 2.8*   Recent Labs  Lab 02/26/20 1234  LIPASE 26   No results for input(s): AMMONIA in the last 168 hours. CBC: Recent Labs  Lab 02/26/20 1552 02/28/20 0357 02/29/20 0617 03/01/20 0432 03/02/20 0649  WBC 13.6* 17.9* 16.1* 15.6* 13.5*  NEUTROABS 9.5* 13.9* 12.6* 11.7*  --   HGB 11.4* 11.1* 10.7* 11.4* 11.5*  HCT 37.2 34.1* 34.9* 36.7 38.1  MCV 78.8* 76.6* 78.4* 78.4* 79.7*  PLT 621* 610* 611* 661* 646*   Cardiac Enzymes: No results for input(s): CKTOTAL, CKMB, CKMBINDEX, TROPONINI in the last 168 hours. BNP: Invalid input(s): POCBNP CBG: No results for input(s): GLUCAP in the last 168 hours. D-Dimer No results for input(s): DDIMER in the last 72 hours. Hgb A1c No results for input(s): HGBA1C in the last 72 hours. Lipid Profile No results for input(s): CHOL, HDL, LDLCALC, TRIG, CHOLHDL, LDLDIRECT in the last 72 hours. Thyroid function  studies No results for input(s): TSH, T4TOTAL, T3FREE, THYROIDAB in the last 72 hours.  Invalid input(s): FREET3 Anemia work up No results for input(s): VITAMINB12, FOLATE, FERRITIN, TIBC, IRON, RETICCTPCT in the last 72 hours. Urinalysis    Component Value Date/Time   COLORURINE YELLOW 12/31/2019 1339   APPEARANCEUR HAZY (A) 12/31/2019 1339   LABSPEC 1.014 12/31/2019 1339   PHURINE 6.0 12/31/2019 1339   GLUCOSEU NEGATIVE 12/31/2019 1339   HGBUR SMALL (A) 12/31/2019 1339   BILIRUBINUR NEGATIVE 12/31/2019 1339   KETONESUR 20 (A) 12/31/2019 1339   PROTEINUR 30 (A) 12/31/2019 1339   NITRITE NEGATIVE 12/31/2019 1339   LEUKOCYTESUR NEGATIVE 12/31/2019 1339   Sepsis Labs Invalid input(s): PROCALCITONIN,  WBC,  LACTICIDVEN Microbiology Recent Results (from the past 240 hour(s))  Resp Panel by RT-PCR (Flu A&B, Covid) Nasopharyngeal Swab     Status: None   Collection Time: 02/26/20 12:43 PM   Specimen: Nasopharyngeal Swab; Nasopharyngeal(NP) swabs in vial transport medium  Result Value Ref Range Status   SARS Coronavirus 2 by  RT PCR NEGATIVE NEGATIVE Final    Comment: (NOTE) SARS-CoV-2 target nucleic acids are NOT DETECTED.  The SARS-CoV-2 RNA is generally detectable in upper respiratory specimens during the acute phase of infection. The lowest concentration of SARS-CoV-2 viral copies this assay can detect is 138 copies/mL. A negative result does not preclude SARS-Cov-2 infection and should not be used as the sole basis for treatment or other patient management decisions. A negative result may occur with  improper specimen collection/handling, submission of specimen other than nasopharyngeal swab, presence of viral mutation(s) within the areas targeted by this assay, and inadequate number of viral copies(<138 copies/mL). A negative result must be combined with clinical observations, patient history, and epidemiological information. The expected result is Negative.  Fact Sheet for  Patients:  EntrepreneurPulse.com.au  Fact Sheet for Healthcare Providers:  IncredibleEmployment.be  This test is no t yet approved or cleared by the [place] FDA and  has been authorized for detection and/or diagnosis of SARS-CoV-2 by FDA under an Emergency Use Authorization (EUA). This EUA will remain  in effect (meaning this test can be used) for the duration of the COVID-19 declaration under Section 564(b)(1) of the Act, 21 U.S.C.section 360bbb-3(b)(1), unless the authorization is terminated  or revoked sooner.       Influenza A by PCR NEGATIVE NEGATIVE Final   Influenza B by PCR NEGATIVE NEGATIVE Final    Comment: (NOTE) The Xpert Xpress SARS-CoV-2/FLU/RSV plus assay is intended as an aid in the diagnosis of influenza from Nasopharyngeal swab specimens and should not be used as a sole basis for treatment. Nasal washings and aspirates are unacceptable for Xpert Xpress SARS-CoV-2/FLU/RSV testing.  Fact Sheet for Patients: EntrepreneurPulse.com.au  Fact Sheet for Healthcare Providers: IncredibleEmployment.be  This test is not yet approved or cleared by the [place] FDA and has been authorized for detection and/or diagnosis of SARS-CoV-2 by FDA under an Emergency Use Authorization (EUA). This EUA will remain in effect (meaning this test can be used) for the duration of the COVID-19 declaration under Section 564(b)(1) of the Act, 21 U.S.C. section 360bbb-3(b)(1), unless the authorization is terminated or revoked.  Performed at [facility], [address] [place], [place] [address]   CULTURE, BLOOD (ROUTINE X 2) w Reflex to ID Panel     Status: None (Preliminary result)   Collection Time: 02/28/20  1:43 PM   Specimen: BLOOD  Result Value Ref Range Status   Specimen Description BLOOD RIGHT ANTECUBITAL  Final   Special Requests   Final    BOTTLES DRAWN AEROBIC AND ANAEROBIC Blood  Culture adequate volume   Culture   Final    NO GROWTH 3 DAYS Performed at [facility], [address], [place], [place] [address]    Report Status PENDING  Incomplete  CULTURE, BLOOD (ROUTINE X 2) w Reflex to ID Panel     Status: None (Preliminary result)   Collection Time: 02/28/20  1:44 PM   Specimen: BLOOD  Result Value Ref Range Status   Specimen Description BLOOD LEFT ANTECUBITAL  Final   Special Requests   Final    BOTTLES DRAWN AEROBIC AND ANAEROBIC Blood Culture adequate volume   Culture   Final    NO GROWTH 3 DAYS Performed at [facility], [address], [place], [place] [address]    Report Status PENDING  Incomplete     Time coordinating discharge: Over 30 minutes  SIGNED:   Wyvonnia Dusky, MD  [facility] 03/02/2020, 1:51 PM Pager  If 7PM-7AM, please contact night-coverage

## 2020-03-02 NOTE — Plan of Care (Signed)
  Problem: Education: Goal: Knowledge of General Education information will improve Description: Including pain rating scale, medication(s)/side effects and non-pharmacologic comfort measures Outcome: Progressing   Problem: Clinical Measurements: Goal: Will remain free from infection Outcome: Progressing   Problem: Coping: Goal: Level of anxiety will decrease Outcome: Progressing   Problem: Elimination: Goal: Will not experience complications related to urinary retention Outcome: Progressing   Problem: Pain Managment: Goal: General experience of comfort will improve Outcome: Progressing   Problem: Safety: Goal: Ability to remain free from injury will improve Outcome: Progressing   Problem: Education: Goal: Ability to demonstrate management of disease process will improve Outcome: Progressing Goal: Ability to verbalize understanding of medication therapies will improve Outcome: Progressing

## 2020-03-02 NOTE — Progress Notes (Signed)
Progress Note  Patient Name: Paula Klein Date of Encounter: 03/02/2020  Primary Cardiologist: Agbor-Etang  Subjective   Feeling much better. No chest pain, SOB, palpitations, dizziness, presyncope, or syncope. No abdominal pain. She remains in Afib with improving ventricular rates in the 90s to low 100s bpm. No plans for surgical or IR intervention at this time. Documented UOP 3.2 L for the past 24 hours with a net - 5.5 L for the admission. Weight 70.8-->69.9 kg. Renal function continues to improve. Potassium 3.2.  Inpatient Medications    Scheduled Meds: . feeding supplement  237 mL Oral BID BM  . fluticasone furoate-vilanterol  1 puff Inhalation Daily  . furosemide  20 mg Intravenous Q12H  . metoprolol tartrate  100 mg Oral BID  . sodium chloride flush  3 mL Intravenous Q12H   Continuous Infusions: . sodium chloride 250 mL (03/02/20 0640)  . heparin 1,150 Units/hr (03/01/20 2246)  . piperacillin-tazobactam (ZOSYN)  IV 3.375 g (03/02/20 0642)   PRN Meds: sodium chloride, acetaminophen, albuterol, HYDROmorphone (DILAUDID) injection, ondansetron (ZOFRAN) IV, sodium chloride flush   Vital Signs    Vitals:   03/01/20 1751 03/01/20 1947 03/01/20 2321 03/02/20 0425  BP: 113/79 124/88 121/88 122/84  Pulse: (!) 104 (!) 103 (!) 107 100  Resp: 20 17  18   Temp: 97.6 F (36.4 C) 98.3 F (36.8 C)  97.6 F (36.4 C)  TempSrc: Oral Oral  Oral  SpO2:  100%  100%  Weight:    69.9 kg  Height:        Intake/Output Summary (Last 24 hours) at 03/02/2020 0737 Last data filed at 03/02/2020 0655 Gross per 24 hour  Intake 222.54 ml  Output 3475 ml  Net -3252.46 ml   Filed Weights   02/29/20 0355 03/01/20 0532 03/02/20 0425  Weight: 75.8 kg 70.8 kg 69.9 kg    Telemetry    Afib,low 100s bpm mostly with brief episode of RVR into the 150s bpm, occasional PVCS, rare ventricular triplet - Personally Reviewed  ECG    No new tracings - Personally Reviewed  Physical Exam    GEN: No acute distress.   Neck: No JVD. Cardiac: IRIR, II/VI systolic murmur, no rubs, or gallops.  Respiratory:  Faintly diminished along the bilateral bases.  GI: Soft, nontender, non-distended.   MS: Trace pretibial edema; No deformity. Neuro:  Alert and oriented x3; Nonfocal.  Psych: Normal affect, responds to questions appropriately.  Labs    Chemistry Recent Labs  Lab 02/29/20 0617 03/01/20 0432 03/02/20 0649  NA 130* 136 134*  K 4.2 3.5 3.2*  CL 92* 96* 92*  CO2 27 30 33*  GLUCOSE 107* 104* 106*  BUN 38* 27* 20  CREATININE 1.24* 0.79 0.67  CALCIUM 9.2 9.1 8.9  PROT 6.2* 6.0* 6.0*  ALBUMIN 3.0* 2.9* 2.8*  AST 480* 279* 174*  ALT 534* 412* 303*  ALKPHOS 93 94 91  BILITOT 2.0* 2.0* 2.1*  GFRNONAA 46* >60 >60  ANIONGAP 11 10 9      Hematology Recent Labs  Lab 02/29/20 0617 03/01/20 0432 03/02/20 0649  WBC 16.1* 15.6* 13.5*  RBC 4.45 4.68 4.78  HGB 10.7* 11.4* 11.5*  HCT 34.9* 36.7 38.1  MCV 78.4* 78.4* 79.7*  MCH 24.0* 24.4* 24.1*  MCHC 30.7 31.1 30.2  RDW 16.2* 16.7* 16.4*  PLT 611* 661* 646*    Cardiac EnzymesNo results for input(s): TROPONINI in the last 168 hours. No results for input(s): TROPIPOC in the last 168 hours.  BNP Recent Labs  Lab 02/26/20 1234  BNP 1,132.2*     DDimer No results for input(s): DDIMER in the last 168 hours.   Radiology    DG Chest Portable 1 View  Result Date: 02/26/2020 IMPRESSION: Small bilateral pleural effusions, left greater than right. Associated bibasilar opacities, may represent atelectasis and/or pneumonia. Electronically Signed   By: Duanne Guess D.O.   On: 02/26/2020 12:59   US Abdomen Limited RUQ (LIVER/GB)  Result Date: 02/26/2020 IMPRESSION: 1. Multiple gallstones and positive sonographic Murphy's sign. Evaluate for acute cholecystitis. 2. Normal common bile duct. 3. RIGHT effusion and trace ascites. Electronically Signed   By: Genevive Bi M.D.   On: 02/26/2020 13:19    Cardiac  Studies   2D echo 02/22/2020: 1. Left ventricular ejection fraction, by estimation, is 20 to 25%. The  left ventricle has severely decreased function. The left ventricle has no  regional wall motion abnormalities. Left ventricular diastolic parameters  are indeterminate.  2. Right ventricular systolic function is mildly reduced. The right  ventricular size is mildly enlarged. There is mildly elevated pulmonary  artery systolic pressure. The estimated right ventricular systolic  pressure is 34.9 mmHg.  3. Left atrial size was moderately dilated.  4. Right atrial size was moderately dilated.  5. A small pericardial effusion is present. The pericardial effusion is  posterior to the left ventricle. There is no evidence of cardiac  tamponade. Moderate pleural effusion in the left lateral region.  6. The mitral valve is normal in structure. Moderate mitral valve  regurgitation. No evidence of mitral stenosis.  7. The aortic valve is normal in structure. Aortic valve regurgitation is  not visualized. No aortic stenosis is present.  8. The inferior vena cava is dilated in size with <50% respiratory  variability, suggesting right atrial pressure of 15 mmHg.  Patient Profile     74 y.o. female with history of HFrEF, Afib, moderate mitral regurgitation, recent Covid infection in 12/2019, DVT, and COPD with prior tobacco use admitted with acute cholecystitis who we are seeing for HFrEF and Afib.   Assessment & Plan    1. HFrEF: -Volume status is improved, though she does remain mildly volume up -Ideally, we would like to diurese her one more day, though she really would like to be discharged today -Continue IV Lasix 20 mg bid for today, Dr. Mariah Milling will provide recommendations on possible discharge today on 12/31 -Continue to avoid diltiazem given her cardiomyopathy -For now, she remains on Lopressor for added ventricular rate control in the setting of her Afib and acute illness. As these  improve, either prior to discharge or as an outpatient, consider consolidating to Toprol or changing to Coreg given her cardiomyopathy -Currently, we need available BP room for added ventricular rate control, as this improves, look to escalate GDMT further with addition of ACEi/ARB/ARNI/MRA as able, AKI has also precluded the addition of these medications during this admission -Unfortunately, with the New Year's holiday, she cannot undergo R/LHC on 12/31 as the cath lab ans specials are closed, this will need to be done as an outpatient -Dr. Mariah Milling will review her medications to make recommendations regarding diuretic at discharge  -Strict I's and O's -Daily weights  2. Persistent Afib with RVR: -Uncertain duration of Afib, left atrium 46 mm by echo 02/24/2020 -She remains in Afib with RVR with ventricular rates in the low 100s bpm with rare episode of RVR into the 150s bpm -Continue metoprolol tartrate to 100 mg twice daily -  Diltiazem is not a great option given her cardiomyopathy -Given we are unable to proceed with [facility] at this time, in an effort to prevent her from having to remain admitted until 03/06/2020, we will go ahead and resume her Xarelto and stop heparin gt. She prefers Xarelto over Eliquis as she was on this at home and could not afford Eliquis -CHA2DS2-VASc 4  -Once she has improved from her acute illness, if she remains in A. fib, once she has been adequately anticoagulated without interruption for 3 to 4 weeks consider outpatient DCCV, though duration of Afib remains uncertain and her left atrium is mildly dilated, indicating she may need AAT  3. Preoperative cardiac risk stratification: -Not felt to be a good surgical candidate for cholecystectomy given her multiple comorbid conditions, from a cardiac perspective, this can be reassessed at a later date once her condition improves -Acceptable risk for percutaneous cholecystostomy as indicated per surgical team, though this has been  deferred as outlined below  4. AKI: -Resolved  -Estimated Creatinine Clearance: 55.1 mL/min (by C-G formula based on SCr of 0.67 mg/dL).  5. Cholelithiasis: -Improving -Not a surgical candidate at this time as previously noted -With improvement in symptoms and labs without definitive radiographic evidence of cholecystitis and no safe percutaneous window there are no current plans for surgical or IR intervention -Follow up with PCP and surgery as directed    6. Hypokalemia: -Replete to goal 4.0   For questions or updates, please contact [place] Please consult [email or web address] for contact info under Cardiology/STEMI.    Signed, Christell Faith, PA-C [place] Pager: [phone] 03/02/2020, 7:37 AM

## 2020-03-02 NOTE — Progress Notes (Signed)
03/02/2020  Subjective: No acute events.  Patient denies any pain and is tolerating diet.  Her AST/ALT continue to improve, and her total bili is stable.  Her WBC improved today to 13.5.  Fully oriented this morning.  Vital signs: Temp:  [97.6 F (36.4 C)-98.3 F (36.8 C)] 97.7 F (36.5 C) (12/30 0755) Pulse Rate:  [100-111] 111 (12/30 0755) Resp:  [16-20] 16 (12/30 0755) BP: (113-124)/(79-97) 118/93 (12/30 0755) SpO2:  [99 %-100 %] 99 % (12/30 0755) Weight:  [69.9 kg] 69.9 kg (12/30 0425)   Intake/Output: 12/29 0701 - 12/30 0700 In: 222.5 [I.V.:104.7; IV Piggyback:117.9] Out: 3475 [Urine:3475] Last BM Date: 02/27/20  Physical Exam: Constitutional:  No acute distress Abdomen:  Soft, non-distended, non-tender to palpation.  Labs:  Recent Labs    03/01/20 0432 03/02/20 0649  WBC 15.6* 13.5*  HGB 11.4* 11.5*  HCT 36.7 38.1  PLT 661* 646*   Recent Labs    03/01/20 0432 03/02/20 0649  NA 136 134*  K 3.5 3.2*  CL 96* 92*  CO2 30 33*  GLUCOSE 104* 106*  BUN 27* 20  CREATININE 0.79 0.67  CALCIUM 9.1 8.9   Recent Labs    02/29/20 0617 03/01/20 0432  LABPROT 22.7* 17.4*  INR 2.1* 1.5*    Imaging: No results found.  Assessment/Plan: This is a 74 y.o. female with acute cholecystitis in the setting of CHF and shortness of breath, recent COVID infection.  --Her abdominal exam is normal today without any pain.  She's tolerating a diet, and her WBC continues to improve.  No need for percutaneous drainage unless her clinical picture deteriorates again. --Recommend completing 2 week course of antibiotics, and can transition to Augmentin on discharge if needed. --Will sign off for now, but please feel free to consult or call if any issues. --Follow up with Dr. Lovell Sheehan at [facility] as outpatient.  He has seen her in the past for biliary colic and was awaiting cardiac/pulmonary optimization.   Howie Ill, MD [facility]

## 2020-03-02 NOTE — Consult Note (Addendum)
   Heart Failure Nurse Navigator Note  HFrEF 20- 25%.  No regional wall motion abnormalities.  Ventricular diastolic parameters are indeterminate.  Right ventricular systolic function is mildly reduced.  Right ventricular size is mildly enlarged.  LV elevated pulmonary artery systolic pressures.  Left atria is moderately dilated.  Right atria is moderately dilated.  Moderate pleural effusion.  She presented to the emergency room with complaints of shortness of breath, palpitations, abdominal pain and dysphagia.  Noted a 10 pound weight gain in the last 2 months and increased leg edema.  X-ray revealed bilateral lower lobe infiltrates and small pleural effusion.   Comorbidities:  Hypertension   Also has a history of being treated for Covid pneumonia 2 months ago.  Medications:  Furosemide 20 mg IV every 12 hours Toprol tartrate 100 mg twice a day  Cardiology plans to place her on the 4 pillars of guideline directed medical therapy which includes beta-blocker, Arni/ARB/ACE and MRA  Labs:  Sodium 134, potassium 3.2, chloride 92, CO2 33, BUN 20, creatinine 0.67, hemoglobin 11.5, hematocrit 38.1. Intake 222 mL output 3475 mL To 69.9 from 70.8 BMI 29.14 BP is 118/93   Assessment:   General-she is awake and alert sitting up in bed.  In no acute distress.  HEENT-pupils are equal.  No JVD.  Cardiac-heart tones are irregular.  Chest- breath sounds are diminished in the bases bilaterally to posterior auscultation.  Abdomen-rounded soft nontender.   Musculoskeletal-1+ pitting edema lower extremities.   Psych-is pleasant and appropriate makes good eye contact.  Neurologic-speech is clear moves all extremities without difficulty.    This was initial visit with patient.  Discussed what heart failure is, but the function of her heart is at 20 to 25%.  She voices understanding.  Discussed the importance of low-sodium diet, not adding salt at the table and also restricting  her fluid intake to no more than eight 8 ounce cups fluid, that includes coffee water tea juices etc.  Also talked about the importance of daily weights, after she has emptied her bladder and then records.  Re forced to report a 2 pound weight gain overnight or 5 pounds within a week.  She voices understanding.  Made her aware that we do have heart failure videos, she states that this time she did not want to view them.  Told her that if she decided later that she like to view them she can even ask her nurse to see them.  She is scheduled for right and left heart catheterization to be performed tomorrow.  We will continue to follow.  Tresa Endo RN [facility]

## 2020-03-02 NOTE — TOC Progression Note (Signed)
Transition of Care [facility]) - Progression Note    Patient Details  Name: Paula Klein MRN: [id number] Date of Birth: 1945/05/09  [facility]) CM/SW Contact  Maree Krabbe, LCSW Phone Number: 03/02/2020, 10:45 AM  Clinical Narrative:   PT recommending HH. Pt is agreeable and did not have preference for agency. CSW explained the search process for a agency that will take pt's insurance--pt agreeable.   [facility] will service pt.    Expected Discharge Plan: Home/Self Care (OT no f/u PT pdg.) Barriers to Discharge: Continued Medical Work up  Expected Discharge Plan and Services Expected Discharge Plan: Home/Self Care (OT no f/u PT pdg.)       Living arrangements for the past 2 months: Mobile Home                           HH Arranged: PT,OT HH Agency: [facility] Date [facility] Agency Contacted: 03/02/20 Time HH Agency Contacted: 1045 Representative spoke with at [facility] Agency: brittany   Social Determinants of Health (SDOH) Interventions    Readmission Risk Interventions Readmission Risk Prevention Plan 02/29/2020  Transportation Screening Complete  PCP or Specialist Appt within 5-7 Days Complete  Home Care Screening Complete  Medication Review (RN CM) Complete  Some recent data might be hidden

## 2020-03-04 LAB — CULTURE, BLOOD (ROUTINE X 2)
Culture: NO GROWTH
Culture: NO GROWTH
Special Requests: ADEQUATE
Special Requests: ADEQUATE

## 2020-03-07 ENCOUNTER — Ambulatory Visit: Payer: Medicare HMO | Admitting: Cardiology

## 2020-03-09 ENCOUNTER — Telehealth: Payer: Self-pay | Admitting: Cardiology

## 2020-03-09 NOTE — Telephone Encounter (Signed)
   Pt c/o medication issue:  1. Name of Medication: xarelto 10 mg po q d   2. How are you currently taking this medication (dosage and times per day)?  10 mg po q d   3. Are you having a reaction (difficulty breathing--STAT)?  no  4. What is your medication issue? Per hh nurse kendra patient dc instructions says take 20 mg and bottle is for 10 mg   Please call to clarify dose

## 2020-03-09 NOTE — Telephone Encounter (Signed)
Spoke with Home Health Nurse and clarified patients dose of Xarelto should be 10 MG, 2 pills once a day. She reported that the patient is scared of taking more than 1 pill a day, and is leery of the medical community prescribing anything more as she fears she will bleed to death.  I will call patient to discuss with her further the risks vs benefits.

## 2020-03-09 NOTE — Telephone Encounter (Signed)
Spoke with the patient and explained that during her hospital stay her telemetry showed atrial fibrillation with rapid ventricular response, and that her Xarelto increase is protecting her from the high risk of developing a clot and decreasing her risk for a stroke. Patient verbalized understanding and agreed to take the higher dose. She confirmed that she will be here for her f/u next week on 03/16/20.

## 2020-03-16 ENCOUNTER — Encounter: Payer: Self-pay | Admitting: Cardiology

## 2020-03-16 ENCOUNTER — Other Ambulatory Visit: Payer: Self-pay

## 2020-03-16 ENCOUNTER — Ambulatory Visit: Payer: Medicare HMO | Admitting: Cardiology

## 2020-03-16 VITALS — BP 112/60 | HR 69 | Ht 61.0 in | Wt 140.1 lb

## 2020-03-16 DIAGNOSIS — I5020 Unspecified systolic (congestive) heart failure: Secondary | ICD-10-CM | POA: Diagnosis not present

## 2020-03-16 DIAGNOSIS — I4891 Unspecified atrial fibrillation: Secondary | ICD-10-CM | POA: Diagnosis not present

## 2020-03-16 MED ORDER — RIVAROXABAN 10 MG PO TABS: 10.0000 mg | ORAL_TABLET | Freq: Two times a day (BID) | ORAL | 5 refills | Status: DC

## 2020-03-16 MED ORDER — ENTRESTO 24-26 MG PO TABS: 1.0000 | ORAL_TABLET | Freq: Two times a day (BID) | ORAL | 4 refills | Status: DC

## 2020-03-16 MED ORDER — METOPROLOL SUCCINATE ER 100 MG PO TB24: 100.0000 mg | ORAL_TABLET | Freq: Every day | ORAL | 1 refills | Status: DC

## 2020-03-16 NOTE — Progress Notes (Deleted)
   Patient ID: WYLEE OGDEN, female    DOB: 1945/09/28, 75 y.o.   MRN: [id number]  HPI  Ms Bhullar is a 75 y/o female with a history of  Echo report from 02/22/20 reviewed and showed an EF of 20-25% along with mildly elevated PA pressure (34.9 mm Hg), moderate LAE and moderate MR.   Admitted 02/26/20 due to shortness of breath and abdominal pain. Cardiology, GI and surgical consults obtained. Initially needed IV lasix and then transitioned to oral diuretics. Found to have cholelithiasis w/ cholecystitis and was treated medically w/ IV abxs and will transition to po augmentin at d/c. Discharged after 5 days.   She presents for her initial visit with a chief complaint of  Review of Systems    Physical Exam    Assessment & Plan:  1: Chronic heart failure with reduced ejection fraction- - NYHA class - saw cardiology (Agbor-Etang) 03/16/20 - BNP 02/26/20 was 1132.2  2: HTN- - BP - sees PCP Lennox Grumbles) at [place] 03/02/20 reviewed and showed sodium 134, potassium 3.2, creatinine 0.67 and GFR >60   3: PAF with DVT's-

## 2020-03-16 NOTE — H&P (View-Only) (Signed)
Cardiology Office Note:    Date:  03/16/2020   ID:  Paula Klein, DOB November 23, 1945, MRN [id number]  PCP:  Marguerita Merles, MD  [facility] Cardiologist:  Kate Sable, MD  [place] Electrophysiologist:  None   Referring MD: Marguerita Merles, MD   Chief Complaint  Patient presents with   2 Week Follow-up    Patient was at [facility]; CHF & A-Fib. Medications reviewed by the patient's medication list. "doing well."     History of Present Illness:    Paula Klein is a 75 y.o. female with a hx of HFrEF, EF 25%, persistent A. fib, hypertension, DVT on Xarelto, COPD, former smoker who presents for follow-up.  Patient initially seen for shortness of breath.  Started on torsemide, but recently admitted with CHF symptoms.  Managed with IV diuretics, with significant improvement.  Discharged on Lopressor, losartan, torsemide.  She feels well, her weight has been well maintained.  Shortness of breath is significantly improved.  Denies palpitations or chest pain.  Prior notes Echocardiogram 02/2020 severely reduced ejection fraction, EF 20 to 25%.  Moderately dilated LA, moderately dilated RA.  Moderate MR.  Past Medical History:  Diagnosis Date   H/O blood clots    Heart murmur    Hypertension     Past Surgical History:  Procedure Laterality Date   COLONOSCOPY WITH PROPOFOL N/A 09/26/2015   Procedure: COLONOSCOPY WITH PROPOFOL;  Surgeon: Lollie Sails, MD;  Location: [facility] ENDOSCOPY;  Service: Endoscopy;  Laterality: N/A;   NO PAST SURGERIES      Current Medications: Current Meds  Medication Sig   albuterol (PROVENTIL HFA;VENTOLIN HFA) 108 (90 Base) MCG/ACT inhaler Inhale 2 puffs into the lungs every 6 (six) hours as needed for shortness of breath.   Ascorbic Acid (VITAMIN C) 1000 MG tablet Take 1,000 mg by mouth daily.    calcium carbonate (OS-CAL - DOSED IN MG OF ELEMENTAL CALCIUM) 1250 (500 Ca) MG tablet Take 1 tablet by mouth daily.   Cholecalciferol (VITAMIN  D3 PO) Take 500 Units by mouth daily.   clonazePAM (KLONOPIN) 0.5 MG tablet Take 0.5 mg by mouth daily as needed for anxiety.   digoxin (LANOXIN) 0.125 MG tablet Take 1 tablet (0.125 mg total) by mouth daily.   estradiol (ESTRACE) 0.5 MG tablet Take 0.5 mg by mouth daily.   fluticasone furoate-vilanterol (BREO ELLIPTA) 100-25 MCG/INH AEPB Inhale 1 puff into the lungs daily.   magnesium oxide (MAG-OX) 400 MG tablet Take 400 mg by mouth daily.   medroxyPROGESTERone (PROVERA) 2.5 MG tablet Take 2.5 mg by mouth daily.   metoprolol succinate (TOPROL-XL) 100 MG 24 hr tablet Take 1 tablet (100 mg total) by mouth daily. Take with or immediately following a meal.   quiNINE (QUALAQUIN) 324 MG capsule Take 648 mg by mouth 3 (three) times daily as needed.   sacubitril-valsartan (ENTRESTO) 24-26 MG Take 1 tablet by mouth 2 (two) times daily.   torsemide (DEMADEX) 20 MG tablet Take 1 tablet (20 mg total) by mouth 2 (two) times daily.   [DISCONTINUED] aspirin EC 81 MG tablet Take 81 mg by mouth daily. Swallow whole.   [DISCONTINUED] losartan (COZAAR) 25 MG tablet Take 0.5 tablets (12.5 mg total) by mouth daily.   [DISCONTINUED] metoprolol tartrate (LOPRESSOR) 100 MG tablet Take 1 tablet (100 mg total) by mouth 2 (two) times daily.   [DISCONTINUED] rivaroxaban (XARELTO) 10 MG TABS tablet Take 10 mg by mouth in the morning and at bedtime.   [DISCONTINUED]  XARELTO 10 MG TABS tablet Take 2 tablets (20 mg total) by mouth daily.     Allergies:   Pine tar, Sulfa antibiotics, and Tetracyclines & related   Social History   Socioeconomic History   Marital status: Widowed    Spouse name: Not on file   Number of children: Not on file   Years of education: Not on file   Highest education level: Not on file  Occupational History   Not on file  Tobacco Use   Smoking status: Former Smoker    Packs/day: 3.00    Years: 30.00    Pack years: 90.00    Quit date: 02/03/1989    Years since  quitting: 31.1   Smokeless tobacco: Never Used  Vaping Use   Vaping Use: Never used  Substance and Sexual Activity   Alcohol use: Yes   Drug use: No   Sexual activity: Not on file  Other Topics Concern   Not on file  Social History Narrative   Not on file   Social Determinants of Health   Financial Resource Strain: Not on file  Food Insecurity: Not on file  Transportation Needs: Not on file  Physical Activity: Not on file  Stress: Not on file  Social Connections: Not on file     Family History: The patient's family history includes AAA (abdominal aortic aneurysm) in her mother; Cancer in her brother; Heart attack in her father; Varicose Veins in her mother.  ROS:   Please see the history of present illness.     All other systems reviewed and are negative.  EKGs/Labs/Other Studies Reviewed:    The following studies were reviewed today:   EKG:  EKG is  ordered today.  The ekg ordered today demonstrates atrial fibrillation, heart rate 69  Recent Labs: 02/26/2020: B Natriuretic Peptide 1,132.2 03/02/2020: ALT 303; BUN 20; Creatinine, Ser 0.67; Hemoglobin 11.5; Magnesium 1.8; Platelets 646; Potassium 3.2; Sodium 134  Recent Lipid Panel No results found for: CHOL, TRIG, HDL, CHOLHDL, VLDL, LDLCALC, LDLDIRECT   Risk Assessment/Calculations:      Physical Exam:    VS:  BP 112/60 (BP Location: Left Arm, Patient Position: Sitting, Cuff Size: Normal)    Pulse 69    Ht 5\' 1"  (1.549 m)    Wt 140 lb 2 oz (63.6 kg)    SpO2 96%    BMI 26.48 kg/m     Wt Readings from Last 3 Encounters:  03/16/20 140 lb 2 oz (63.6 kg)  03/02/20 154 lb 3.2 oz (69.9 kg)  02/22/20 158 lb (71.7 kg)     GEN:  Well nourished, well developed in no acute distress HEENT: Normal NECK: No JVD; No carotid bruits LYMPHATICS: No lymphadenopathy CARDIAC: Distant heart sounds, irregular irregular RESPIRATORY: Decreased breath sounds at bases ABDOMEN: Soft, non-tender, distended MUSCULOSKELETAL:  Trace edema; No deformity  SKIN: Warm and dry NEUROLOGIC:  Alert and oriented x 3 PSYCHIATRIC:  Normal affect   ASSESSMENT:    1. HFrEF (heart failure with reduced ejection fraction) ([place])   2. Atrial fibrillation, unspecified type ([place])    PLAN:    In order of problems listed above:  1. HFrEF, EF 20 to 25%.  Appears euvolemic, stop Lopressor, stop losartan.  Start Toprol-XL 100 mg daily, start Entresto 24/26 mg twice daily.  Schedule patient for right and left heart cath.  Plan for referral to advanced heart failure clinic after heart cath. 2. Persistent atrial fibrillation, rapid ventricular response.  CHA2DS2-VASc of 4(chf, htn, gender,  age).  Heart rate controlled, stop Lopressor, start Toprol-XL, Xarelto 20 mg daily, digoxin for now.  If heart rate controlled at follow-up visit, stop digoxin.  Patient is planning a dental procedure in the next several days, recommend holding Xarelto for 48 hours prior to procedure if needed.  Restart as soon as possible after.   Follow-up after right and left heart cath.   Shared Decision Making/Informed Consent The risks [stroke (1 in 1000), death (1 in 1000), kidney failure [usually temporary] (1 in 500), bleeding (1 in 200), allergic reaction [possibly serious] (1 in 200)], benefits (diagnostic support and management of coronary artery disease) and alternatives of a cardiac catheterization were discussed in detail with Ms. Harkless and she is willing to proceed.  Medication Adjustments/Labs and Tests Ordered: Current medicines are reviewed at length with the patient today.  Concerns regarding medicines are outlined above.  Orders Placed This Encounter  Procedures   Basic metabolic panel   EKG 14-NWGN   Meds ordered this encounter  Medications   metoprolol succinate (TOPROL-XL) 100 MG 24 hr tablet    Sig: Take 1 tablet (100 mg total) by mouth daily. Take with or immediately following a meal.    Dispense:  90 tablet    Refill:  1    sacubitril-valsartan (ENTRESTO) 24-26 MG    Sig: Take 1 tablet by mouth 2 (two) times daily.    Dispense:  60 tablet    Refill:  4    Patient Instructions  Medication Instructions:   Your physician has recommended you make the following change in your medication:   1.  STOP taking your Aspirin. 2.  STOP taking your Lopressor. 3.  STOP taking your Losartan. 4.  START taking Toprol XL 100 MG: Take one tab by mouth once a day. 5.  START taking Entresto 24-26 MG: take one tab by mouth twice a day.  *If you need a refill on your cardiac medications before your next appointment, please call your pharmacy*   Lab Work: BMP to be drawn today.  If you have labs (blood work) drawn today and your tests are completely normal, you will receive your results only by:  [place] (if you have MyChart) OR  A paper copy in the mail If you have any lab test that is abnormal or we need to change your treatment, we will call you to review the results.   Testing/Procedures:  You are scheduled for a Left Heart Cath on _____Friday Jan _21_________ with Dr.___Arida________ Please arrive at the [place] of [facility] at _____0630____ a.m. on the day of your procedure.  DIET INSTRUCTIONS:  Nothing to eat or drink after midnight except your medications with a              sip of water.         Medications:  HOLD your rivaroxaban (XARELTO) 10 MG TABS tablet for 2 days prior to your procedure. HOLD your Delene Loll the day of your procedure. HOLD your torsemide (DEMADEX) 20 MG tablet day of your procedure.   1) Must have a responsible person to drive you home.  2) Bring a current list of your medications and current insurance cards.    If you have any questions after you get home, please call the office at [phone]    Follow-Up: At [facility], you and your health needs are our priority.  As part of our continuing mission to provide you with exceptional heart care, we have created  designated  Provider Care Teams.  These Care Teams include your primary Cardiologist (physician) and Advanced Practice Providers (APPs -  Physician Assistants and Nurse Practitioners) who all work together to provide you with the care you need, when you need it.  We recommend signing up for the patient portal called "MyChart".  Sign up information is provided on this After Visit Summary.  MyChart is used to connect with patients for Virtual Visits (Telemedicine).  Patients are able to view lab/test results, encounter notes, upcoming appointments, etc.  Non-urgent messages can be sent to your provider as well.   To learn more about what you can do with MyChart, go to [email or web address].    Your next appointment:   2-3 weeks   The format for your next appointment:   In Person  Provider:   Kate Sable, MD   Other Instructions      Signed, Kate Sable, MD  03/16/2020 1:32 PM    [place]

## 2020-03-16 NOTE — Patient Instructions (Addendum)
Medication Instructions:   Your physician has recommended you make the following change in your medication:   1.  STOP taking your Aspirin. 2.  STOP taking your Lopressor. 3.  STOP taking your Losartan. 4.  START taking Toprol XL 100 MG: Take one tab by mouth once a day. 5.  START taking Entresto 24-26 MG: take one tab by mouth twice a day.  *If you need a refill on your cardiac medications before your next appointment, please call your pharmacy*   Lab Work: BMP to be drawn today.  If you have labs (blood work) drawn today and your tests are completely normal, you will receive your results only by: Marland Kitchen MyChart Message (if you have MyChart) OR . A paper copy in the mail If you have any lab test that is abnormal or we need to change your treatment, we will call you to review the results.   Testing/Procedures:  You are scheduled for a Left Heart Cath on _____Friday Jan _21_________ with Dr.___Arida________ Please arrive at the [place] of [facility] at _____0630____ a.m. on the day of your procedure.  DIET INSTRUCTIONS:  Nothing to eat or drink after midnight except your medications with a              sip of water.         Medications:  HOLD your rivaroxaban (XARELTO) 10 MG TABS tablet for 2 days prior to your procedure. HOLD your Delene Loll the day of your procedure. HOLD your torsemide (DEMADEX) 20 MG tablet day of your procedure.   1) Must have a responsible person to drive you home.  2) Bring a current list of your medications and current insurance cards.    If you have any questions after you get home, please call the office at [phone]    Follow-Up: At [facility], you and your health needs are our priority.  As part of our continuing mission to provide you with exceptional heart care, we have created designated Provider Care Teams.  These Care Teams include your primary Cardiologist (physician) and Advanced Practice Providers (APPs -  Physician Assistants and Nurse  Practitioners) who all work together to provide you with the care you need, when you need it.  We recommend signing up for the patient portal called "MyChart".  Sign up information is provided on this After Visit Summary.  MyChart is used to connect with patients for Virtual Visits (Telemedicine).  Patients are able to view lab/test results, encounter notes, upcoming appointments, etc.  Non-urgent messages can be sent to your provider as well.   To learn more about what you can do with MyChart, go to [email or web address].    Your next appointment:   2-3 weeks   The format for your next appointment:   In Person  Provider:   Kate Sable, MD   Other Instructions

## 2020-03-16 NOTE — Progress Notes (Signed)
Cardiology Office Note:    Date:  03/16/2020   ID:  Paula Klein, DOB [birth year], MRN [id number]  PCP:  Marguerita Merles, MD  [facility] Cardiologist:  Kate Sable, MD  [place] Electrophysiologist:  None   Referring MD: Marguerita Merles, MD   Chief Complaint  Patient presents with  . 2 Week Follow-up    Patient was at [facility]; CHF & A-Fib. Medications reviewed by the patient's medication list. "doing well."     History of Present Illness:    Paula Klein is a 75 y.o. female with a hx of HFrEF, EF 25%, persistent A. fib, hypertension, DVT on Xarelto, COPD, former smoker who presents for follow-up.  Patient initially seen for shortness of breath.  Started on torsemide, but recently admitted with CHF symptoms.  Managed with IV diuretics, with significant improvement.  Discharged on Lopressor, losartan, torsemide.  She feels well, her weight has been well maintained.  Shortness of breath is significantly improved.  Denies palpitations or chest pain.  Prior notes Echocardiogram 02/2020 severely reduced ejection fraction, EF 20 to 25%.  Moderately dilated LA, moderately dilated RA.  Moderate MR.  Past Medical History:  Diagnosis Date  . H/O blood clots   . Heart murmur   . Hypertension     Past Surgical History:  Procedure Laterality Date  . COLONOSCOPY WITH PROPOFOL N/A 09/26/2015   Procedure: COLONOSCOPY WITH PROPOFOL;  Surgeon: Lollie Sails, MD;  Location: [facility] ENDOSCOPY;  Service: Endoscopy;  Laterality: N/A;  . NO PAST SURGERIES      Current Medications: Current Meds  Medication Sig  . albuterol (PROVENTIL HFA;VENTOLIN HFA) 108 (90 Base) MCG/ACT inhaler Inhale 2 puffs into the lungs every 6 (six) hours as needed for shortness of breath.  . Ascorbic Acid (VITAMIN C) 1000 MG tablet Take 1,000 mg by mouth daily.   . calcium carbonate (OS-CAL - DOSED IN MG OF ELEMENTAL CALCIUM) 1250 (500 Ca) MG tablet Take 1 tablet by mouth daily.  . Cholecalciferol (VITAMIN  D3 PO) Take 500 Units by mouth daily.  . clonazePAM (KLONOPIN) 0.5 MG tablet Take 0.5 mg by mouth daily as needed for anxiety.  . digoxin (LANOXIN) 0.125 MG tablet Take 1 tablet (0.125 mg total) by mouth daily.  Marland Kitchen estradiol (ESTRACE) 0.5 MG tablet Take 0.5 mg by mouth daily.  . fluticasone furoate-vilanterol (BREO ELLIPTA) 100-25 MCG/INH AEPB Inhale 1 puff into the lungs daily.  . magnesium oxide (MAG-OX) 400 MG tablet Take 400 mg by mouth daily.  . medroxyPROGESTERone (PROVERA) 2.5 MG tablet Take 2.5 mg by mouth daily.  . metoprolol succinate (TOPROL-XL) 100 MG 24 hr tablet Take 1 tablet (100 mg total) by mouth daily. Take with or immediately following a meal.  . quiNINE (QUALAQUIN) 324 MG capsule Take 648 mg by mouth 3 (three) times daily as needed.  . sacubitril-valsartan (ENTRESTO) 24-26 MG Take 1 tablet by mouth 2 (two) times daily.  Marland Kitchen torsemide (DEMADEX) 20 MG tablet Take 1 tablet (20 mg total) by mouth 2 (two) times daily.  . [DISCONTINUED] aspirin EC 81 MG tablet Take 81 mg by mouth daily. Swallow whole.  . [DISCONTINUED] losartan (COZAAR) 25 MG tablet Take 0.5 tablets (12.5 mg total) by mouth daily.  . [DISCONTINUED] metoprolol tartrate (LOPRESSOR) 100 MG tablet Take 1 tablet (100 mg total) by mouth 2 (two) times daily.  . [DISCONTINUED] rivaroxaban (XARELTO) 10 MG TABS tablet Take 10 mg by mouth in the morning and at bedtime.  . [DISCONTINUED]  XARELTO 10 MG TABS tablet Take 2 tablets (20 mg total) by mouth daily.     Allergies:   Pine tar, Sulfa antibiotics, and Tetracyclines & related   Social History   Socioeconomic History  . Marital status: Widowed    Spouse name: Not on file  . Number of children: Not on file  . Years of education: Not on file  . Highest education level: Not on file  Occupational History  . Not on file  Tobacco Use  . Smoking status: Former Smoker    Packs/day: 3.00    Years: 30.00    Pack years: 90.00    Quit date: 02/03/1989    Years since  quitting: 31.1  . Smokeless tobacco: Never Used  Vaping Use  . Vaping Use: Never used  Substance and Sexual Activity  . Alcohol use: Yes  . Drug use: No  . Sexual activity: Not on file  Other Topics Concern  . Not on file  Social History Narrative  . Not on file   Social Determinants of Health   Financial Resource Strain: Not on file  Food Insecurity: Not on file  Transportation Needs: Not on file  Physical Activity: Not on file  Stress: Not on file  Social Connections: Not on file     Family History: The patient's family history includes AAA (abdominal aortic aneurysm) in her mother; Cancer in her brother; Heart attack in her father; Varicose Veins in her mother.  ROS:   Please see the history of present illness.     All other systems reviewed and are negative.  EKGs/Labs/Other Studies Reviewed:    The following studies were reviewed today:   EKG:  EKG is  ordered today.  The ekg ordered today demonstrates atrial fibrillation, heart rate 69  Recent Labs: 02/26/2020: B Natriuretic Peptide 1,132.2 03/02/2020: ALT 303; BUN 20; Creatinine, Ser 0.67; Hemoglobin 11.5; Magnesium 1.8; Platelets 646; Potassium 3.2; Sodium 134  Recent Lipid Panel No results found for: CHOL, TRIG, HDL, CHOLHDL, VLDL, LDLCALC, LDLDIRECT   Risk Assessment/Calculations:      Physical Exam:    VS:  BP 112/60 (BP Location: Left Arm, Patient Position: Sitting, Cuff Size: Normal)   Pulse 69   Ht 5\' 1"  (1.549 m)   Wt 140 lb 2 oz (63.6 kg)   SpO2 96%   BMI 26.48 kg/m     Wt Readings from Last 3 Encounters:  03/16/20 140 lb 2 oz (63.6 kg)  03/02/20 154 lb 3.2 oz (69.9 kg)  02/22/20 158 lb (71.7 kg)     GEN:  Well nourished, well developed in no acute distress HEENT: Normal NECK: No JVD; No carotid bruits LYMPHATICS: No lymphadenopathy CARDIAC: Distant heart sounds, irregular irregular RESPIRATORY: Decreased breath sounds at bases ABDOMEN: Soft, non-tender, distended MUSCULOSKELETAL:  Trace edema; No deformity  SKIN: Warm and dry NEUROLOGIC:  Alert and oriented x 3 PSYCHIATRIC:  Normal affect   ASSESSMENT:    1. HFrEF (heart failure with reduced ejection fraction) ([place])   2. Atrial fibrillation, unspecified type ([place])    PLAN:    In order of problems listed above:  1. HFrEF, EF 20 to 25%.  Appears euvolemic, stop Lopressor, stop losartan.  Start Toprol-XL 100 mg daily, start Entresto 24/26 mg twice daily.  Schedule patient for right and left heart cath.  Plan for referral to advanced heart failure clinic after heart cath. 2. Persistent atrial fibrillation, rapid ventricular response.  CHA2DS2-VASc of 4(chf, htn, gender, age).  Heart rate controlled,  stop Lopressor, start Toprol-XL, Xarelto 20 mg daily, digoxin for now.  If heart rate controlled at follow-up visit, stop digoxin.  Patient is planning a dental procedure in the next several days, recommend holding Xarelto for 48 hours prior to procedure if needed.  Restart as soon as possible after.   Follow-up after right and left heart cath.   Shared Decision Making/Informed Consent The risks [stroke (1 in 1000), death (1 in 1000), kidney failure [usually temporary] (1 in 500), bleeding (1 in 200), allergic reaction [possibly serious] (1 in 200)], benefits (diagnostic support and management of coronary artery disease) and alternatives of a cardiac catheterization were discussed in detail with Ms. Esworthy and she is willing to proceed.  Medication Adjustments/Labs and Tests Ordered: Current medicines are reviewed at length with the patient today.  Concerns regarding medicines are outlined above.  Orders Placed This Encounter  Procedures  . Basic metabolic panel  . EKG 12-Lead   Meds ordered this encounter  Medications  . metoprolol succinate (TOPROL-XL) 100 MG 24 hr tablet    Sig: Take 1 tablet (100 mg total) by mouth daily. Take with or immediately following a meal.    Dispense:  90 tablet    Refill:  1  .  sacubitril-valsartan (ENTRESTO) 24-26 MG    Sig: Take 1 tablet by mouth 2 (two) times daily.    Dispense:  60 tablet    Refill:  4    Patient Instructions  Medication Instructions:   Your physician has recommended you make the following change in your medication:   1.  STOP taking your Aspirin. 2.  STOP taking your Lopressor. 3.  STOP taking your Losartan. 4.  START taking Toprol XL 100 MG: Take one tab by mouth once a day. 5.  START taking Entresto 24-26 MG: take one tab by mouth twice a day.  *If you need a refill on your cardiac medications before your next appointment, please call your pharmacy*   Lab Work: BMP to be drawn today.  If you have labs (blood work) drawn today and your tests are completely normal, you will receive your results only by: Marland Kitchen MyChart Message (if you have MyChart) OR . A paper copy in the mail If you have any lab test that is abnormal or we need to change your treatment, we will call you to review the results.   Testing/Procedures:  You are scheduled for a Left Heart Cath on _____Friday Jan _21_________ with Dr.___Arida________ Please arrive at the [place] of [facility] at _____0630____ a.m. on the day of your procedure.  DIET INSTRUCTIONS:  Nothing to eat or drink after midnight except your medications with a              sip of water.         Medications:  HOLD your rivaroxaban (XARELTO) 10 MG TABS tablet for 2 days prior to your procedure. HOLD your Delene Loll the day of your procedure. HOLD your torsemide (DEMADEX) 20 MG tablet day of your procedure.   1) Must have a responsible person to drive you home.  2) Bring a current list of your medications and current insurance cards.    If you have any questions after you get home, please call the office at [phone]    Follow-Up: At [facility], you and your health needs are our priority.  As part of our continuing mission to provide you with exceptional heart care, we have created  designated Provider Care Teams.  These  Care Teams include your primary Cardiologist (physician) and Advanced Practice Providers (APPs -  Physician Assistants and Nurse Practitioners) who all work together to provide you with the care you need, when you need it.  We recommend signing up for the patient portal called "MyChart".  Sign up information is provided on this After Visit Summary.  MyChart is used to connect with patients for Virtual Visits (Telemedicine).  Patients are able to view lab/test results, encounter notes, upcoming appointments, etc.  Non-urgent messages can be sent to your provider as well.   To learn more about what you can do with MyChart, go to [email or web address].    Your next appointment:   2-3 weeks   The format for your next appointment:   In Person  Provider:   Kate Sable, MD   Other Instructions      Signed, Kate Sable, MD  03/16/2020 1:32 PM    [place]

## 2020-03-17 ENCOUNTER — Telehealth: Payer: Self-pay

## 2020-03-17 DIAGNOSIS — I5021 Acute systolic (congestive) heart failure: Secondary | ICD-10-CM

## 2020-03-17 DIAGNOSIS — I5033 Acute on chronic diastolic (congestive) heart failure: Secondary | ICD-10-CM

## 2020-03-17 LAB — BASIC METABOLIC PANEL
BUN/Creatinine Ratio: 14 (ref 12–28)
BUN: 12 mg/dL (ref 8–27)
CO2: 30 mmol/L — ABNORMAL HIGH (ref 20–29)
Calcium: 9.5 mg/dL (ref 8.7–10.3)
Chloride: 98 mmol/L (ref 96–106)
Creatinine, Ser: 0.84 mg/dL (ref 0.57–1.00)
GFR calc Af Amer: 79 mL/min/{1.73_m2} (ref >59)
GFR calc non Af Amer: 69 mL/min/{1.73_m2} (ref >59)
Glucose: 73 mg/dL (ref 65–99)
Potassium: 3.5 mmol/L (ref 3.5–5.2)
Sodium: 142 mmol/L (ref 134–144)

## 2020-03-17 NOTE — Telephone Encounter (Signed)
Per Dr. [name] the upcoming appt scheduled with the [facility] CHF clinic on 03/22/20 is not needed and should be cancelled.  Patient is to proceed with scheduled cardiac cath and f/u appt with him. Patient should be referred to the Advanced CHF clinic in [place]. (referral placed).  Spoke with the patient and made her aware of the above. Patient verbalized understanding and voiced appreciation for the call.

## 2020-03-20 ENCOUNTER — Telehealth: Payer: Self-pay | Admitting: Cardiology

## 2020-03-20 MED ORDER — DIGOXIN 125 MCG PO TABS: 0.1250 mg | ORAL_TABLET | Freq: Every day | ORAL | 0 refills | Status: DC

## 2020-03-20 NOTE — Telephone Encounter (Signed)
Patient fiance calling Patient will be out of digoxin meds on 01/29 but does not see Dr Mylo Red until 02/03 Would like to know if she will be continuing this medication before refill Please call to clarify

## 2020-03-20 NOTE — Telephone Encounter (Signed)
I have reviewed the patient's chart.  Per Dr. Thereasa Solo last office note on 03/16/20:  1. Persistent atrial fibrillation, rapid ventricular response.  CHA2DS2-VASc of 4(chf, htn, gender, age).  Heart rate controlled, stop Lopressor, start Toprol-XL, Xarelto 20 mg daily, digoxin for now.  If heart rate controlled at follow-up visit, stop digoxin  The patient is scheduled for a heat cath on 03/24/20. She is scheduled to follow up with Dr. [name] on 04/06/20.  I called and spoke with the patient' fiance, Shanon Brow (ok per [facility]).  I have advised him of the above MD recommendations regarding digoxin. I advised I will send in a 30 supply of this to make sure she does not miss any doses and that Dr. [name] can make the decision at her next office visit if she will need to discontinue this.  Shanon Brow voices understanding and is agreeable.

## 2020-03-21 ENCOUNTER — Telehealth: Payer: Self-pay | Admitting: Cardiology

## 2020-03-21 NOTE — Telephone Encounter (Signed)
Spoke with patient's DPR. They were told patient has appointment tomorrow with lab but were unaware. In looking at appointment desk, it is for preadmit COVID test. Patient tested positive for COVID 12/26/19. I called [facility] to confirm that still no retest for COVID if within 90 days. Patient's 90-days out from COVID positive test is 03/26/20.   According to Katie at [facility], patient does not need to retest.  She will remove the appointment for tomorrow.  Spoke back with fiance and he is aware patient does not need repeat COVID test at this time.

## 2020-03-21 NOTE — Telephone Encounter (Signed)
Patient fiance calling Needs clarification on whether or not they have lab work before the procedure on Friday States another office mentioned this but they have not heard from [place] on what they need to do Please call to discuss

## 2020-03-22 ENCOUNTER — Ambulatory Visit: Payer: Medicare HMO | Admitting: Family

## 2020-03-22 ENCOUNTER — Other Ambulatory Visit: Payer: Medicare HMO

## 2020-03-24 ENCOUNTER — Other Ambulatory Visit: Payer: Self-pay

## 2020-03-24 ENCOUNTER — Telehealth: Payer: Self-pay | Admitting: Cardiology

## 2020-03-24 ENCOUNTER — Encounter
Admission: RE | Disposition: A | Discharge status: Home / Self Care | Payer: Self-pay | Source: Home / Self Care | Attending: Cardiovascular Disease

## 2020-03-24 ENCOUNTER — Ambulatory Visit
Admission: RE | Admit: 2020-03-24 | Discharge: 2020-03-24 | Disposition: A | Discharge status: Home / Self Care | Payer: Medicare HMO | Attending: Cardiovascular Disease | Admitting: Cardiovascular Disease

## 2020-03-24 ENCOUNTER — Encounter: Payer: Self-pay | Admitting: Cardiovascular Disease

## 2020-03-24 DIAGNOSIS — Z79899 Other long term (current) drug therapy: Secondary | ICD-10-CM | POA: Insufficient documentation

## 2020-03-24 DIAGNOSIS — I5022 Chronic systolic (congestive) heart failure: Secondary | ICD-10-CM | POA: Insufficient documentation

## 2020-03-24 DIAGNOSIS — I4819 Other persistent atrial fibrillation: Secondary | ICD-10-CM | POA: Diagnosis not present

## 2020-03-24 DIAGNOSIS — Z87891 Personal history of nicotine dependence: Secondary | ICD-10-CM | POA: Insufficient documentation

## 2020-03-24 DIAGNOSIS — Z882 Allergy status to sulfonamides status: Secondary | ICD-10-CM | POA: Diagnosis not present

## 2020-03-24 DIAGNOSIS — I5020 Unspecified systolic (congestive) heart failure: Secondary | ICD-10-CM

## 2020-03-24 DIAGNOSIS — Z7901 Long term (current) use of anticoagulants: Secondary | ICD-10-CM | POA: Diagnosis not present

## 2020-03-24 DIAGNOSIS — I110 Hypertensive heart disease with heart failure: Secondary | ICD-10-CM | POA: Insufficient documentation

## 2020-03-24 DIAGNOSIS — I2510 Atherosclerotic heart disease of native coronary artery without angina pectoris: Secondary | ICD-10-CM | POA: Diagnosis not present

## 2020-03-24 HISTORY — PX: RIGHT/LEFT HEART CATH AND CORONARY ANGIOGRAPHY: CATH118266

## 2020-03-24 HISTORY — DX: COVID-19: U07.1

## 2020-03-24 HISTORY — DX: Heart failure, unspecified: I50.9

## 2020-03-24 SURGERY — RIGHT/LEFT HEART CATH AND CORONARY ANGIOGRAPHY
Anesthesia: Moderate Sedation

## 2020-03-24 MED ORDER — SODIUM CHLORIDE 0.9 % IV SOLN: 250.0000 mL | INTRAVENOUS | Status: DC | PRN

## 2020-03-24 MED ORDER — ACETAMINOPHEN 325 MG PO TABS: 650.0000 mg | ORAL_TABLET | ORAL | Status: DC | PRN

## 2020-03-24 MED ORDER — FENTANYL CITRATE (PF) 100 MCG/2ML IJ SOLN
INTRAMUSCULAR | Status: AC
  Filled 2020-03-24: qty 2

## 2020-03-24 MED ORDER — HEPARIN SODIUM (PORCINE) 1000 UNIT/ML IJ SOLN
INTRAMUSCULAR | Status: AC
  Filled 2020-03-24: qty 1

## 2020-03-24 MED ORDER — SODIUM CHLORIDE 0.9 % IV SOLN
INTRAVENOUS | Status: DC
  Administered 2020-03-24: 1000 mL via INTRAVENOUS

## 2020-03-24 MED ORDER — IOHEXOL 300 MG/ML  SOLN
INTRAMUSCULAR | Status: DC | PRN
  Administered 2020-03-24: 30 mL

## 2020-03-24 MED ORDER — VERAPAMIL HCL 2.5 MG/ML IV SOLN
INTRAVENOUS | Status: AC
  Filled 2020-03-24: qty 2

## 2020-03-24 MED ORDER — MIDAZOLAM HCL 2 MG/2ML IJ SOLN
INTRAMUSCULAR | Status: DC | PRN
  Administered 2020-03-24: 1 mg via INTRAVENOUS

## 2020-03-24 MED ORDER — SODIUM CHLORIDE 0.9% FLUSH: 3.0000 mL | Freq: Two times a day (BID) | INTRAVENOUS | Status: DC

## 2020-03-24 MED ORDER — MIDAZOLAM HCL 2 MG/2ML IJ SOLN
INTRAMUSCULAR | Status: AC
  Filled 2020-03-24: qty 2

## 2020-03-24 MED ORDER — RIVAROXABAN 10 MG PO TABS: 10.0000 mg | ORAL_TABLET | Freq: Two times a day (BID) | ORAL | 3 refills | Status: AC

## 2020-03-24 MED ORDER — HEPARIN (PORCINE) IN NACL 1000-0.9 UT/500ML-% IV SOLN
INTRAVENOUS | Status: AC
  Filled 2020-03-24: qty 1000

## 2020-03-24 MED ORDER — HEPARIN SODIUM (PORCINE) 1000 UNIT/ML IJ SOLN
INTRAMUSCULAR | Status: DC | PRN
  Administered 2020-03-24: 3500 [IU] via INTRAVENOUS

## 2020-03-24 MED ORDER — SODIUM CHLORIDE 0.9% FLUSH: 3.0000 mL | INTRAVENOUS | Status: DC | PRN

## 2020-03-24 MED ORDER — HEPARIN (PORCINE) IN NACL 1000-0.9 UT/500ML-% IV SOLN
INTRAVENOUS | Status: DC | PRN
  Administered 2020-03-24: 500 mL

## 2020-03-24 MED ORDER — TORSEMIDE 20 MG PO TABS: 20.0000 mg | ORAL_TABLET | Freq: Every day | ORAL | 0 refills | Status: DC

## 2020-03-24 MED ORDER — VERAPAMIL HCL 2.5 MG/ML IV SOLN
INTRAVENOUS | Status: DC | PRN
  Administered 2020-03-24: 2.5 mg via INTRAVENOUS

## 2020-03-24 MED ORDER — ONDANSETRON HCL 4 MG/2ML IJ SOLN: 4.0000 mg | Freq: Four times a day (QID) | INTRAMUSCULAR | Status: DC | PRN

## 2020-03-24 MED ORDER — FENTANYL CITRATE (PF) 100 MCG/2ML IJ SOLN
INTRAMUSCULAR | Status: DC | PRN
  Administered 2020-03-24: 25 ug via INTRAVENOUS

## 2020-03-24 SURGICAL SUPPLY — 10 items
CATH BALLN WEDGE 5F 110CM (CATHETERS) ×1 IMPLANT
CATH INFINITI 5FR ANG PIGTAIL (CATHETERS) ×1 IMPLANT
CATH INFINITI 5FR JK (CATHETERS) ×1 IMPLANT
DEVICE RAD TR BAND REGULAR (VASCULAR PRODUCTS) ×1 IMPLANT
GLIDESHEATH SLEND SS 6F .021 (SHEATH) ×2 IMPLANT
GUIDEWIRE INQWIRE 1.5J.035X260 (WIRE) IMPLANT
INQWIRE 1.5J .035X260CM (WIRE) ×2
KIT MANI 3VAL PERCEP (MISCELLANEOUS) ×2 IMPLANT
PACK CARDIAC CATH (CUSTOM PROCEDURE TRAY) ×2 IMPLANT
SHEATH AVANTI 6FR X 11CM (SHEATH) IMPLANT

## 2020-03-24 NOTE — Discharge Instructions (Signed)
Resume Xarelto tomorrow as long as no bleeding issues from the right arm.

## 2020-03-24 NOTE — Telephone Encounter (Signed)
Spoke with patients husband and informed him that Dr. [name] would be okay with the changes that Dr. Fletcher Anon made and they will be able to discuss that during her follow up on 04/06/20.   I called [facility] and re-ordered the patients Xarelto for a 90 day supply. The pharm tech said this would be covered by the patients insurance.

## 2020-03-24 NOTE — Interval H&P Note (Signed)
History and Physical Interval Note:  03/24/2020 7:56 AM  Paula Klein  has presented today for surgery, with the diagnosis of RT LT Heart Cath   HF with reduced ejection fraction.  The various methods of treatment have been discussed with the patient and family. After consideration of risks, benefits and other options for treatment, the patient has consented to  Procedure(s): RIGHT/LEFT HEART CATH AND CORONARY ANGIOGRAPHY (N/A) as a surgical intervention.  The patient's history has been reviewed, patient examined, no change in status, stable for surgery.  I have reviewed the patient's chart and labs.  Questions were answered to the patient's satisfaction.     Kathlyn Sacramento

## 2020-03-24 NOTE — Telephone Encounter (Signed)
Pt c/o medication issue:  1. Name of Medication: xarelto and torsemide   2. How are you currently taking this medication (dosage and times per day)? xarelto 10 mg po BID and torsemide 20 mg po q d   3. Are you having a reaction (difficulty breathing--STAT)? No   4. What is your medication issue? Per patient spouse [employer] needs authorization for change in xarelto and torsemide was recently changed to once a day .  Patient wants to confirm torsemide dose decrease is ok with [name] and that the prior auth will be submitted to [facility] for the next refill.

## 2020-03-28 ENCOUNTER — Telehealth: Payer: Self-pay | Admitting: Cardiology

## 2020-03-28 NOTE — Telephone Encounter (Signed)
Dr. [name]- The patients Torsemide was reduced to 20 MG once a day rather that BID after her Cath Lab procedure by Dr. Fletcher Anon. Did you want to stay with this dose?

## 2020-03-28 NOTE — Telephone Encounter (Signed)
Liberal with [employer] calling  Would like to discuss possible medication change Patient was seen in office with a 3 lb weight gain  Please call to discuss

## 2020-03-28 NOTE — Telephone Encounter (Signed)
Spoke with patients fiance and informed him to give Paula Klein an extra dose of her Torsemide (20 MG) today, until we receive further instructions from Dr. Fletcher Anon or Dr. [name].  Patients fiance verbalized understanding and agreed with plan.

## 2020-03-28 NOTE — Telephone Encounter (Signed)
Pt c/o swelling: STAT is pt has developed SOB within 24 hours  1) How much weight have you gained and in what time span? 3 lbs overnight  2) If swelling, where is the swelling located? ankles  3) Are you currently taking a fluid pill? Torsemide 20 mg (40 mg before heart cath)  4) Are you currently SOB? no  5) Do you have a log of your daily weights (if so, list)?   1/25 138.4  1/24 135.6  1/23 136.4  1/22 135.4  6) Have you gained 3 pounds in a day or 5 pounds in a week? yes  7) Have you traveled recently? No Also having issues going to the bathroom

## 2020-03-30 MED ORDER — POTASSIUM CHLORIDE CRYS ER 20 MEQ PO TBCR: 20.0000 meq | EXTENDED_RELEASE_TABLET | Freq: Every day | ORAL | 3 refills | Status: AC

## 2020-03-30 NOTE — Addendum Note (Signed)
Addended by: Kavin Leech on: 03/30/2020 02:18 PM   Modules accepted: Orders

## 2020-03-30 NOTE — Telephone Encounter (Signed)
Patient fiance calling back for advise .

## 2020-03-30 NOTE — Telephone Encounter (Signed)
Called and spoke with patients fiance and informed him of the below copied and pasted note from Dr. [name] . Shanon Brow verbalized understanding and agreed with plan.  Kate Sable, MD  You 18 hours ago (7:17 PM)   Okay for patient to take extra dose of torsemide with weight gain. Please have patient take KCl 20 mEq daily. Continue torsemide 20 mg twice daily (40 mg daily )until follow-up with myself. Ty   Routing comment

## 2020-04-06 ENCOUNTER — Encounter: Payer: Self-pay | Admitting: Cardiology

## 2020-04-06 ENCOUNTER — Other Ambulatory Visit: Payer: Self-pay

## 2020-04-06 ENCOUNTER — Ambulatory Visit (INDEPENDENT_AMBULATORY_CARE_PROVIDER_SITE_OTHER): Payer: Medicare Other | Admitting: Cardiology

## 2020-04-06 VITALS — BP 120/70 | HR 84 | Ht 61.0 in | Wt 142.1 lb

## 2020-04-06 DIAGNOSIS — I4891 Unspecified atrial fibrillation: Secondary | ICD-10-CM

## 2020-04-06 DIAGNOSIS — I428 Other cardiomyopathies: Secondary | ICD-10-CM

## 2020-04-06 DIAGNOSIS — I2510 Atherosclerotic heart disease of native coronary artery without angina pectoris: Secondary | ICD-10-CM

## 2020-04-06 MED ORDER — ENTRESTO 49-51 MG PO TABS: 1.0000 | ORAL_TABLET | Freq: Two times a day (BID) | ORAL | 3 refills | Status: AC

## 2020-04-06 MED ORDER — ATORVASTATIN CALCIUM 40 MG PO TABS: 40.0000 mg | ORAL_TABLET | Freq: Every day | ORAL | 5 refills | Status: DC

## 2020-04-06 MED ORDER — TORSEMIDE 20 MG PO TABS: 20.0000 mg | ORAL_TABLET | Freq: Two times a day (BID) | ORAL | 3 refills | Status: DC

## 2020-04-06 NOTE — Patient Instructions (Signed)
Medication Instructions:   Your physician has recommended you make the following change in your medication:   1.  STOP taking your Digoxin.  2.  INCREASE your Entresto to 49-51 MG: Take 1 tab twice a day. 3.  START taking Lipitor 40 MG.   *If you need a refill on your cardiac medications before your next appointment, please call your pharmacy*   Lab Work: None ordered If you have labs (blood work) drawn today and your tests are completely normal, you will receive your results only by: Marland Kitchen MyChart Message (if you have MyChart) OR . A paper copy in the mail If you have any lab test that is abnormal or we need to change your treatment, we will call you to review the results.   Testing/Procedures: None ordered   Follow-Up: At [facility], you and your health needs are our priority.  As part of our continuing mission to provide you with exceptional heart care, we have created designated Provider Care Teams.  These Care Teams include your primary Cardiologist (physician) and Advanced Practice Providers (APPs -  Physician Assistants and Nurse Practitioners) who all work together to provide you with the care you need, when you need it.  We recommend signing up for the patient portal called "MyChart".  Sign up information is provided on this After Visit Summary.  MyChart is used to connect with patients for Virtual Visits (Telemedicine).  Patients are able to view lab/test results, encounter notes, upcoming appointments, etc.  Non-urgent messages can be sent to your provider as well.   To learn more about what you can do with MyChart, go to [email or web address].    Your next appointment:   6 week(s)  The format for your next appointment:   In Person  Provider:   Kate Sable, MD   Other Instructions  Dr. Haroldine Laws,  Dr. Aundra Dubin ([facility])   [phone]

## 2020-04-06 NOTE — Progress Notes (Signed)
Cardiology Office Note:    Date:  04/06/2020   ID:  Paula Klein, DOB 31-Dec-1945, MRN [id number]  PCP:  Paula Merles, MD  [facility] Cardiologist:  Paula Sable, MD  [place] Electrophysiologist:  None   Referring MD: Paula Merles, MD   Chief Complaint  Patient presents with  . 2-3 week follow up    Patient c/o LE edema. Medications reviewed by the patient verbally.     History of Present Illness:    Paula Klein is a 75 y.o. female with a hx of HFrEF, EF 25%, persistent A. fib, hypertension, DVT on Xarelto, COPD, former smoker who presents for follow-up.    Previously seen for heart failure reduced ejection fraction, started on guideline directed medical therapy including Toprol-XL and Entresto.  Underwent right and left heart cath which showed nonobstructive CAD.  She feels well, states having left lower extremity swelling, denies chest pain or shortness of breath.  Prior notes Echocardiogram 02/2020 severely reduced ejection fraction, EF 20 to 25%.  Moderately dilated LA, moderately dilated RA.  Moderate MR. Left heart cath 03/2020 mild nonobstructive CAD, proximal LAD 20%, proximal RCA 30%.  Past Medical History:  Diagnosis Date  . CHF (congestive heart failure) ([place])   . COVID-19   . H/O blood clots   . Heart murmur   . Hypertension     Past Surgical History:  Procedure Laterality Date  . COLONOSCOPY WITH PROPOFOL N/A 09/26/2015   Procedure: COLONOSCOPY WITH PROPOFOL;  Surgeon: Lollie Sails, MD;  Location: [facility] ENDOSCOPY;  Service: Endoscopy;  Laterality: N/A;  . NO PAST SURGERIES    . RIGHT/LEFT HEART CATH AND CORONARY ANGIOGRAPHY N/A 03/24/2020   Procedure: RIGHT/LEFT HEART CATH AND CORONARY ANGIOGRAPHY;  Surgeon: Wellington Hampshire, MD;  Location: [facility] CV LAB;  Service: Cardiovascular;  Laterality: N/A;    Current Medications: Current Meds  Medication Sig  . albuterol (PROVENTIL HFA;VENTOLIN HFA) 108 (90 Base) MCG/ACT inhaler  Inhale 2 puffs into the lungs every 6 (six) hours as needed for shortness of breath.  . Ascorbic Acid (VITAMIN C) 1000 MG tablet Take 1,000 mg by mouth every evening.  Marland Kitchen atorvastatin (LIPITOR) 40 MG tablet Take 1 tablet (40 mg total) by mouth daily.  . Cholecalciferol (VITAMIN D3) 50 MCG (2000 UT) TABS Take 2,000 Units by mouth daily.  . clonazePAM (KLONOPIN) 0.5 MG tablet Take 0.5 mg by mouth at bedtime.  Marland Kitchen estradiol (ESTRACE) 0.5 MG tablet Take 0.5 mg by mouth daily.  . fluticasone furoate-vilanterol (BREO ELLIPTA) 100-25 MCG/INH AEPB Inhale 1 puff into the lungs daily.  . magnesium oxide (MAG-OX) 400 MG tablet Take 400 mg by mouth daily.  . medroxyPROGESTERone (PROVERA) 2.5 MG tablet Take 2.5 mg by mouth daily.  . metoprolol succinate (TOPROL-XL) 100 MG 24 hr tablet Take 1 tablet (100 mg total) by mouth daily. Take with or immediately following a meal.  . Misc Natural Products (NEURIVA PO) Take 1 capsule by mouth daily.  . Multiple Vitamins-Minerals (PRESERVISION AREDS 2 PO) Take 1 tablet by mouth in the morning and at bedtime.  . pantoprazole (PROTONIX) 40 MG tablet Take 40 mg by mouth daily as needed (acid reflux.).  Marland Kitchen polyethylene glycol (MIRALAX / GLYCOLAX) 17 g packet Take 17 g by mouth daily.  . potassium chloride SA (KLOR-CON) 20 MEQ tablet Take 1 tablet (20 mEq total) by mouth daily.  . quiNINE (QUALAQUIN) 324 MG capsule Take 324 mg by mouth at bedtime as needed (cramping).  Marland Kitchen  rivaroxaban (XARELTO) 10 MG TABS tablet Take 1 tablet (10 mg total) by mouth in the morning and at bedtime.  . sacubitril-valsartan (ENTRESTO) 49-51 MG Take 1 tablet by mouth 2 (two) times daily.  Marland Kitchen senna-docusate (SENOKOT-S) 8.6-50 MG tablet Take 1 tablet by mouth daily.  . [DISCONTINUED] digoxin (LANOXIN) 0.125 MG tablet Take 125 mcg by mouth daily.  . [DISCONTINUED] sacubitril-valsartan (ENTRESTO) 24-26 MG Take 1 tablet by mouth 2 (two) times daily.  . [DISCONTINUED] torsemide (DEMADEX) 20 MG tablet Take 1  tablet (20 mg total) by mouth daily. (Patient taking differently: Take 20 mg by mouth 2 (two) times daily.)     Allergies:   Pine tar, Sulfa antibiotics, and Tetracyclines & related   Social History   Socioeconomic History  . Marital status: Widowed    Spouse name: Not on file  . Number of children: Not on file  . Years of education: Not on file  . Highest education level: Not on file  Occupational History  . Not on file  Tobacco Use  . Smoking status: Former Smoker    Packs/day: 3.00    Years: 30.00    Pack years: 90.00    Quit date: 02/03/1989    Years since quitting: 31.1  . Smokeless tobacco: Never Used  Vaping Use  . Vaping Use: Never used  Substance and Sexual Activity  . Alcohol use: Yes    Comment: rarely  . Drug use: No  . Sexual activity: Not on file  Other Topics Concern  . Not on file  Social History Narrative  . Not on file   Social Determinants of Health   Financial Resource Strain: Not on file  Food Insecurity: Not on file  Transportation Needs: Not on file  Physical Activity: Not on file  Stress: Not on file  Social Connections: Not on file     Family History: The patient's family history includes AAA (abdominal aortic aneurysm) in her mother; Cancer in her brother; Heart attack in her father; Varicose Veins in her mother.  ROS:   Please see the history of present illness.     All other systems reviewed and are negative.  EKGs/Labs/Other Studies Reviewed:    The following studies were reviewed today:   EKG:  EKG is  ordered today.  The ekg ordered today demonstrates atrial fibrillation, heart rate 84  Recent Labs: 02/26/2020: B Natriuretic Peptide 1,132.2 03/02/2020: ALT 303; Hemoglobin 11.5; Magnesium 1.8; Platelets 646 03/16/2020: BUN 12; Creatinine, Ser 0.84; Potassium 3.5; Sodium 142  Recent Lipid Panel No results found for: CHOL, TRIG, HDL, CHOLHDL, VLDL, LDLCALC, LDLDIRECT   Risk Assessment/Calculations:      Physical Exam:     VS:  BP 120/70 (BP Location: Left Arm, Cuff Size: Normal)   Pulse 84   Ht 5\' 1"  (1.549 m)   Wt 142 lb 2 oz (64.5 kg)   SpO2 99%   BMI 26.85 kg/m     Wt Readings from Last 3 Encounters:  04/06/20 142 lb 2 oz (64.5 kg)  03/24/20 146 lb (66.2 kg)  03/16/20 140 lb 2 oz (63.6 kg)     GEN:  Well nourished, well developed in no acute distress HEENT: Normal NECK: No JVD; No carotid bruits LYMPHATICS: No lymphadenopathy CARDIAC: Distant heart sounds, irregular irregular RESPIRATORY: Decreased breath sounds at bases ABDOMEN: Soft, non-tender, distended MUSCULOSKELETAL: 1+  edema; worse on left, varicose veins noted no deformity  SKIN: Warm and dry NEUROLOGIC:  Alert and oriented x 3 PSYCHIATRIC:  Normal affect   ASSESSMENT:    1. NICM (nonischemic cardiomyopathy) ([place])   2. Coronary artery disease involving native coronary artery of native heart without angina pectoris   3. Atrial fibrillation, unspecified type ([place])    PLAN:    In order of problems listed above:  1. Nonischemic cardiomyopathy,EF 20 to 25%.  Describes NYHA class II symptoms.  Increase Entresto to 49/51 mg twice daily.  Continue Toprol-XL 100 mg daily, torsemide.  Plan to add Aldactone at follow-up visit if BP permits.  Refer patient to advanced heart failure clinic. 2. CAD, left heart cath with mild nonobstructive CAD, RCA 30%, proximal to mid LAD 20%.  Start Lipitor 40 mg daily, continue Xarelto.  No aspirin as patient is on Xarelto 3. Persistent atrial fibrillation, heart rate controlled..  CHA2DS2-VASc of 4(chf, htn, gender, age).  Continue Toprol-XL 100 mg daily, Xarelto.  Plan for DC cardioversion after 4 weeks of uninterrupted anticoagulation.  Unsure if heart failure is secondary to arrhythmia/A. fib.  Follow-up in 6 weeks    Medication Adjustments/Labs and Tests Ordered: Current medicines are reviewed at length with the patient today.  Concerns regarding medicines are outlined above.  Orders Placed This  Encounter  Procedures  . EKG 12-Lead   Meds ordered this encounter  Medications  . sacubitril-valsartan (ENTRESTO) 49-51 MG    Sig: Take 1 tablet by mouth 2 (two) times daily.    Dispense:  180 tablet    Refill:  3  . torsemide (DEMADEX) 20 MG tablet    Sig: Take 1 tablet (20 mg total) by mouth 2 (two) times daily.    Dispense:  60 tablet    Refill:  3  . atorvastatin (LIPITOR) 40 MG tablet    Sig: Take 1 tablet (40 mg total) by mouth daily.    Dispense:  30 tablet    Refill:  5    Patient Instructions  Medication Instructions:   Your physician has recommended you make the following change in your medication:   1.  STOP taking your Digoxin.  2.  INCREASE your Entresto to 49-51 MG: Take 1 tab twice a day. 3.  START taking Lipitor 40 MG.   *If you need a refill on your cardiac medications before your next appointment, please call your pharmacy*   Lab Work: None ordered If you have labs (blood work) drawn today and your tests are completely normal, you will receive your results only by: Marland Kitchen MyChart Message (if you have MyChart) OR . A paper copy in the mail If you have any lab test that is abnormal or we need to change your treatment, we will call you to review the results.   Testing/Procedures: None ordered   Follow-Up: At [facility], you and your health needs are our priority.  As part of our continuing mission to provide you with exceptional heart care, we have created designated Provider Care Teams.  These Care Teams include your primary Cardiologist (physician) and Advanced Practice Providers (APPs -  Physician Assistants and Nurse Practitioners) who all work together to provide you with the care you need, when you need it.  We recommend signing up for the patient portal called "MyChart".  Sign up information is provided on this After Visit Summary.  MyChart is used to connect with patients for Virtual Visits (Telemedicine).  Patients are able to view lab/test  results, encounter notes, upcoming appointments, etc.  Non-urgent messages can be sent to your provider as well.   To learn more  about what you can do with MyChart, go to [email or web address].    Your next appointment:   6 week(s)  The format for your next appointment:   In Person  Provider:   Kate Sable, MD   Other Instructions  Dr. Haroldine Laws,  Dr. Aundra Dubin ([facility])   [phone]    Signed, Paula Sable, MD  04/06/2020 1:11 PM    [place]

## 2020-04-20 ENCOUNTER — Telehealth: Payer: Self-pay | Admitting: Cardiology

## 2020-04-20 MED ORDER — POTASSIUM CHLORIDE ER 10 MEQ PO TBCR: 10.0000 meq | EXTENDED_RELEASE_TABLET | Freq: Every day | ORAL | 5 refills | Status: DC

## 2020-04-20 MED ORDER — TORSEMIDE 20 MG PO TABS: ORAL_TABLET | ORAL | 5 refills | Status: DC

## 2020-04-20 NOTE — Telephone Encounter (Signed)
Called patient and relayed Dr. Thereasa Solo recommendation as copied and pasted below:  Increase torsemide dose to 40 mg in the a.m., 20 mg in the p.m. Increase potassium to 30 mEq daily. Thank you   Routing comment    Patient verbalized understanding and agreed with plan

## 2020-04-20 NOTE — Telephone Encounter (Signed)
Increase torsemide dose to 40 mg in the a.m., 20 mg in the p.m. Increase potassium to 30 mEq daily. Thank you   Routing comment

## 2020-04-20 NOTE — Telephone Encounter (Signed)
[facility] calling to check on status of torsemide prescription

## 2020-04-20 NOTE — Telephone Encounter (Signed)
[facility] and Left a VM stating that 2 prescriptions had been sent for this patient and it shows on our end that they were sent.

## 2020-04-20 NOTE — Telephone Encounter (Signed)
Pt c/o swelling: STAT is pt has developed SOB within 24 hours  1) How much weight have you gained and in what time span? 3-4 lbs overnight (usuing another scale than usual but can tell a noticeable difference)  2) If swelling, where is the swelling located? Stomach and both legs  3) Are you currently taking a fluid pill? Yes, torsemide 20 mg bid  (running out)  4) Are you currently SOB?  no  5) Do you have a log of your daily weights (if so, list)?   2/15 139  2/16 140  2/17 144.8  6) Have you gained 3 pounds in a day or 5 pounds in a week? yes  7) Have you traveled recently? no

## 2020-04-20 NOTE — Telephone Encounter (Signed)
Patient calling back in stating the pharmacy did not receive the torsemide prescription. [facility]

## 2020-04-21 ENCOUNTER — Other Ambulatory Visit: Payer: Self-pay

## 2020-04-21 ENCOUNTER — Telehealth: Payer: Self-pay

## 2020-04-21 MED ORDER — TORSEMIDE 20 MG PO TABS: ORAL_TABLET | ORAL | 5 refills | Status: DC

## 2020-04-21 NOTE — Telephone Encounter (Signed)
Torsemide RX sent to pharmacy yesterday but was on "No Print" so pharmacy did not receive. Resent Rx as requested. Requested Prescriptions   Signed Prescriptions Disp Refills  . torsemide (DEMADEX) 20 MG tablet 90 tablet 5    Sig: Take 2 tablets by mouth every AM and 1 tablet by mouth in the PM.    Authorizing Provider: Kate Sable    Ordering User: Raelene Bott, BRANDY L

## 2020-04-21 NOTE — Telephone Encounter (Signed)
*  STAT* If patient is at the pharmacy, call can be transferred to refill team.   1. Which medications need to be refilled? (please list name of each medication and dose if known) Torsemide  2. Which pharmacy/location (including street and city if local pharmacy) is medication to be sent to?[facility]  3. Do they need a 30 day or 90 day supply? [place]

## 2020-04-24 ENCOUNTER — Telehealth: Payer: Self-pay | Admitting: Cardiology

## 2020-04-24 NOTE — Telephone Encounter (Signed)
Please call to discuss Xarelto. Patient's fiance states there is some discrepancies with the insurance company paying for this.

## 2020-04-24 NOTE — Telephone Encounter (Signed)
Spoke with the patients fiance Shanon Brow. Cherylynn Ridges that the patient will need a priro auth for her current Xarelto prescription. Rosana Hoes sts that S=she was initially prescribed 20 mg daily but could not tolerate and the Rx for Xarelto was changed to 10 mg bid.  Catarina Hartshorn that I will fwd the rest to initiate the prior auth for the patients Xarelto.

## 2020-04-25 ENCOUNTER — Telehealth: Payer: Self-pay | Admitting: *Deleted

## 2020-04-25 NOTE — Telephone Encounter (Signed)
[employer] is reviewing your PA request. Typically an electronic response will be received within 72 hours. To check for an update later, open this request from your dashboard.    You may close this dialog and return to your dashboard to perform other tasks.

## 2020-04-25 NOTE — Telephone Encounter (Signed)
Pt requiring PA for Xarelto 10 mg bid. PA has been submitted through covermymeds. Awaiting response.  [employer] ID: [id number] Rx Bin: [id number] PCN: [id number] Rx Group: [id number]

## 2020-04-25 NOTE — Telephone Encounter (Signed)
Request Reference Number: [id number]. XARELTO TAB 10MG  is approved through 03/03/2021. Your patient may now fill this prescription and it will be covered.

## 2020-05-08 ENCOUNTER — Telehealth: Payer: Self-pay | Admitting: Cardiology

## 2020-05-08 NOTE — Telephone Encounter (Signed)
Patient went to see her PCP today and her BP was low 90/60 and then 91/59. PCP recommended to skip next dose of metoprolol 100 mg (pt takes this at night). Patient would like to be advised from a cardiac stand point before skipping medication

## 2020-05-09 MED ORDER — METOPROLOL SUCCINATE ER 100 MG PO TB24: 50.0000 mg | ORAL_TABLET | Freq: Every day | ORAL | 1 refills | Status: DC

## 2020-05-09 NOTE — Telephone Encounter (Signed)
Called patient and relayed Dr. Thereasa Solo recommendation as copied and pasted below:  Decrease Toprol-XL to 50 mg daily.

## 2020-05-18 ENCOUNTER — Ambulatory Visit (INDEPENDENT_AMBULATORY_CARE_PROVIDER_SITE_OTHER): Payer: Medicare Other | Admitting: Cardiology

## 2020-05-18 ENCOUNTER — Other Ambulatory Visit: Payer: Self-pay

## 2020-05-18 ENCOUNTER — Encounter: Payer: Self-pay | Admitting: Cardiology

## 2020-05-18 VITALS — BP 104/74 | HR 126 | Ht 61.0 in | Wt 144.8 lb

## 2020-05-18 DIAGNOSIS — I2510 Atherosclerotic heart disease of native coronary artery without angina pectoris: Secondary | ICD-10-CM

## 2020-05-18 DIAGNOSIS — I4891 Unspecified atrial fibrillation: Secondary | ICD-10-CM

## 2020-05-18 DIAGNOSIS — I428 Other cardiomyopathies: Secondary | ICD-10-CM | POA: Diagnosis not present

## 2020-05-18 NOTE — H&P (View-Only) (Signed)
Cardiology Office Note:    Date:  05/18/2020   ID:  Paula Klein, DOB [birth year], MRN [id number]  PCP:  Marguerita Merles, MD  [facility] Cardiologist:  Kate Sable, MD  [place] Electrophysiologist:  None   Referring MD: Marguerita Merles, MD   Chief Complaint  Patient presents with  . Follow-up    6 weeks    History of Present Illness:    Paula Klein is a 75 y.o. female with a hx of NICM EF 25%, persistent A. fib, hypertension, DVT on Xarelto, COPD, former smoker who presents for follow-up.    Patient being seen for heart failure and atrial fibrillation.  Entresto was increased to 49/51 mg twice daily.  Denies palpitations or shortness of breath.  Gained some weight, torsemide was increased to 60 mg daily.  Her weight has been stable around 141 pounds.  Prior notes Echocardiogram 02/2020 severely reduced ejection fraction, EF 20 to 25%.  Moderately dilated LA, moderately dilated RA.  Moderate MR. Left heart cath 03/2020 mild nonobstructive CAD, proximal LAD 20%, proximal RCA 30%.  Past Medical History:  Diagnosis Date  . CHF (congestive heart failure) ([facility])   . COVID-19   . H/O blood clots   . Heart murmur   . Hypertension     Past Surgical History:  Procedure Laterality Date  . COLONOSCOPY WITH PROPOFOL N/A 09/26/2015   Procedure: COLONOSCOPY WITH PROPOFOL;  Surgeon: Lollie Sails, MD;  Location: [facility] ENDOSCOPY;  Service: Endoscopy;  Laterality: N/A;  . NO PAST SURGERIES    . RIGHT/LEFT HEART CATH AND CORONARY ANGIOGRAPHY N/A 03/24/2020   Procedure: RIGHT/LEFT HEART CATH AND CORONARY ANGIOGRAPHY;  Surgeon: Wellington Hampshire, MD;  Location: [place] CV LAB;  Service: Cardiovascular;  Laterality: N/A;    Current Medications: Current Meds  Medication Sig  . albuterol (PROVENTIL HFA;VENTOLIN HFA) 108 (90 Base) MCG/ACT inhaler Inhale 2 puffs into the lungs every 6 (six) hours as needed for shortness of breath.  Marland Kitchen amoxicillin (AMOXIL) 500 MG capsule  Take 2,000 mg by mouth See admin instructions. Take 4 capsules (2000 mg) by mouth 1 hour prior to dental appointment  . Ascorbic Acid (VITAMIN C) 1000 MG tablet Take 1,000 mg by mouth every evening.  Marland Kitchen atorvastatin (LIPITOR) 40 MG tablet Take 1 tablet (40 mg total) by mouth daily.  . Cholecalciferol (VITAMIN D3) 50 MCG (2000 UT) TABS Take 2,000 Units by mouth daily.  . clonazePAM (KLONOPIN) 0.5 MG tablet Take 0.5 mg by mouth at bedtime.  Marland Kitchen estradiol (ESTRACE) 0.5 MG tablet Take 0.5 mg by mouth daily.  . fluticasone furoate-vilanterol (BREO ELLIPTA) 100-25 MCG/INH AEPB Inhale 1 puff into the lungs daily.  . magnesium oxide (MAG-OX) 400 MG tablet Take 400 mg by mouth daily.  . medroxyPROGESTERone (PROVERA) 2.5 MG tablet Take 2.5 mg by mouth daily.  . metoprolol succinate (TOPROL-XL) 100 MG 24 hr tablet Take 0.5 tablets (50 mg total) by mouth daily. Take with or immediately following a meal.  . Misc Natural Products (NEURIVA PO) Take 1 capsule by mouth daily.  . Multiple Vitamins-Minerals (PRESERVISION AREDS 2 PO) Take 1 tablet by mouth in the morning and at bedtime.  . pantoprazole (PROTONIX) 40 MG tablet Take 40 mg by mouth daily as needed (acid reflux.).  Marland Kitchen polyethylene glycol (MIRALAX / GLYCOLAX) 17 g packet Take 17 g by mouth daily.  . potassium chloride (KLOR-CON) 10 MEQ tablet Take 1 tablet (10 mEq total) by mouth daily. In the evening  in addition to your 20 MEQ in the AM  . potassium chloride SA (KLOR-CON) 20 MEQ tablet Take 1 tablet (20 mEq total) by mouth daily.  . quiNINE (QUALAQUIN) 324 MG capsule Take 324 mg by mouth at bedtime as needed (cramping).  . rivaroxaban (XARELTO) 10 MG TABS tablet Take 1 tablet (10 mg total) by mouth in the morning and at bedtime.  . sacubitril-valsartan (ENTRESTO) 49-51 MG Take 1 tablet by mouth 2 (two) times daily.  Marland Kitchen senna-docusate (SENOKOT-S) 8.6-50 MG tablet Take 1 tablet by mouth daily.  Marland Kitchen torsemide (DEMADEX) 20 MG tablet Take 2 tablets by mouth every  AM and 1 tablet by mouth in the PM.     Allergies:   Pine tar, Sulfa antibiotics, and Tetracyclines & related   Social History   Socioeconomic History  . Marital status: Widowed    Spouse name: Not on file  . Number of children: Not on file  . Years of education: Not on file  . Highest education level: Not on file  Occupational History  . Not on file  Tobacco Use  . Smoking status: Former Smoker    Packs/day: 3.00    Years: 30.00    Pack years: 90.00    Quit date: 02/03/1989    Years since quitting: 31.3  . Smokeless tobacco: Never Used  Vaping Use  . Vaping Use: Never used  Substance and Sexual Activity  . Alcohol use: Yes    Comment: rarely  . Drug use: No  . Sexual activity: Not on file  Other Topics Concern  . Not on file  Social History Narrative  . Not on file   Social Determinants of Health   Financial Resource Strain: Not on file  Food Insecurity: Not on file  Transportation Needs: Not on file  Physical Activity: Not on file  Stress: Not on file  Social Connections: Not on file     Family History: The patient's family history includes AAA (abdominal aortic aneurysm) in her mother; Cancer in her brother; Heart attack in her father; Varicose Veins in her mother.  ROS:   Please see the history of present illness.     All other systems reviewed and are negative.  EKGs/Labs/Other Studies Reviewed:    The following studies were reviewed today:   EKG:  EKG is  ordered today.  The ekg ordered today demonstrates atrial fibrillation, heart rate 126  Recent Labs: 02/26/2020: B Natriuretic Peptide 1,132.2 03/02/2020: ALT 303; Hemoglobin 11.5; Magnesium 1.8; Platelets 646 03/16/2020: BUN 12; Creatinine, Ser 0.84; Potassium 3.5; Sodium 142  Recent Lipid Panel No results found for: CHOL, TRIG, HDL, CHOLHDL, VLDL, LDLCALC, LDLDIRECT   Risk Assessment/Calculations:      Physical Exam:    VS:  BP 104/74   Pulse (!) 126   Ht 5\' 1"  (1.549 m)   Wt 144 lb  12.8 oz (65.7 kg)   BMI 27.36 kg/m     Wt Readings from Last 3 Encounters:  05/18/20 144 lb 12.8 oz (65.7 kg)  04/06/20 142 lb 2 oz (64.5 kg)  03/24/20 146 lb (66.2 kg)     GEN:  Well nourished, well developed in no acute distress HEENT: Normal NECK: No JVD; No carotid bruits LYMPHATICS: No lymphadenopathy CARDIAC:  irregular irregular RESPIRATORY: Decreased breath sounds at bases ABDOMEN: Soft, non-tender, distended MUSCULOSKELETAL:trace  edema; worse on left, varicose veins noted no deformity  SKIN: Warm and dry NEUROLOGIC:  Alert and oriented x 3 PSYCHIATRIC:  Normal affect  ASSESSMENT:    1. NICM (nonischemic cardiomyopathy) ([place])   2. Coronary artery disease involving native coronary artery of native heart without angina pectoris   3. Atrial fibrillation, unspecified type ([place])    PLAN:    In order of problems listed above:  1. Nonischemic cardiomyopathy,EF 20 to 25%.  Describes NYHA class II symptoms.  Increase Entresto to 49/51 mg twice daily.  Continue Toprol-XL 100 mg daily, torsemide.  Plan to add Aldactone at follow-up visit if BP permits.  Refer patient to advanced heart failure clinic.  CHF could be tachycardia/A. fib induced. 2. CAD, LHC on 03/2020  Showed nonobstructive CAD, RCA 30%, proximal to mid LAD 20%.  cont Lipitor 40 mg daily, Xarelto.  3. Persistent atrial fibrillation, heart rate elevated, asymptomatic..  CHA2DS2-VASc of 4(chf, htn, gender, age).  Continue Toprol-XL 50 mg daily, okay to take extra dose of beta-blocker.  Continue Xarelto.  Plan for DC cardioversion next week.  If DC cardioversion is successful, plan to begin amiodarone for arrhythmia suppression.  Follow-up in 6 weeks    Shared Decision Making/Informed Consent The risks (stroke, cardiac arrhythmias rarely resulting in the need for a temporary or permanent pacemaker, skin irritation or burns and complications associated with conscious sedation including aspiration, arrhythmia,  respiratory failure and death), benefits (restoration of normal sinus rhythm) and alternatives of a direct current cardioversion were explained in detail to Ms. Plamondon and she agrees to proceed.    Medication Adjustments/Labs and Tests Ordered: Current medicines are reviewed at length with the patient today.  Concerns regarding medicines are outlined above.  Orders Placed This Encounter  Procedures  . Basic metabolic panel  . CBC  . EKG 12-Lead   No orders of the defined types were placed in this encounter.   Patient Instructions  Medication Instructions:  Your physician recommends that you continue on your current medications as directed. Please refer to the Current Medication list given to you today.  *If you need a refill on your cardiac medications before your next appointment, please call your pharmacy*   Lab Work:  BMP, CBC to be drawn today  If you have labs (blood work) drawn today and your tests are completely normal, you will receive your results only by: Marland Kitchen MyChart Message (if you have MyChart) OR . A paper copy in the mail If you have any lab test that is abnormal or we need to change your treatment, we will call you to review the results.   Testing/Procedures:  You are scheduled for a Cardioversion on _____Wed 4/23/22___________ with Dr.___Agbor-Etang________ Please arrive at the [place] [facility] at _0700________ a.m. on the day of your procedure.    DIET INSTRUCTIONS:   Nothing to eat or drink after midnight except your medications with a              sip of water.       1)   Medications: Hold your torsemide (DEMADEX) 20 MG tablet morning of procedure  1) Must have a responsible person to drive you home.  2) Bring a current list of your medications and current insurance cards.    COVID PRE- TEST: You will need a COVID TEST prior to the procedure:             LOCATION: [place] [facility] (Walk Inside)                     DATE/TIME:   Mon 05/22/20 (anytime between 8 am  and 1 pm) If you have any questions after you get home, please call the office at [phone]   Follow-Up: At [facility], you and your health needs are our priority.  As part of our continuing mission to provide you with exceptional heart care, we have created designated Provider Care Teams.  These Care Teams include your primary Cardiologist (physician) and Advanced Practice Providers (APPs -  Physician Assistants and Nurse Practitioners) who all work together to provide you with the care you need, when you need it.  We recommend signing up for the patient portal called "MyChart".  Sign up information is provided on this After Visit Summary.  MyChart is used to connect with patients for Virtual Visits (Telemedicine).  Patients are able to view lab/test results, encounter notes, upcoming appointments, etc.  Non-urgent messages can be sent to your provider as well.   To learn more about what you can do with MyChart, go to [email or web address].    Your next appointment:   3 month(s)  The format for your next appointment:   In Person  Provider:   Kate Sable, MD   Other Instructions       Signed, Kate Sable, MD  05/18/2020 12:04 PM    [place]

## 2020-05-18 NOTE — Patient Instructions (Addendum)
Medication Instructions:  Your physician recommends that you continue on your current medications as directed. Please refer to the Current Medication list given to you today.  *If you need a refill on your cardiac medications before your next appointment, please call your pharmacy*   Lab Work:  BMP, CBC to be drawn today  If you have labs (blood work) drawn today and your tests are completely normal, you will receive your results only by: Marland Kitchen MyChart Message (if you have MyChart) OR . A paper copy in the mail If you have any lab test that is abnormal or we need to change your treatment, we will call you to review the results.   Testing/Procedures:  You are scheduled for a Cardioversion on _____Wed 4/23/22___________ with Dr.___Agbor-Etang________ Please arrive at the [place] [facility] at _0700________ a.m. on the day of your procedure.    DIET INSTRUCTIONS:   Nothing to eat or drink after midnight except your medications with a              sip of water.       1)   Medications: Hold your torsemide (DEMADEX) 20 MG tablet morning of procedure  1) Must have a responsible person to drive you home.  2) Bring a current list of your medications and current insurance cards.    COVID PRE- TEST: You will need a COVID TEST prior to the procedure:             LOCATION: [place] Ad[facility] (Walk Inside)                     DATE/TIME:  Mon 05/22/20 (anytime between 8 am and 1 pm) If you have any questions after you get home, please call the office at [phone]   Follow-Up: At [facility], you and your health needs are our priority.  As part of our continuing mission to provide you with exceptional heart care, we have created designated Provider Care Teams.  These Care Teams include your primary Cardiologist (physician) and Advanced Practice Providers (APPs -  Physician Assistants and Nurse Practitioners) who all work together to provide you with the care you need, when you  need it.  We recommend signing up for the patient portal called "MyChart".  Sign up information is provided on this After Visit Summary.  MyChart is used to connect with patients for Virtual Visits (Telemedicine).  Patients are able to view lab/test results, encounter notes, upcoming appointments, etc.  Non-urgent messages can be sent to your provider as well.   To learn more about what you can do with MyChart, go to [email or web address].    Your next appointment:   3 month(s)  The format for your next appointment:   In Person  Provider:   Kate Sable, MD   Other Instructions

## 2020-05-18 NOTE — Progress Notes (Signed)
Cardiology Office Note:    Date:  05/18/2020   ID:  MALAINA MORTELLARO, DOB 04-22-1945, MRN [id number]  PCP:  Marguerita Merles, MD  [facility] Cardiologist:  Kate Sable, MD  [place] Electrophysiologist:  None   Referring MD: Marguerita Merles, MD   Chief Complaint  Patient presents with  . Follow-up    6 weeks    History of Present Illness:    Paula Klein is a 75 y.o. female with a hx of NICM EF 25%, persistent A. fib, hypertension, DVT on Xarelto, COPD, former smoker who presents for follow-up.    Patient being seen for heart failure and atrial fibrillation.  Entresto was increased to 49/51 mg twice daily.  Denies palpitations or shortness of breath.  Gained some weight, torsemide was increased to 60 mg daily.  Her weight has been stable around 141 pounds.  Prior notes Echocardiogram 02/2020 severely reduced ejection fraction, EF 20 to 25%.  Moderately dilated LA, moderately dilated RA.  Moderate MR. Left heart cath 03/2020 mild nonobstructive CAD, proximal LAD 20%, proximal RCA 30%.  Past Medical History:  Diagnosis Date  . CHF (congestive heart failure) ([place])   . COVID-19   . H/O blood clots   . Heart murmur   . Hypertension     Past Surgical History:  Procedure Laterality Date  . COLONOSCOPY WITH PROPOFOL N/A 09/26/2015   Procedure: COLONOSCOPY WITH PROPOFOL;  Surgeon: Lollie Sails, MD;  Location: [facility] ENDOSCOPY;  Service: Endoscopy;  Laterality: N/A;  . NO PAST SURGERIES    . RIGHT/LEFT HEART CATH AND CORONARY ANGIOGRAPHY N/A 03/24/2020   Procedure: RIGHT/LEFT HEART CATH AND CORONARY ANGIOGRAPHY;  Surgeon: Wellington Hampshire, MD;  Location: [place] CV LAB;  Service: Cardiovascular;  Laterality: N/A;    Current Medications: Current Meds  Medication Sig  . albuterol (PROVENTIL HFA;VENTOLIN HFA) 108 (90 Base) MCG/ACT inhaler Inhale 2 puffs into the lungs every 6 (six) hours as needed for shortness of breath.  Marland Kitchen amoxicillin (AMOXIL) 500 MG capsule  Take 2,000 mg by mouth See admin instructions. Take 4 capsules (2000 mg) by mouth 1 hour prior to dental appointment  . Ascorbic Acid (VITAMIN C) 1000 MG tablet Take 1,000 mg by mouth every evening.  Marland Kitchen atorvastatin (LIPITOR) 40 MG tablet Take 1 tablet (40 mg total) by mouth daily.  . Cholecalciferol (VITAMIN D3) 50 MCG (2000 UT) TABS Take 2,000 Units by mouth daily.  . clonazePAM (KLONOPIN) 0.5 MG tablet Take 0.5 mg by mouth at bedtime.  Marland Kitchen estradiol (ESTRACE) 0.5 MG tablet Take 0.5 mg by mouth daily.  . fluticasone furoate-vilanterol (BREO ELLIPTA) 100-25 MCG/INH AEPB Inhale 1 puff into the lungs daily.  . magnesium oxide (MAG-OX) 400 MG tablet Take 400 mg by mouth daily.  . medroxyPROGESTERone (PROVERA) 2.5 MG tablet Take 2.5 mg by mouth daily.  . metoprolol succinate (TOPROL-XL) 100 MG 24 hr tablet Take 0.5 tablets (50 mg total) by mouth daily. Take with or immediately following a meal.  . Misc Natural Products (NEURIVA PO) Take 1 capsule by mouth daily.  . Multiple Vitamins-Minerals (PRESERVISION AREDS 2 PO) Take 1 tablet by mouth in the morning and at bedtime.  . pantoprazole (PROTONIX) 40 MG tablet Take 40 mg by mouth daily as needed (acid reflux.).  Marland Kitchen polyethylene glycol (MIRALAX / GLYCOLAX) 17 g packet Take 17 g by mouth daily.  . potassium chloride (KLOR-CON) 10 MEQ tablet Take 1 tablet (10 mEq total) by mouth daily. In the evening  in addition to your 20 MEQ in the AM  . potassium chloride SA (KLOR-CON) 20 MEQ tablet Take 1 tablet (20 mEq total) by mouth daily.  . quiNINE (QUALAQUIN) 324 MG capsule Take 324 mg by mouth at bedtime as needed (cramping).  . rivaroxaban (XARELTO) 10 MG TABS tablet Take 1 tablet (10 mg total) by mouth in the morning and at bedtime.  . sacubitril-valsartan (ENTRESTO) 49-51 MG Take 1 tablet by mouth 2 (two) times daily.  Marland Kitchen senna-docusate (SENOKOT-S) 8.6-50 MG tablet Take 1 tablet by mouth daily.  Marland Kitchen torsemide (DEMADEX) 20 MG tablet Take 2 tablets by mouth every  AM and 1 tablet by mouth in the PM.     Allergies:   Pine tar, Sulfa antibiotics, and Tetracyclines & related   Social History   Socioeconomic History  . Marital status: Widowed    Spouse name: Not on file  . Number of children: Not on file  . Years of education: Not on file  . Highest education level: Not on file  Occupational History  . Not on file  Tobacco Use  . Smoking status: Former Smoker    Packs/day: 3.00    Years: 30.00    Pack years: 90.00    Quit date: 02/03/1989    Years since quitting: 31.3  . Smokeless tobacco: Never Used  Vaping Use  . Vaping Use: Never used  Substance and Sexual Activity  . Alcohol use: Yes    Comment: rarely  . Drug use: No  . Sexual activity: Not on file  Other Topics Concern  . Not on file  Social History Narrative  . Not on file   Social Determinants of Health   Financial Resource Strain: Not on file  Food Insecurity: Not on file  Transportation Needs: Not on file  Physical Activity: Not on file  Stress: Not on file  Social Connections: Not on file     Family History: The patient's family history includes AAA (abdominal aortic aneurysm) in her mother; Cancer in her brother; Heart attack in her father; Varicose Veins in her mother.  ROS:   Please see the history of present illness.     All other systems reviewed and are negative.  EKGs/Labs/Other Studies Reviewed:    The following studies were reviewed today:   EKG:  EKG is  ordered today.  The ekg ordered today demonstrates atrial fibrillation, heart rate 126  Recent Labs: 02/26/2020: B Natriuretic Peptide 1,132.2 03/02/2020: ALT 303; Hemoglobin 11.5; Magnesium 1.8; Platelets 646 03/16/2020: BUN 12; Creatinine, Ser 0.84; Potassium 3.5; Sodium 142  Recent Lipid Panel No results found for: CHOL, TRIG, HDL, CHOLHDL, VLDL, LDLCALC, LDLDIRECT   Risk Assessment/Calculations:      Physical Exam:    VS:  BP 104/74   Pulse (!) 126   Ht 5\' 1"  (1.549 m)   Wt 144 lb  12.8 oz (65.7 kg)   BMI 27.36 kg/m     Wt Readings from Last 3 Encounters:  05/18/20 144 lb 12.8 oz (65.7 kg)  04/06/20 142 lb 2 oz (64.5 kg)  03/24/20 146 lb (66.2 kg)     GEN:  Well nourished, well developed in no acute distress HEENT: Normal NECK: No JVD; No carotid bruits LYMPHATICS: No lymphadenopathy CARDIAC:  irregular irregular RESPIRATORY: Decreased breath sounds at bases ABDOMEN: Soft, non-tender, distended MUSCULOSKELETAL:trace  edema; worse on left, varicose veins noted no deformity  SKIN: Warm and dry NEUROLOGIC:  Alert and oriented x 3 PSYCHIATRIC:  Normal affect  ASSESSMENT:    1. NICM (nonischemic cardiomyopathy) ([place])   2. Coronary artery disease involving native coronary artery of native heart without angina pectoris   3. Atrial fibrillation, unspecified type ([place])    PLAN:    In order of problems listed above:  1. Nonischemic cardiomyopathy,EF 20 to 25%.  Describes NYHA class II symptoms.  Increase Entresto to 49/51 mg twice daily.  Continue Toprol-XL 100 mg daily, torsemide.  Plan to add Aldactone at follow-up visit if BP permits.  Refer patient to advanced heart failure clinic.  CHF could be tachycardia/A. fib induced. 2. CAD, LHC on 03/2020  Showed nonobstructive CAD, RCA 30%, proximal to mid LAD 20%.  cont Lipitor 40 mg daily, Xarelto.  3. Persistent atrial fibrillation, heart rate elevated, asymptomatic..  CHA2DS2-VASc of 4(chf, htn, gender, age).  Continue Toprol-XL 50 mg daily, okay to take extra dose of beta-blocker.  Continue Xarelto.  Plan for DC cardioversion next week.  If DC cardioversion is successful, plan to begin amiodarone for arrhythmia suppression.  Follow-up in 6 weeks    Shared Decision Making/Informed Consent The risks (stroke, cardiac arrhythmias rarely resulting in the need for a temporary or permanent pacemaker, skin irritation or burns and complications associated with conscious sedation including aspiration, arrhythmia,  respiratory failure and death), benefits (restoration of normal sinus rhythm) and alternatives of a direct current cardioversion were explained in detail to Paula Klein and she agrees to proceed.    Medication Adjustments/Labs and Tests Ordered: Current medicines are reviewed at length with the patient today.  Concerns regarding medicines are outlined above.  Orders Placed This Encounter  Procedures  . Basic metabolic panel  . CBC  . EKG 12-Lead   No orders of the defined types were placed in this encounter.   Patient Instructions  Medication Instructions:  Your physician recommends that you continue on your current medications as directed. Please refer to the Current Medication list given to you today.  *If you need a refill on your cardiac medications before your next appointment, please call your pharmacy*   Lab Work:  BMP, CBC to be drawn today  If you have labs (blood work) drawn today and your tests are completely normal, you will receive your results only by: Marland Kitchen MyChart Message (if you have MyChart) OR . A paper copy in the mail If you have any lab test that is abnormal or we need to change your treatment, we will call you to review the results.   Testing/Procedures:  You are scheduled for a Cardioversion on _____Wed 4/23/22___________ with Dr.___Agbor-Etang________ Please arrive at the [place] [facility] at _0700________ a.m. on the day of your procedure.    DIET INSTRUCTIONS:   Nothing to eat or drink after midnight except your medications with a              sip of water.       1)   Medications: Hold your torsemide (DEMADEX) 20 MG tablet morning of procedure  1) Must have a responsible person to drive you home.  2) Bring a current list of your medications and current insurance cards.    COVID PRE- TEST: You will need a COVID TEST prior to the procedure:             LOCATION: [facility] ([facility])                     DATE/TIME:   Mon 05/22/20 (anytime between 8 am  and 1 pm) If you have any questions after you get home, please call the office at [phone]   Follow-Up: At [facility], you and your health needs are our priority.  As part of our continuing mission to provide you with exceptional heart care, we have created designated Provider Care Teams.  These Care Teams include your primary Cardiologist (physician) and Advanced Practice Providers (APPs -  Physician Assistants and Nurse Practitioners) who all work together to provide you with the care you need, when you need it.  We recommend signing up for the patient portal called "MyChart".  Sign up information is provided on this After Visit Summary.  MyChart is used to connect with patients for Virtual Visits (Telemedicine).  Patients are able to view lab/test results, encounter notes, upcoming appointments, etc.  Non-urgent messages can be sent to your provider as well.   To learn more about what you can do with MyChart, go to [email or web address].    Your next appointment:   3 month(s)  The format for your next appointment:   In Person  Provider:   Kate Sable, MD   Other Instructions       Signed, Kate Sable, MD  05/18/2020 12:04 PM    [place]

## 2020-05-22 ENCOUNTER — Other Ambulatory Visit: Payer: Self-pay

## 2020-05-22 ENCOUNTER — Other Ambulatory Visit
Admission: RE | Admit: 2020-05-22 | Discharge: 2020-05-22 | Disposition: A | Discharge status: Home / Self Care | Payer: Medicare Other | Source: Ambulatory Visit | Attending: Cardiology | Admitting: Cardiology

## 2020-05-22 ENCOUNTER — Other Ambulatory Visit
Admission: RE | Admit: 2020-05-22 | Discharge: 2020-05-22 | Disposition: A | Discharge status: Home / Self Care | Payer: Medicare Other | Source: Home / Self Care | Attending: Cardiology | Admitting: Cardiology

## 2020-05-22 DIAGNOSIS — I4891 Unspecified atrial fibrillation: Secondary | ICD-10-CM | POA: Diagnosis not present

## 2020-05-22 DIAGNOSIS — Z20822 Contact with and (suspected) exposure to covid-19: Secondary | ICD-10-CM | POA: Diagnosis not present

## 2020-05-22 DIAGNOSIS — I428 Other cardiomyopathies: Secondary | ICD-10-CM | POA: Diagnosis present

## 2020-05-22 DIAGNOSIS — I2510 Atherosclerotic heart disease of native coronary artery without angina pectoris: Secondary | ICD-10-CM | POA: Insufficient documentation

## 2020-05-22 LAB — BASIC METABOLIC PANEL
Anion gap: 8 (ref 5–15)
BUN: 20 mg/dL (ref 8–23)
CO2: 28 mmol/L (ref 22–32)
Calcium: 9.1 mg/dL (ref 8.9–10.3)
Chloride: 103 mmol/L (ref 98–111)
Creatinine, Ser: 1.16 mg/dL — ABNORMAL HIGH (ref 0.44–1.00)
GFR, Estimated: 49 mL/min — ABNORMAL LOW (ref >60)
Glucose, Bld: 81 mg/dL (ref 70–99)
Potassium: 3.7 mmol/L (ref 3.5–5.1)
Sodium: 139 mmol/L (ref 135–145)

## 2020-05-22 LAB — CBC
HCT: 37.3 % (ref 36.0–46.0)
Hemoglobin: 11.6 g/dL — ABNORMAL LOW (ref 12.0–15.0)
MCH: 25.2 pg — ABNORMAL LOW (ref 26.0–34.0)
MCHC: 31.1 g/dL (ref 30.0–36.0)
MCV: 81.1 fL (ref 80.0–100.0)
Platelets: 530 10*3/uL — ABNORMAL HIGH (ref 150–400)
RBC: 4.6 MIL/uL (ref 3.87–5.11)
RDW: 23.9 % — ABNORMAL HIGH (ref 11.5–15.5)
WBC: 12.4 10*3/uL — ABNORMAL HIGH (ref 4.0–10.5)
nRBC: 0 % (ref 0.0–0.2)

## 2020-05-22 LAB — SARS CORONAVIRUS 2 (TAT 6-24 HRS): SARS Coronavirus 2: NEGATIVE

## 2020-05-24 ENCOUNTER — Telehealth: Payer: Self-pay

## 2020-05-24 ENCOUNTER — Other Ambulatory Visit: Payer: Self-pay | Admitting: Cardiology

## 2020-05-24 ENCOUNTER — Encounter: Payer: Self-pay | Admitting: Cardiology

## 2020-05-24 ENCOUNTER — Ambulatory Visit: Payer: Medicare Other | Admitting: Certified Registered"

## 2020-05-24 ENCOUNTER — Encounter
Admission: RE | Disposition: A | Discharge status: Home / Self Care | Payer: Self-pay | Source: Home / Self Care | Attending: Cardiology

## 2020-05-24 ENCOUNTER — Ambulatory Visit
Admission: RE | Admit: 2020-05-24 | Discharge: 2020-05-24 | Disposition: A | Discharge status: Home / Self Care | Payer: Medicare Other | Attending: Cardiology | Admitting: Cardiology

## 2020-05-24 DIAGNOSIS — Z86718 Personal history of other venous thrombosis and embolism: Secondary | ICD-10-CM | POA: Insufficient documentation

## 2020-05-24 DIAGNOSIS — J449 Chronic obstructive pulmonary disease, unspecified: Secondary | ICD-10-CM | POA: Diagnosis not present

## 2020-05-24 DIAGNOSIS — Z79899 Other long term (current) drug therapy: Secondary | ICD-10-CM | POA: Diagnosis not present

## 2020-05-24 DIAGNOSIS — Z832 Family history of diseases of the blood and blood-forming organs and certain disorders involving the immune mechanism: Secondary | ICD-10-CM | POA: Diagnosis not present

## 2020-05-24 DIAGNOSIS — I110 Hypertensive heart disease with heart failure: Secondary | ICD-10-CM | POA: Diagnosis not present

## 2020-05-24 DIAGNOSIS — I4819 Other persistent atrial fibrillation: Secondary | ICD-10-CM | POA: Insufficient documentation

## 2020-05-24 DIAGNOSIS — Z8616 Personal history of COVID-19: Secondary | ICD-10-CM | POA: Insufficient documentation

## 2020-05-24 DIAGNOSIS — Z881 Allergy status to other antibiotic agents status: Secondary | ICD-10-CM | POA: Diagnosis not present

## 2020-05-24 DIAGNOSIS — I509 Heart failure, unspecified: Secondary | ICD-10-CM | POA: Insufficient documentation

## 2020-05-24 DIAGNOSIS — Z7901 Long term (current) use of anticoagulants: Secondary | ICD-10-CM | POA: Diagnosis not present

## 2020-05-24 DIAGNOSIS — Z7951 Long term (current) use of inhaled steroids: Secondary | ICD-10-CM | POA: Insufficient documentation

## 2020-05-24 DIAGNOSIS — I428 Other cardiomyopathies: Secondary | ICD-10-CM | POA: Insufficient documentation

## 2020-05-24 DIAGNOSIS — Z882 Allergy status to sulfonamides status: Secondary | ICD-10-CM | POA: Insufficient documentation

## 2020-05-24 DIAGNOSIS — Z8249 Family history of ischemic heart disease and other diseases of the circulatory system: Secondary | ICD-10-CM | POA: Insufficient documentation

## 2020-05-24 DIAGNOSIS — I2510 Atherosclerotic heart disease of native coronary artery without angina pectoris: Secondary | ICD-10-CM | POA: Insufficient documentation

## 2020-05-24 DIAGNOSIS — Z87891 Personal history of nicotine dependence: Secondary | ICD-10-CM | POA: Diagnosis not present

## 2020-05-24 HISTORY — PX: CARDIOVERSION: SHX1299

## 2020-05-24 SURGERY — CARDIOVERSION
Anesthesia: General

## 2020-05-24 MED ORDER — AMIODARONE HCL 200 MG PO TABS: 400.0000 mg | ORAL_TABLET | Freq: Two times a day (BID) | ORAL | 3 refills | Status: DC

## 2020-05-24 MED ORDER — PROPOFOL 10 MG/ML IV BOLUS
INTRAVENOUS | Status: DC | PRN
  Administered 2020-05-24: 50 mg via INTRAVENOUS

## 2020-05-24 MED ORDER — AMIODARONE HCL 200 MG PO TABS: ORAL_TABLET | ORAL | 3 refills | Status: DC

## 2020-05-24 NOTE — Addendum Note (Signed)
Addended by: Kavin Leech on: 05/24/2020 09:29 AM   Modules accepted: Orders

## 2020-05-24 NOTE — Anesthesia Postprocedure Evaluation (Signed)
Anesthesia Post Note  Patient: Paula Klein  Procedure(s) Performed: CARDIOVERSION (N/A )  Patient location during evaluation: Other (specials recovery) Anesthesia Type: General Level of consciousness: awake and alert and oriented Pain management: pain level controlled Vital Signs Assessment: post-procedure vital signs reviewed and stable Respiratory status: spontaneous breathing, nonlabored ventilation and respiratory function stable Cardiovascular status: blood pressure returned to baseline and stable Postop Assessment: no signs of nausea or vomiting Anesthetic complications: no   No complications documented.   Last Vitals:  Vitals:   05/24/20 0834 05/24/20 0835  BP:    Pulse: (!) 45 (!) 50  Resp: 13 14  Temp:    SpO2: 99% 99%    Last Pain:  Vitals:   05/24/20 0722  TempSrc: Oral                 Phuong Hillary

## 2020-05-24 NOTE — Interval H&P Note (Signed)
History and Physical Interval Note:  05/24/2020 7:24 AM  Paula Klein  has presented today for surgery, with the diagnosis of  Afib .The various methods of treatment have been discussed with the patient and family. After consideration of risks, benefits and other options for treatment, the patient has consented to  Procedure(s): CARDIOVERSION (N/A) as a surgical intervention.  The patient's history has been reviewed, patient examined, no change in status, stable for surgery.  I have reviewed the patient's chart and labs.  Questions were answered to the patient's satisfaction.     Aaron Edelman Agbor-Etang

## 2020-05-24 NOTE — Telephone Encounter (Signed)
Received secure chat from Dr. [name] requesting the following:  Please have her follow up with ME in 4-6weeks.  Will send to scheduling to schedule patient with [facility] only.

## 2020-05-24 NOTE — Transfer of Care (Signed)
Immediate Anesthesia Transfer of Care Note  Patient: Paula Klein  Procedure(s) Performed: CARDIOVERSION (N/A )  Patient Location: spu  Anesthesia Type:General  Level of Consciousness: awake  Airway & Oxygen Therapy: Patient Spontanous Breathing and Patient connected to nasal cannula oxygen  Post-op Assessment: Report given to RN and Post -op Vital signs reviewed and stable  Post vital signs: Reviewed  Last Vitals:  Vitals Value Taken Time  BP 83/62 05/24/20 0804  Temp 36.7 C 05/24/20 0804  Pulse 66 05/24/20 0804  Resp 15 05/24/20 0804  SpO2 98 % 05/24/20 0804    Last Pain:  Vitals:   05/24/20 0722  TempSrc: Oral         Complications: No complications documented.

## 2020-05-24 NOTE — Anesthesia Preprocedure Evaluation (Addendum)
Anesthesia Evaluation  Patient identified by MRN, date of birth, ID band Patient awake    Reviewed: Allergy & Precautions, NPO status , Patient's Chart, lab work & pertinent test results  History of Anesthesia Complications Negative for: history of anesthetic complications  Airway Mallampati: II  TM Distance: >3 FB Neck ROM: Full    Dental no notable dental hx.    Pulmonary neg sleep apnea, neg COPD, Patient abstained from smoking., former smoker,    breath sounds clear to auscultation- rhonchi (-) wheezing      Cardiovascular hypertension, Pt. on medications +CHF  (-) CAD, (-) Past MI, (-) Cardiac Stents and (-) CABG + dysrhythmias Atrial Fibrillation  Rhythm:Regular Rate:Normal - Systolic murmurs and - Diastolic murmurs    Neuro/Psych neg Seizures negative neurological ROS  negative psych ROS   GI/Hepatic negative GI ROS, Neg liver ROS,   Endo/Other  negative endocrine ROSneg diabetes  Renal/GU CRFRenal disease     Musculoskeletal negative musculoskeletal ROS (+)   Abdominal (+) - obese,   Peds  Hematology negative hematology ROS (+)   Anesthesia Other Findings Past Medical History: No date: CHF (congestive heart failure) ([facility]) No date: COVID-19 No date: H/O blood clots No date: Heart murmur No date: Hypertension   Reproductive/Obstetrics                             Anesthesia Physical Anesthesia Plan  ASA: III  Anesthesia Plan: General   Post-op Pain Management:    Induction: Intravenous  PONV Risk Score and Plan: 2 and Propofol infusion  Airway Management Planned: Natural Airway  Additional Equipment:   Intra-op Plan:   Post-operative Plan:   Informed Consent: I have reviewed the patients History and Physical, chart, labs and discussed the procedure including the risks, benefits and alternatives for the proposed anesthesia with the patient or authorized  representative who has indicated his/her understanding and acceptance.     Dental advisory given  Plan Discussed with: CRNA and Anesthesiologist  Anesthesia Plan Comments:         Anesthesia Quick Evaluation

## 2020-05-24 NOTE — Addendum Note (Signed)
Addended by: Kavin Leech on: 05/24/2020 09:11 AM   Modules accepted: Orders

## 2020-05-24 NOTE — Procedures (Signed)
Cardioversion procedure note For atrial fibrillation.  Procedure Details:  Consent: Risks of procedure as well as the alternatives and risks of each were explained to the (patient/caregiver).  Consent for procedure obtained.  Time Out: Verified patient identification, verified procedure, site/side was marked, verified correct patient position, special equipment/implants available, medications/allergies/relevent history reviewed, required imaging and test results available.  Performed  Patient placed on cardiac monitor, pulse oximetry, supplemental oxygen as necessary.   Sedation given: propofol IV,per anesthesia team  Pacer pads placed anterior and posterior chest.   Cardioverted 1 time(s).   Cardioverted at  [place]. Synchronized biphasic Converted to NSR   Evaluation: Findings: Post procedure EKG shows: NSR Complications: None Patient did tolerate procedure well.  Time Spent Directly with the Patient:  35 minutes   Kate Sable, M.D.

## 2020-05-31 NOTE — Progress Notes (Signed)
ADVANCED HF CLINIC CONSULT NOTE  Referring Physician: Kate Sable, MD Primary Care: Marguerita Merles, MD Primary Cardiologist: Kate Sable, MD   HPI  Paula Klein is a 75 y.o. female with a hx of NICM EF 25%, persistent A. fib, hypertension, DVT on Xarelto, COPD, former smoker (3ppd) who is referred by Dr. [name] for further evaluation of her HF.   Developed COVID 10/21 c/b DVT. Was hospitalized for nearly 2 weeks.   Seen in Cardiology clinic in 12/21 had rapid AF and echo showed EF 20-25%  Diagnosed with HF in 12/21 Echo with EF 20-25% moderate MR. LHC 1/22 mild nonobstructive CAD, proximal LAD 20%, proximal RCA 30%.  Underwent DC-CV on 05/24/20. Amio started.   Here with her husband. Feels good. Able to do ADLs and go to store (holds onto buggy). No CP or SOB. No edema, orthopnea or PND. No bleeding with Xarelto.   We did bedside echo today in clinic EF ~40%    Review of Systems: [y] = yes, [ ]  = no   General: Weight gain [ ] ; Weight loss [ ] ; Anorexia [ ] ; Fatigue [ ] ; Fever [ ] ; Chills [ ] ; Weakness [ ]   Cardiac: Chest pain/pressure [ ] ; Resting SOB [ ] ; Exertional SOB [ ] ; Orthopnea [ ] ; Pedal Edema [ ] ; Palpitations [ ] ; Syncope [ ] ; Presyncope [ ] ; Paroxysmal nocturnal dyspnea[ ]   Pulmonary: Cough [ ] ; Wheezing[ ] ; Hemoptysis[ ] ; Sputum [ ] ; Snoring [ ]   GI: Vomiting[ ] ; Dysphagia[ ] ; Melena[ ] ; Hematochezia [ ] ; Heartburn[ ] ; Abdominal pain [ ] ; Constipation [ ] ; Diarrhea [ ] ; BRBPR [ ]   GU: Hematuria[ ] ; Dysuria [ ] ; Nocturia[ ]   Vascular: Pain in legs with walking [ ] ; Pain in feet with lying flat [ ] ; Non-healing sores [ ] ; Stroke [ ] ; TIA [ ] ; Slurred speech [ ] ;  Neuro: Headaches[ ] ; Vertigo[ ] ; Seizures[ ] ; Paresthesias[ ] ;Blurred vision [ ] ; Diplopia [ ] ; Vision changes [ ]   Ortho/Skin: Arthritis Blue.Reese ]; Joint pain Blue.Reese ]; Muscle pain [ ] ; Joint swelling [ ] ; Back Pain [ ] ; Rash [ ]   Psych: Depression[ ] ; Anxiety[ ]   Heme: Bleeding problems [ ] ;  Clotting disorders [ ] ; Anemia [ ]   Endocrine: Diabetes [ ] ; Thyroid dysfunction[ ]    Past Medical History:  Diagnosis Date  . CHF (congestive heart failure) ([place])   . COVID-19   . H/O blood clots   . Heart murmur   . Hypertension     Current Outpatient Medications  Medication Sig Dispense Refill  . albuterol (PROVENTIL HFA;VENTOLIN HFA) 108 (90 Base) MCG/ACT inhaler Inhale 2 puffs into the lungs every 6 (six) hours as needed for shortness of breath.    Marland Kitchen amiodarone (PACERONE) 200 MG tablet Take 200 mg by mouth daily.    Marland Kitchen amoxicillin (AMOXIL) 500 MG capsule Take 2,000 mg by mouth See admin instructions. Take 4 capsules (2000 mg) by mouth 1 hour prior to dental appointment    . Ascorbic Acid (VITAMIN C) 1000 MG tablet Take 1,000 mg by mouth every evening.    Marland Kitchen atorvastatin (LIPITOR) 40 MG tablet Take 1 tablet (40 mg total) by mouth daily. 30 tablet 5  . Cholecalciferol (VITAMIN D3) 50 MCG (2000 UT) TABS Take 2,000 Units by mouth every evening.    . clonazePAM (KLONOPIN) 0.5 MG tablet Take 0.5 mg by mouth at bedtime.    . Coenzyme Q10 (CO Q 10 PO) Take 1 tablet by mouth daily in  the afternoon.    . Cyanocobalamin (VITAMIN B 12) 500 MCG TABS Take 2 tablets by mouth daily in the afternoon.    Marland Kitchen estradiol (ESTRACE) 0.5 MG tablet Take 0.5 mg by mouth in the morning.    . fluticasone furoate-vilanterol (BREO ELLIPTA) 100-25 MCG/INH AEPB Inhale 1 puff into the lungs daily. 60 each 5  . magnesium oxide (MAG-OX) 400 MG tablet Take 400 mg by mouth at bedtime.    . medroxyPROGESTERone (PROVERA) 2.5 MG tablet Take 2.5 mg by mouth in the morning.    . metoprolol succinate (TOPROL-XL) 100 MG 24 hr tablet Take 0.5 tablets (50 mg total) by mouth daily. Take with or immediately following a meal. 90 tablet 1  . Multiple Vitamins-Minerals (PRESERVISION AREDS 2 PO) Take 1 tablet by mouth in the morning and at bedtime.    . pantoprazole (PROTONIX) 40 MG tablet Take 40 mg by mouth daily as needed (acid  reflux.).    Marland Kitchen polyethylene glycol (MIRALAX / GLYCOLAX) 17 g packet Take 17 g by mouth in the morning.    . potassium chloride (KLOR-CON) 10 MEQ tablet Take 10 mEq by mouth daily. In the evening    . potassium chloride SA (KLOR-CON) 20 MEQ tablet Take 1 tablet (20 mEq total) by mouth daily. 90 tablet 3  . quiNINE (QUALAQUIN) 324 MG capsule Take 324 mg by mouth at bedtime as needed (cramping).    . rivaroxaban (XARELTO) 10 MG TABS tablet Take 1 tablet (10 mg total) by mouth in the morning and at bedtime. 180 tablet 3  . sacubitril-valsartan (ENTRESTO) 49-51 MG Take 1 tablet by mouth 2 (two) times daily. 180 tablet 3  . senna-docusate (SENOKOT-S) 8.6-50 MG tablet Take 1 tablet by mouth every evening.    . torsemide (DEMADEX) 20 MG tablet Take 20 mg by mouth daily. 2 tablets in afternoon     No current facility-administered medications for this encounter.    Allergies  Allergen Reactions  . Pine Tar Cough  . Sulfa Antibiotics Rash  . Tetracyclines & Related Rash      Social History   Socioeconomic History  . Marital status: Widowed    Spouse name: Not on file  . Number of children: Not on file  . Years of education: Not on file  . Highest education level: Not on file  Occupational History  . Not on file  Tobacco Use  . Smoking status: Former Smoker    Packs/day: 3.00    Years: 30.00    Pack years: 90.00    Quit date: 02/03/1989    Years since quitting: 31.3  . Smokeless tobacco: Never Used  Vaping Use  . Vaping Use: Never used  Substance and Sexual Activity  . Alcohol use: Yes    Comment: rarely  . Drug use: No  . Sexual activity: Not on file  Other Topics Concern  . Not on file  Social History Narrative  . Not on file   Social Determinants of Health   Financial Resource Strain: Not on file  Food Insecurity: Not on file  Transportation Needs: Not on file  Physical Activity: Not on file  Stress: Not on file  Social Connections: Not on file  Intimate Partner  Violence: Not on file      Family History  Problem Relation Age of Onset  . Varicose Veins Mother   . AAA (abdominal aortic aneurysm) Mother   . Heart attack Father   . Cancer Brother     Vitals:  06/01/20 1433  BP: (!) 94/50  Pulse: (!) 103  SpO2: 96%  Weight: 68.2 kg (150 lb 6.4 oz)   Wt Readings from Last 3 Encounters:  06/01/20 68.2 kg (150 lb 6.4 oz)  05/24/20 65.8 kg (145 lb)  05/18/20 65.7 kg (144 lb 12.8 oz)     PHYSICAL EXAM: General:  Well appearing. No respiratory difficulty HEENT: normal Neck: supple. no JVD. Carotids 2+ bilat; no bruits. No lymphadenopathy or thryomegaly appreciated. Cor: PMI nondisplaced. Irregular rate & rhythm. No rubs, gallops or murmurs. Lungs: clear Abdomen: soft, nontender, nondistended. No hepatosplenomegaly. No bruits or masses. Good bowel sounds. Extremities: no cyanosis, clubbing, rash, edema Neuro: alert & oriented x 3, cranial nerves grossly intact. moves all 4 extremities w/o difficulty. Affect pleasant.  ECG: AF 107 non-specific TW abnormality Personally reviewed   ASSESSMENT & PLAN:  1. 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 - LHC 1/22 very mild CAD - bedside echo today in clinic EF ~40% - NYHA II - Volume status ok on torsemide 20/40  - Continue Entresto to 49/51 bid - Continue Toprol-XL 100 mg daily  - Would like to add spiro but BP is too low and I don't want to start before she leaves for [place] next week.  - Consider SGLT2i - If EF doesn't completely improve with restoration of NSR/AF rate control will need cMRI  2. Nonobstructive CAD - LHC 1/22  RCA 30%, proximal to mid LAD 20% - Continue atrova  40 mg daily, Xarelto.   3. Persistent atrial fibrillation - CHA2DS2-VASc of 4(chf, htn, gender, age).   - s/p DC-CV 05/24/20 - Back in AF with RVR - Continue amio 200 bid for now. Repeat DC-CV with Agbor-Etang in mid-April  - No bleeding on Xarelto   4.  COPD - has quit smoking   Glori Bickers, MD  2:57 PM

## 2020-06-01 ENCOUNTER — Encounter (HOSPITAL_COMMUNITY): Payer: Self-pay | Admitting: Internal Medicine

## 2020-06-01 ENCOUNTER — Other Ambulatory Visit: Payer: Self-pay

## 2020-06-01 ENCOUNTER — Ambulatory Visit (HOSPITAL_COMMUNITY)
Admission: RE | Admit: 2020-06-01 | Discharge: 2020-06-01 | Disposition: A | Discharge status: Home / Self Care | Payer: Medicare Other | Source: Ambulatory Visit | Attending: Internal Medicine | Admitting: Internal Medicine

## 2020-06-01 VITALS — BP 94/50 | HR 103 | Wt 150.4 lb

## 2020-06-01 DIAGNOSIS — J449 Chronic obstructive pulmonary disease, unspecified: Secondary | ICD-10-CM | POA: Diagnosis not present

## 2020-06-01 DIAGNOSIS — Z8249 Family history of ischemic heart disease and other diseases of the circulatory system: Secondary | ICD-10-CM | POA: Insufficient documentation

## 2020-06-01 DIAGNOSIS — I2510 Atherosclerotic heart disease of native coronary artery without angina pectoris: Secondary | ICD-10-CM | POA: Diagnosis not present

## 2020-06-01 DIAGNOSIS — Z7951 Long term (current) use of inhaled steroids: Secondary | ICD-10-CM | POA: Diagnosis not present

## 2020-06-01 DIAGNOSIS — I5022 Chronic systolic (congestive) heart failure: Secondary | ICD-10-CM | POA: Diagnosis not present

## 2020-06-01 DIAGNOSIS — Z7901 Long term (current) use of anticoagulants: Secondary | ICD-10-CM | POA: Insufficient documentation

## 2020-06-01 DIAGNOSIS — I428 Other cardiomyopathies: Secondary | ICD-10-CM | POA: Insufficient documentation

## 2020-06-01 DIAGNOSIS — Z8616 Personal history of COVID-19: Secondary | ICD-10-CM | POA: Insufficient documentation

## 2020-06-01 DIAGNOSIS — I252 Old myocardial infarction: Secondary | ICD-10-CM | POA: Diagnosis not present

## 2020-06-01 DIAGNOSIS — I4891 Unspecified atrial fibrillation: Secondary | ICD-10-CM | POA: Diagnosis not present

## 2020-06-01 DIAGNOSIS — I110 Hypertensive heart disease with heart failure: Secondary | ICD-10-CM | POA: Insufficient documentation

## 2020-06-01 DIAGNOSIS — Z79899 Other long term (current) drug therapy: Secondary | ICD-10-CM | POA: Insufficient documentation

## 2020-06-01 DIAGNOSIS — Z86718 Personal history of other venous thrombosis and embolism: Secondary | ICD-10-CM | POA: Diagnosis not present

## 2020-06-01 DIAGNOSIS — Z87891 Personal history of nicotine dependence: Secondary | ICD-10-CM | POA: Diagnosis not present

## 2020-06-01 DIAGNOSIS — I4819 Other persistent atrial fibrillation: Secondary | ICD-10-CM | POA: Diagnosis not present

## 2020-06-01 NOTE — Patient Instructions (Signed)
Continue Amiodarone 200 mg Daily  Your physician recommends that you schedule a follow-up appointment in: 3 months  If you have any questions or concerns before your next appointment please send [place] a message through [place] or call our office at [phone].    TO LEAVE A MESSAGE FOR THE NURSE SELECT OPTION 2, PLEASE LEAVE A MESSAGE INCLUDING: . YOUR NAME . DATE OF BIRTH . CALL BACK NUMBER . REASON FOR CALL**this is important as we prioritize the call backs  [place] AS LONG AS YOU CALL BEFORE 4:00 PM  At the [facility], you and your health needs are our priority. As part of our continuing mission to provide you with exceptional heart care, we have created designated Provider Care Teams. These Care Teams include your primary Cardiologist (physician) and Advanced Practice Providers (APPs- Physician Assistants and Nurse Practitioners) who all work together to provide you with the care you need, when you need it.   You may see any of the following providers on your designated Care Team at your next follow up: Marland Kitchen Dr Glori Bickers . Dr Loralie Champagne . Dr Vickki Muff . Darrick Grinder, NP . Lyda Jester, [place] . Audry Riles, PharmD   Please be sure to bring in all your medications bottles to every appointment.

## 2020-06-19 ENCOUNTER — Other Ambulatory Visit: Payer: Self-pay

## 2020-06-19 NOTE — Telephone Encounter (Signed)
[place] sent a fax requesting refills of amiodarone stating "patient says she was told to take 2 tablets every morning and 1 tablet every evening-Please advise". After reviewing her chart and Dr. Clayborne Dana last office note, Dr. [name] recommends that patient is only take 1 tablet of amiodarone 200mg  BID. Spoke with patient's fiance Shanon Brow who states he manages her medications for her and she is taking 200mg  BID. He states there was some confusion at the pharmacy and Elivia was referring to a different medication, not amiodarone. He states she does not need refills of amio at this time. F/U appointment with Dr. [name] scheduled for 06/22/2020. Pharmacy has been informed.

## 2020-06-22 ENCOUNTER — Encounter: Payer: Self-pay | Admitting: Cardiology

## 2020-06-22 ENCOUNTER — Ambulatory Visit (INDEPENDENT_AMBULATORY_CARE_PROVIDER_SITE_OTHER): Payer: Medicare Other | Admitting: Cardiology

## 2020-06-22 ENCOUNTER — Other Ambulatory Visit: Payer: Self-pay

## 2020-06-22 VITALS — BP 110/62 | HR 105 | Ht 62.0 in | Wt 150.0 lb

## 2020-06-22 DIAGNOSIS — I4891 Unspecified atrial fibrillation: Secondary | ICD-10-CM

## 2020-06-22 DIAGNOSIS — I2510 Atherosclerotic heart disease of native coronary artery without angina pectoris: Secondary | ICD-10-CM | POA: Diagnosis not present

## 2020-06-22 DIAGNOSIS — I428 Other cardiomyopathies: Secondary | ICD-10-CM

## 2020-06-22 MED ORDER — METOPROLOL SUCCINATE ER 100 MG PO TB24: 100.0000 mg | ORAL_TABLET | Freq: Every day | ORAL | 3 refills | Status: DC

## 2020-06-22 MED ORDER — TORSEMIDE 20 MG PO TABS: ORAL_TABLET | ORAL | 3 refills | Status: AC

## 2020-06-22 MED ORDER — AMIODARONE HCL 200 MG PO TABS: 200.0000 mg | ORAL_TABLET | Freq: Two times a day (BID) | ORAL | 3 refills | Status: AC

## 2020-06-22 NOTE — Patient Instructions (Addendum)
Medication Instructions:  Your physician recommends that you continue on your current medications as directed. Please refer to the Current Medication list given to you today. *If you need a refill on your cardiac medications before your next appointment, please call your pharmacy*   Lab Work:  Cbc, bmp drawn today  COVID PRE- TEST: You will need a COVID TEST prior to the procedure:             LOCATION: [facility] [address] [address]Thru Testing site.             DATE/TIME:  Monday 06/26/20 (anytime between 8 am and 1 pm)   Testing/Procedures:  You are scheduled for a Cardioversion on ___Wed. 4/27/22_____________ with Dr.__Agbor-Etang_________ Please arrive at the [place] [facility] at __1200_pm on the day of your procedure.  DIET INSTRUCTIONS:  Nothing to eat or drink after midnight except your medications with a sip of water.         1) Labs: ___drawn in office_______________  2) Medications:  YOU MAY TAKE ALL of your remaining medications with a small amount of water.  3) Must have a responsible person to drive you home.  4) Bring a current list of your medications and current insurance cards.    If you have any questions after you get home, please call the office at [phone]   Follow-Up: At [facility], you and your health needs are our priority.  As part of our continuing mission to provide you with exceptional heart care, we have created designated Provider Care Teams.  These Care Teams include your primary Cardiologist (physician) and Advanced Practice Providers (APPs -  Physician Assistants and Nurse Practitioners) who all work together to provide you with the care you need, when you need it.  We recommend signing up for the patient portal called "MyChart".  Sign up information is provided on this After Visit Summary.  MyChart is used to connect with patients for Virtual Visits (Telemedicine).  Patients are able to view lab/test results, encounter notes,  upcoming appointments, etc.  Non-urgent messages can be sent to your provider as well.   To learn more about what you can do with MyChart, go to [email or web address].    Your next appointment:   2-3 weeks   The format for your next appointment:   In Person  Provider:   Kate Sable, MD   Other Instructions

## 2020-06-22 NOTE — H&P (View-Only) (Signed)
Cardiology Office Note:    Date:  06/22/2020   ID:  Paula Klein, DOB 1946-01-13, MRN [id number]  PCP:  Marguerita Merles, MD  [facility] Cardiologist:  Kate Sable, MD  [place] Electrophysiologist:  None   Referring MD: Marguerita Merles, MD   Chief Complaint  Patient presents with  . Other    1 month follow up. Meds reviewed verbally with patient.     History of Present Illness:    Paula Klein is a 75 y.o. female with a hx of NICM EF 25%, persistent A. fib, hypertension, DVT on Xarelto, COPD, former smoker who presents for follow-up.    Patient being seen for heart failure and atrial fibrillation.  Underwent DC cardioversion 05/24/2020 successfully, but patient returned to A. fib.  Amiodarone was started and she currently takes 200 mg twice daily.  Entresto was increased to 49/51 mg twice daily.  Breathing and edema okay on torsemide 60 mg daily.  Prior notes Echocardiogram 02/2020 severely reduced ejection fraction, EF 20 to 25%.  Moderately dilated LA, moderately dilated RA.  Moderate MR. Left heart cath 03/2020 mild nonobstructive CAD, proximal LAD 20%, proximal RCA 30%.  Past Medical History:  Diagnosis Date  . CHF (congestive heart failure) ([place])   . COVID-19   . H/O blood clots   . Heart murmur   . Hypertension     Past Surgical History:  Procedure Laterality Date  . CARDIOVERSION N/A 05/24/2020   Procedure: CARDIOVERSION;  Surgeon: Kate Sable, MD;  Location: [facility] ORS;  Service: Cardiovascular;  Laterality: N/A;  . COLONOSCOPY WITH PROPOFOL N/A 09/26/2015   Procedure: COLONOSCOPY WITH PROPOFOL;  Surgeon: Lollie Sails, MD;  Location: [facility] ENDOSCOPY;  Service: Endoscopy;  Laterality: N/A;  . NO PAST SURGERIES    . RIGHT/LEFT HEART CATH AND CORONARY ANGIOGRAPHY N/A 03/24/2020   Procedure: RIGHT/LEFT HEART CATH AND CORONARY ANGIOGRAPHY;  Surgeon: Wellington Hampshire, MD;  Location: [facility] CV LAB;  Service: Cardiovascular;  Laterality: N/A;     Current Medications: Current Meds  Medication Sig  . albuterol (PROVENTIL HFA;VENTOLIN HFA) 108 (90 Base) MCG/ACT inhaler Inhale 2 puffs into the lungs every 6 (six) hours as needed for shortness of breath.  Marland Kitchen amoxicillin (AMOXIL) 500 MG capsule Take 2,000 mg by mouth See admin instructions. Take 4 capsules (2000 mg) by mouth 1 hour prior to dental appointment  . Ascorbic Acid (VITAMIN C) 1000 MG tablet Take 1,000 mg by mouth every evening.  Marland Kitchen atorvastatin (LIPITOR) 40 MG tablet Take 1 tablet (40 mg total) by mouth daily.  . Cholecalciferol (VITAMIN D3) 50 MCG (2000 UT) TABS Take 2,000 Units by mouth every evening.  . clonazePAM (KLONOPIN) 0.5 MG tablet Take 0.5 mg by mouth at bedtime.  . Coenzyme Q10 (CO Q 10 PO) Take 1 tablet by mouth daily in the afternoon.  . Cyanocobalamin (VITAMIN B 12) 500 MCG TABS Take 2 tablets by mouth daily in the afternoon.  Marland Kitchen estradiol (ESTRACE) 0.5 MG tablet Take 0.5 mg by mouth in the morning.  . fluticasone furoate-vilanterol (BREO ELLIPTA) 100-25 MCG/INH AEPB Inhale 1 puff into the lungs daily.  . magnesium oxide (MAG-OX) 400 MG tablet Take 400 mg by mouth at bedtime.  . medroxyPROGESTERone (PROVERA) 2.5 MG tablet Take 2.5 mg by mouth in the morning.  . Multiple Vitamins-Minerals (PRESERVISION AREDS 2 PO) Take 1 tablet by mouth in the morning and at bedtime.  . pantoprazole (PROTONIX) 40 MG tablet Take 40 mg by mouth  daily as needed (acid reflux.).  Marland Kitchen polyethylene glycol (MIRALAX / GLYCOLAX) 17 g packet Take 17 g by mouth in the morning.  . potassium chloride (KLOR-CON) 10 MEQ tablet Take 10 mEq by mouth daily. In the evening  . potassium chloride SA (KLOR-CON) 20 MEQ tablet Take 1 tablet (20 mEq total) by mouth daily.  . quiNINE (QUALAQUIN) 324 MG capsule Take 324 mg by mouth at bedtime as needed (cramping).  . rivaroxaban (XARELTO) 10 MG TABS tablet Take 1 tablet (10 mg total) by mouth in the morning and at bedtime.  . sacubitril-valsartan (ENTRESTO)  49-51 MG Take 1 tablet by mouth 2 (two) times daily.  Marland Kitchen senna-docusate (SENOKOT-S) 8.6-50 MG tablet Take 1 tablet by mouth every evening.  . [DISCONTINUED] amiodarone (PACERONE) 200 MG tablet Take 200 mg by mouth in the morning and at bedtime.  . [DISCONTINUED] metoprolol succinate (TOPROL-XL) 100 MG 24 hr tablet Take 0.5 tablets (50 mg total) by mouth daily. Take with or immediately following a meal.  . [DISCONTINUED] torsemide (DEMADEX) 20 MG tablet Take one tablet (20mg ) in the AM and 2 tablets (40MG ) in the PM     Allergies:   Pine tar, Sulfa antibiotics, and Tetracyclines & related   Social History   Socioeconomic History  . Marital status: Widowed    Spouse name: Not on file  . Number of children: Not on file  . Years of education: Not on file  . Highest education level: Not on file  Occupational History  . Not on file  Tobacco Use  . Smoking status: Former Smoker    Packs/day: 3.00    Years: 30.00    Pack years: 90.00    Quit date: 02/03/1989    Years since quitting: 31.4  . Smokeless tobacco: Never Used  Vaping Use  . Vaping Use: Never used  Substance and Sexual Activity  . Alcohol use: Yes    Comment: rarely  . Drug use: No  . Sexual activity: Not on file  Other Topics Concern  . Not on file  Social History Narrative  . Not on file   Social Determinants of Health   Financial Resource Strain: Not on file  Food Insecurity: Not on file  Transportation Needs: Not on file  Physical Activity: Not on file  Stress: Not on file  Social Connections: Not on file     Family History: The patient's family history includes AAA (abdominal aortic aneurysm) in her mother; Cancer in her brother; Heart attack in her father; Varicose Veins in her mother.  ROS:   Please see the history of present illness.     All other systems reviewed and are negative.  EKGs/Labs/Other Studies Reviewed:    The following studies were reviewed today:   EKG:  EKG is  ordered today.  The  ekg ordered today demonstrates atrial fibrillation, heart rate 105  Recent Labs: 02/26/2020: B Natriuretic Peptide 1,132.2 03/02/2020: ALT 303; Magnesium 1.8 05/22/2020: BUN 20; Creatinine, Ser 1.16; Hemoglobin 11.6; Platelets 530; Potassium 3.7; Sodium 139  Recent Lipid Panel No results found for: CHOL, TRIG, HDL, CHOLHDL, VLDL, LDLCALC, LDLDIRECT   Risk Assessment/Calculations:      Physical Exam:    VS:  BP 110/62 (BP Location: Left Arm, Patient Position: Sitting, Cuff Size: Normal)   Pulse (!) 105   Ht 5\' 2"  (1.575 m)   Wt 150 lb (68 kg)   SpO2 92%   BMI 27.44 kg/m     Wt Readings from Last 3 Encounters:  06/22/20 150 lb (68 kg)  06/01/20 150 lb 6.4 oz (68.2 kg)  05/24/20 145 lb (65.8 kg)     GEN:  Well nourished, well developed in no acute distress HEENT: Normal NECK: No JVD; No carotid bruits LYMPHATICS: No lymphadenopathy CARDIAC:  irregular irregular RESPIRATORY: Decreased breath sounds at bases ABDOMEN: Soft, non-tender, distended MUSCULOSKELETAL:trace  edema; worse on left, varicose veins noted no deformity  SKIN: Warm and dry NEUROLOGIC:  Alert and oriented x 3 PSYCHIATRIC:  Normal affect   ASSESSMENT:    1. NICM (nonischemic cardiomyopathy) ([place])   2. Coronary artery disease involving native coronary artery of native heart without angina pectoris   3. Atrial fibrillation, unspecified type ([place])    PLAN:    In order of problems listed above:  1. Nonischemic cardiomyopathy,EF 20 to 25%.  Describes NYHA class II symptoms.  cont Entresto 49/51 mg twice daily.  Continue Toprol-XL 100 mg daily, torsemide 60.  Plan for DC cardioversion, cardiomyopathy could be tachycardia mediated. 2. Nonobstructive CAD, LHC on 03/2020 , RCA 30%, proximal to mid LAD 20%.  cont Lipitor 40 mg daily, Xarelto.  3. Persistent atrial fibrillation, heart rate 105..  CHA2DS2-VASc of 4(chf, htn, gender, age).  Continue Toprol-XL, Xarelto..  Plan for repeat DC cardioversion next week.   Continue amiodarone 200 mg twice daily.  Follow-up in 2-3 weeks    Shared Decision Making/Informed Consent The risks (stroke, cardiac arrhythmias rarely resulting in the need for a temporary or permanent pacemaker, skin irritation or burns and complications associated with conscious sedation including aspiration, arrhythmia, respiratory failure and death), benefits (restoration of normal sinus rhythm) and alternatives of a direct current cardioversion were explained in detail to Ms. Degroat and she agrees to proceed.    Medication Adjustments/Labs and Tests Ordered: Current medicines are reviewed at length with the patient today.  Concerns regarding medicines are outlined above.  Orders Placed This Encounter  Procedures  . CBC  . Basic metabolic panel  . EKG 12-Lead   Meds ordered this encounter  Medications  . metoprolol succinate (TOPROL-XL) 100 MG 24 hr tablet    Sig: Take 1 tablet (100 mg total) by mouth daily. Take with or immediately following a meal.    Dispense:  90 tablet    Refill:  3  . torsemide (DEMADEX) 20 MG tablet    Sig: Take one tablet (20mg ) in the AM and 2 tablets (40MG ) in the PM    Dispense:  90 tablet    Refill:  3  . amiodarone (PACERONE) 200 MG tablet    Sig: Take 1 tablet (200 mg total) by mouth 2 (two) times daily.    Dispense:  180 tablet    Refill:  3    Patient Instructions  Medication Instructions:  Your physician recommends that you continue on your current medications as directed. Please refer to the Current Medication list given to you today. *If you need a refill on your cardiac medications before your next appointment, please call your pharmacy*   Lab Work:  Cbc, bmp drawn today  COVID PRE- TEST: You will need a COVID TEST prior to the procedure:             LOCATION: [facility] [address] [address]-Thru Testing site.             DATE/TIME:  Monday 06/26/20 (anytime between 8 am and 1 pm)   Testing/Procedures:  You are  scheduled for a Cardioversion on ___Wed. 4/27/22_____________ with Dr.__Agbor-Etang_________ Please arrive  at the [place] [facility] at __1200_pm on the day of your procedure.  DIET INSTRUCTIONS:  Nothing to eat or drink after midnight except your medications with a sip of water.         1) Labs: ___drawn in office_______________  2) Medications:  YOU MAY TAKE ALL of your remaining medications with a small amount of water.  3) Must have a responsible person to drive you home.  4) Bring a current list of your medications and current insurance cards.    If you have any questions after you get home, please call the office at [phone]   Follow-Up: At [facility], you and your health needs are our priority.  As part of our continuing mission to provide you with exceptional heart care, we have created designated Provider Care Teams.  These Care Teams include your primary Cardiologist (physician) and Advanced Practice Providers (APPs -  Physician Assistants and Nurse Practitioners) who all work together to provide you with the care you need, when you need it.  We recommend signing up for the patient portal called "MyChart".  Sign up information is provided on this After Visit Summary.  MyChart is used to connect with patients for Virtual Visits (Telemedicine).  Patients are able to view lab/test results, encounter notes, upcoming appointments, etc.  Non-urgent messages can be sent to your provider as well.   To learn more about what you can do with MyChart, go to [email or web address].    Your next appointment:   2-3 weeks   The format for your next appointment:   In Person  Provider:   Kate Sable, MD   Other Instructions      Signed, Kate Sable, MD  06/22/2020 2:34 PM    [place]

## 2020-06-22 NOTE — Progress Notes (Signed)
Cardiology Office Note:    Date:  06/22/2020   ID:  Paula Klein, DOB [birth year], MRN [id number]  PCP:  Marguerita Merles, MD  [facility] Cardiologist:  Kate Sable, MD  [place] Electrophysiologist:  None   Referring MD: Marguerita Merles, MD   Chief Complaint  Patient presents with  . Other    1 month follow up. Meds reviewed verbally with patient.     History of Present Illness:    Paula Klein is a 75 y.o. female with a hx of NICM EF 25%, persistent A. fib, hypertension, DVT on Xarelto, COPD, former smoker who presents for follow-up.    Patient being seen for heart failure and atrial fibrillation.  Underwent DC cardioversion 05/24/2020 successfully, but patient returned to A. fib.  Amiodarone was started and she currently takes 200 mg twice daily.  Entresto was increased to 49/51 mg twice daily.  Breathing and edema okay on torsemide 60 mg daily.  Prior notes Echocardiogram 02/2020 severely reduced ejection fraction, EF 20 to 25%.  Moderately dilated LA, moderately dilated RA.  Moderate MR. Left heart cath 03/2020 mild nonobstructive CAD, proximal LAD 20%, proximal RCA 30%.  Past Medical History:  Diagnosis Date  . CHF (congestive heart failure) ([place])   . COVID-19   . H/O blood clots   . Heart murmur   . Hypertension     Past Surgical History:  Procedure Laterality Date  . CARDIOVERSION N/A 05/24/2020   Procedure: CARDIOVERSION;  Surgeon: Kate Sable, MD;  Location: [facility] ORS;  Service: Cardiovascular;  Laterality: N/A;  . COLONOSCOPY WITH PROPOFOL N/A 09/26/2015   Procedure: COLONOSCOPY WITH PROPOFOL;  Surgeon: Lollie Sails, MD;  Location: [facility] ENDOSCOPY;  Service: Endoscopy;  Laterality: N/A;  . NO PAST SURGERIES    . RIGHT/LEFT HEART CATH AND CORONARY ANGIOGRAPHY N/A 03/24/2020   Procedure: RIGHT/LEFT HEART CATH AND CORONARY ANGIOGRAPHY;  Surgeon: Wellington Hampshire, MD;  Location: [facility] CV LAB;  Service: Cardiovascular;  Laterality: N/A;     Current Medications: Current Meds  Medication Sig  . albuterol (PROVENTIL HFA;VENTOLIN HFA) 108 (90 Base) MCG/ACT inhaler Inhale 2 puffs into the lungs every 6 (six) hours as needed for shortness of breath.  Marland Kitchen amoxicillin (AMOXIL) 500 MG capsule Take 2,000 mg by mouth See admin instructions. Take 4 capsules (2000 mg) by mouth 1 hour prior to dental appointment  . Ascorbic Acid (VITAMIN C) 1000 MG tablet Take 1,000 mg by mouth every evening.  Marland Kitchen atorvastatin (LIPITOR) 40 MG tablet Take 1 tablet (40 mg total) by mouth daily.  . Cholecalciferol (VITAMIN D3) 50 MCG (2000 UT) TABS Take 2,000 Units by mouth every evening.  . clonazePAM (KLONOPIN) 0.5 MG tablet Take 0.5 mg by mouth at bedtime.  . Coenzyme Q10 (CO Q 10 PO) Take 1 tablet by mouth daily in the afternoon.  . Cyanocobalamin (VITAMIN B 12) 500 MCG TABS Take 2 tablets by mouth daily in the afternoon.  Marland Kitchen estradiol (ESTRACE) 0.5 MG tablet Take 0.5 mg by mouth in the morning.  . fluticasone furoate-vilanterol (BREO ELLIPTA) 100-25 MCG/INH AEPB Inhale 1 puff into the lungs daily.  . magnesium oxide (MAG-OX) 400 MG tablet Take 400 mg by mouth at bedtime.  . medroxyPROGESTERone (PROVERA) 2.5 MG tablet Take 2.5 mg by mouth in the morning.  . Multiple Vitamins-Minerals (PRESERVISION AREDS 2 PO) Take 1 tablet by mouth in the morning and at bedtime.  . pantoprazole (PROTONIX) 40 MG tablet Take 40 mg by mouth  daily as needed (acid reflux.).  Marland Kitchen polyethylene glycol (MIRALAX / GLYCOLAX) 17 g packet Take 17 g by mouth in the morning.  . potassium chloride (KLOR-CON) 10 MEQ tablet Take 10 mEq by mouth daily. In the evening  . potassium chloride SA (KLOR-CON) 20 MEQ tablet Take 1 tablet (20 mEq total) by mouth daily.  . quiNINE (QUALAQUIN) 324 MG capsule Take 324 mg by mouth at bedtime as needed (cramping).  . rivaroxaban (XARELTO) 10 MG TABS tablet Take 1 tablet (10 mg total) by mouth in the morning and at bedtime.  . sacubitril-valsartan (ENTRESTO)  49-51 MG Take 1 tablet by mouth 2 (two) times daily.  Marland Kitchen senna-docusate (SENOKOT-S) 8.6-50 MG tablet Take 1 tablet by mouth every evening.  . [DISCONTINUED] amiodarone (PACERONE) 200 MG tablet Take 200 mg by mouth in the morning and at bedtime.  . [DISCONTINUED] metoprolol succinate (TOPROL-XL) 100 MG 24 hr tablet Take 0.5 tablets (50 mg total) by mouth daily. Take with or immediately following a meal.  . [DISCONTINUED] torsemide (DEMADEX) 20 MG tablet Take one tablet (20mg ) in the AM and 2 tablets (40MG ) in the PM     Allergies:   Pine tar, Sulfa antibiotics, and Tetracyclines & related   Social History   Socioeconomic History  . Marital status: Widowed    Spouse name: Not on file  . Number of children: Not on file  . Years of education: Not on file  . Highest education level: Not on file  Occupational History  . Not on file  Tobacco Use  . Smoking status: Former Smoker    Packs/day: 3.00    Years: 30.00    Pack years: 90.00    Quit date: 02/03/1989    Years since quitting: 31.4  . Smokeless tobacco: Never Used  Vaping Use  . Vaping Use: Never used  Substance and Sexual Activity  . Alcohol use: Yes    Comment: rarely  . Drug use: No  . Sexual activity: Not on file  Other Topics Concern  . Not on file  Social History Narrative  . Not on file   Social Determinants of Health   Financial Resource Strain: Not on file  Food Insecurity: Not on file  Transportation Needs: Not on file  Physical Activity: Not on file  Stress: Not on file  Social Connections: Not on file     Family History: The patient's family history includes AAA (abdominal aortic aneurysm) in her mother; Cancer in her brother; Heart attack in her father; Varicose Veins in her mother.  ROS:   Please see the history of present illness.     All other systems reviewed and are negative.  EKGs/Labs/Other Studies Reviewed:    The following studies were reviewed today:   EKG:  EKG is  ordered today.  The  ekg ordered today demonstrates atrial fibrillation, heart rate 105  Recent Labs: 02/26/2020: B Natriuretic Peptide 1,132.2 03/02/2020: ALT 303; Magnesium 1.8 05/22/2020: BUN 20; Creatinine, Ser 1.16; Hemoglobin 11.6; Platelets 530; Potassium 3.7; Sodium 139  Recent Lipid Panel No results found for: CHOL, TRIG, HDL, CHOLHDL, VLDL, LDLCALC, LDLDIRECT   Risk Assessment/Calculations:      Physical Exam:    VS:  BP 110/62 (BP Location: Left Arm, Patient Position: Sitting, Cuff Size: Normal)   Pulse (!) 105   Ht 5\' 2"  (1.575 m)   Wt 150 lb (68 kg)   SpO2 92%   BMI 27.44 kg/m     Wt Readings from Last 3 Encounters:  06/22/20 150 lb (68 kg)  06/01/20 150 lb 6.4 oz (68.2 kg)  05/24/20 145 lb (65.8 kg)     GEN:  Well nourished, well developed in no acute distress HEENT: Normal NECK: No JVD; No carotid bruits LYMPHATICS: No lymphadenopathy CARDIAC:  irregular irregular RESPIRATORY: Decreased breath sounds at bases ABDOMEN: Soft, non-tender, distended MUSCULOSKELETAL:trace  edema; worse on left, varicose veins noted no deformity  SKIN: Warm and dry NEUROLOGIC:  Alert and oriented x 3 PSYCHIATRIC:  Normal affect   ASSESSMENT:    1. NICM (nonischemic cardiomyopathy) ([place])   2. Coronary artery disease involving native coronary artery of native heart without angina pectoris   3. Atrial fibrillation, unspecified type ([place])    PLAN:    In order of problems listed above:  1. Nonischemic cardiomyopathy,EF 20 to 25%.  Describes NYHA class II symptoms.  cont Entresto 49/51 mg twice daily.  Continue Toprol-XL 100 mg daily, torsemide 60.  Plan for DC cardioversion, cardiomyopathy could be tachycardia mediated. 2. Nonobstructive CAD, LHC on 03/2020 , RCA 30%, proximal to mid LAD 20%.  cont Lipitor 40 mg daily, Xarelto.  3. Persistent atrial fibrillation, heart rate 105..  CHA2DS2-VASc of 4(chf, htn, gender, age).  Continue Toprol-XL, Xarelto..  Plan for repeat DC cardioversion next week.   Continue amiodarone 200 mg twice daily.  Follow-up in 2-3 weeks    Shared Decision Making/Informed Consent The risks (stroke, cardiac arrhythmias rarely resulting in the need for a temporary or permanent pacemaker, skin irritation or burns and complications associated with conscious sedation including aspiration, arrhythmia, respiratory failure and death), benefits (restoration of normal sinus rhythm) and alternatives of a direct current cardioversion were explained in detail to Paula Klein and she agrees to proceed.    Medication Adjustments/Labs and Tests Ordered: Current medicines are reviewed at length with the patient today.  Concerns regarding medicines are outlined above.  Orders Placed This Encounter  Procedures  . CBC  . Basic metabolic panel  . EKG 12-Lead   Meds ordered this encounter  Medications  . metoprolol succinate (TOPROL-XL) 100 MG 24 hr tablet    Sig: Take 1 tablet (100 mg total) by mouth daily. Take with or immediately following a meal.    Dispense:  90 tablet    Refill:  3  . torsemide (DEMADEX) 20 MG tablet    Sig: Take one tablet (20mg ) in the AM and 2 tablets (40MG ) in the PM    Dispense:  90 tablet    Refill:  3  . amiodarone (PACERONE) 200 MG tablet    Sig: Take 1 tablet (200 mg total) by mouth 2 (two) times daily.    Dispense:  180 tablet    Refill:  3    Patient Instructions  Medication Instructions:  Your physician recommends that you continue on your current medications as directed. Please refer to the Current Medication list given to you today. *If you need a refill on your cardiac medications before your next appointment, please call your pharmacy*   Lab Work:  Cbc, bmp drawn today  COVID PRE- TEST: You will need a COVID TEST prior to the procedure:             LOCATION: [facility] site.             DATE/TIME:  Monday 06/26/20 (anytime between 8 am and 1 pm)   Testing/Procedures:  You are  scheduled for a Cardioversion on ___Wed. 4/27/22_____________ with Dr.__Agbor-Etang_________ Please arrive  at the [facility] at __1200_pm on the day of your procedure.  DIET INSTRUCTIONS:  Nothing to eat or drink after midnight except your medications with a sip of water.         1) Labs: ___drawn in office_______________  2) Medications:  YOU MAY TAKE ALL of your remaining medications with a small amount of water.  3) Must have a responsible person to drive you home.  4) Bring a current list of your medications and current insurance cards.    If you have any questions after you get home, please call the office at [phone]   Follow-Up: At [facility], you and your health needs are our priority.  As part of our continuing mission to provide you with exceptional heart care, we have created designated Provider Care Teams.  These Care Teams include your primary Cardiologist (physician) and Advanced Practice Providers (APPs -  Physician Assistants and Nurse Practitioners) who all work together to provide you with the care you need, when you need it.  We recommend signing up for the patient portal called "MyChart".  Sign up information is provided on this After Visit Summary.  MyChart is used to connect with patients for Virtual Visits (Telemedicine).  Patients are able to view lab/test results, encounter notes, upcoming appointments, etc.  Non-urgent messages can be sent to your provider as well.   To learn more about what you can do with MyChart, go to [email or web address].    Your next appointment:   2-3 weeks   The format for your next appointment:   In Person  Provider:   Kate Sable, MD   Other Instructions      Signed, Kate Sable, MD  06/22/2020 2:34 PM    [place]

## 2020-06-23 ENCOUNTER — Telehealth: Payer: Self-pay | Admitting: Cardiology

## 2020-06-23 ENCOUNTER — Telehealth (INDEPENDENT_AMBULATORY_CARE_PROVIDER_SITE_OTHER): Payer: Self-pay | Admitting: Nurse Practitioner

## 2020-06-23 LAB — BASIC METABOLIC PANEL
BUN/Creatinine Ratio: 12 (ref 12–28)
BUN: 14 mg/dL (ref 8–27)
CO2: 25 mmol/L (ref 20–29)
Calcium: 9.2 mg/dL (ref 8.7–10.3)
Chloride: 101 mmol/L (ref 96–106)
Creatinine, Ser: 1.18 mg/dL — ABNORMAL HIGH (ref 0.57–1.00)
Glucose: 76 mg/dL (ref 65–99)
Potassium: 3.7 mmol/L (ref 3.5–5.2)
Sodium: 143 mmol/L (ref 134–144)
eGFR: 48 mL/min/{1.73_m2} — ABNORMAL LOW (ref >59)

## 2020-06-23 LAB — CBC
Hematocrit: 38.6 % (ref 34.0–46.6)
Hemoglobin: 12.4 g/dL (ref 11.1–15.9)
MCH: 26.7 pg (ref 26.6–33.0)
MCHC: 32.1 g/dL (ref 31.5–35.7)
MCV: 83 fL (ref 79–97)
Platelets: 569 10*3/uL — ABNORMAL HIGH (ref 150–450)
RBC: 4.65 x10E6/uL (ref 3.77–5.28)
RDW: 16.1 % — ABNORMAL HIGH (ref 11.7–15.4)
WBC: 12.3 10*3/uL — ABNORMAL HIGH (ref 3.4–10.8)

## 2020-06-23 MED ORDER — METOPROLOL SUCCINATE ER 100 MG PO TB24: 50.0000 mg | ORAL_TABLET | Freq: Every day | ORAL | 3 refills | Status: AC

## 2020-06-23 NOTE — Telephone Encounter (Signed)
Spoke with Dr. Andree Elk in Anesthesia and he said it would be okay for patient to have some black coffee with her meds in the morning prior to her cardioversion.   Called and spoke with patients fiance and informed him of this. I also told him that I fixed the patients Metoprolol in her chart to reflect the 50 MG daily as it was before.

## 2020-06-23 NOTE — Telephone Encounter (Signed)
Patient called in stating someone in the office was supposed to reach back out to her once they have spoken to her insurance company regarding her laser procedure.  Per telephone note back in 01/17/2020 patient requested to not move forward with laser procedure due to covid.  Patient also states that she called again, but there was no telephone note corresponded entered.  Please advise.

## 2020-06-23 NOTE — Telephone Encounter (Signed)
Patients fiance calling back stating he made a mistake in the visit yesterday. The metoprolol is actually a half of a pill instead of whole.  Patient also is wanting to know if she can drink black coffee the morning of her procedure  Please advise

## 2020-06-26 ENCOUNTER — Telehealth: Payer: Self-pay | Admitting: Cardiology

## 2020-06-26 ENCOUNTER — Other Ambulatory Visit
Admission: RE | Admit: 2020-06-26 | Discharge: 2020-06-26 | Disposition: A | Discharge status: Home / Self Care | Payer: Medicare Other | Source: Ambulatory Visit | Attending: Cardiology | Admitting: Cardiology

## 2020-06-26 ENCOUNTER — Other Ambulatory Visit: Payer: Self-pay

## 2020-06-26 DIAGNOSIS — Z01812 Encounter for preprocedural laboratory examination: Secondary | ICD-10-CM | POA: Diagnosis present

## 2020-06-26 DIAGNOSIS — Z20822 Contact with and (suspected) exposure to covid-19: Secondary | ICD-10-CM | POA: Insufficient documentation

## 2020-06-26 LAB — SARS CORONAVIRUS 2 (TAT 6-24 HRS): SARS Coronavirus 2: NEGATIVE

## 2020-06-26 NOTE — Telephone Encounter (Signed)
Please call to discuss doseage for Metoprolol

## 2020-06-27 NOTE — Telephone Encounter (Signed)
[facility] is returning your call.

## 2020-06-27 NOTE — Telephone Encounter (Signed)
Called and left another VM for [facility] to call back.

## 2020-06-27 NOTE — Telephone Encounter (Signed)
[facility] and left a VM for a call back.

## 2020-06-27 NOTE — Telephone Encounter (Signed)
Received fax requesting clarification on patient's metoprolol dosage. Unable to return fax as no fax number was included on the form. Call attempted to speak with pharmacy staff to confirm medication dosage. Left voicemail message stating patient is to be taking 50mg  of metoprolol succinate daily per Jonna Munro, RN. Advised staff to call back with any further question or concerns.

## 2020-06-28 ENCOUNTER — Encounter: Payer: Self-pay | Admitting: Anesthesiology

## 2020-06-28 ENCOUNTER — Other Ambulatory Visit: Payer: Self-pay

## 2020-06-28 ENCOUNTER — Encounter
Admission: RE | Disposition: A | Discharge status: Home / Self Care | Payer: Self-pay | Source: Home / Self Care | Attending: Cardiology

## 2020-06-28 ENCOUNTER — Ambulatory Visit
Admission: RE | Admit: 2020-06-28 | Discharge: 2020-06-28 | Disposition: A | Discharge status: Home / Self Care | Payer: Medicare Other | Attending: Cardiology | Admitting: Cardiology

## 2020-06-28 DIAGNOSIS — Z7901 Long term (current) use of anticoagulants: Secondary | ICD-10-CM | POA: Diagnosis not present

## 2020-06-28 DIAGNOSIS — Z539 Procedure and treatment not carried out, unspecified reason: Secondary | ICD-10-CM | POA: Diagnosis not present

## 2020-06-28 DIAGNOSIS — I428 Other cardiomyopathies: Secondary | ICD-10-CM | POA: Diagnosis not present

## 2020-06-28 DIAGNOSIS — Z7951 Long term (current) use of inhaled steroids: Secondary | ICD-10-CM | POA: Insufficient documentation

## 2020-06-28 DIAGNOSIS — Z79899 Other long term (current) drug therapy: Secondary | ICD-10-CM | POA: Insufficient documentation

## 2020-06-28 DIAGNOSIS — I2510 Atherosclerotic heart disease of native coronary artery without angina pectoris: Secondary | ICD-10-CM | POA: Insufficient documentation

## 2020-06-28 DIAGNOSIS — Z87891 Personal history of nicotine dependence: Secondary | ICD-10-CM | POA: Insufficient documentation

## 2020-06-28 DIAGNOSIS — I4819 Other persistent atrial fibrillation: Secondary | ICD-10-CM | POA: Diagnosis not present

## 2020-06-28 DIAGNOSIS — Z882 Allergy status to sulfonamides status: Secondary | ICD-10-CM | POA: Diagnosis not present

## 2020-06-28 HISTORY — PX: CARDIOVERSION: SHX1299

## 2020-06-28 SURGERY — CARDIOVERSION
Anesthesia: General

## 2020-06-28 NOTE — Interval H&P Note (Signed)
History and Physical Interval Note:  06/28/2020 2:39 PM  Paula Klein  has presented today for surgery, with the diagnosis of  Afib.  The various methods of treatment have been discussed with the patient and family. After consideration of risks, benefits and other options for treatment, the patient has consented to  Procedure(s): CARDIOVERSION (N/A) as a surgical intervention.   EKG obtained today showed normal sinus rhythm. As such, cardioversion was not performed. Patient will continue amiodarone 200mg  bid x 1 week, then decrease dose to 200mg  daily.   Keep already follow up appointment with myself as outpatient.   Aaron Edelman Agbor-Etang

## 2020-06-29 ENCOUNTER — Encounter: Payer: Self-pay | Admitting: Cardiology

## 2020-06-29 ENCOUNTER — Other Ambulatory Visit: Payer: Self-pay

## 2020-06-29 MED ORDER — ATORVASTATIN CALCIUM 40 MG PO TABS: 40.0000 mg | ORAL_TABLET | Freq: Every day | ORAL | 0 refills | Status: AC

## 2020-07-01 ENCOUNTER — Emergency Department (HOSPITAL_COMMUNITY)
Admission: EM | Admit: 2020-07-01 | Discharge: 2020-08-02 | Disposition: E | Payer: Medicare Other | Attending: Emergency Medicine | Admitting: Emergency Medicine

## 2020-07-01 DIAGNOSIS — I469 Cardiac arrest, cause unspecified: Secondary | ICD-10-CM | POA: Diagnosis not present

## 2020-07-01 DIAGNOSIS — Z8616 Personal history of COVID-19: Secondary | ICD-10-CM | POA: Insufficient documentation

## 2020-07-01 DIAGNOSIS — I110 Hypertensive heart disease with heart failure: Secondary | ICD-10-CM | POA: Diagnosis not present

## 2020-07-01 DIAGNOSIS — Z87891 Personal history of nicotine dependence: Secondary | ICD-10-CM | POA: Insufficient documentation

## 2020-07-01 DIAGNOSIS — W1839XA Other fall on same level, initial encounter: Secondary | ICD-10-CM | POA: Insufficient documentation

## 2020-07-01 DIAGNOSIS — I509 Heart failure, unspecified: Secondary | ICD-10-CM | POA: Insufficient documentation

## 2020-07-01 NOTE — ED Notes (Signed)
June 17, 2324 - arrival to ED via [place]. CPR in progress. Dr. [name] at bedside and CPR paused per provider order. Aystole on monitor.  June 17, 2325 - CPR resumed 2326-06-18 - Compressions paused. Asystole on monitor. No signs of circulations via bedside ultrasound.  06/18/27 - Time of death called by Dr. [name].

## 2020-07-01 NOTE — ED Provider Notes (Signed)
[facility] Provider Note MRN:  [id number]  Arrival date & time: 07/21/2020     Chief Complaint   Cardiac Arrest   History of Present Illness   Paula Klein is a 75 y.o. year-old female with a history of CHF presenting to the ED with chief complaint of cardiac arrest.  Witnessed cardiac arrest at home.  Family describes shaking behavior and then patient fell to the ground.  Had urinated on herself.  Patient had been complaining of burning chest pain for the past few days.   I was unable to obtain an accurate HPI, PMH, or ROS due to the patient's cardiac arrest.  Level 5 caveat.    Review of Systems  Positive for cardiac arrest.  Patient's Health History    Past Medical History:  Diagnosis Date  . CHF (congestive heart failure) ([place])   . COVID-19   . H/O blood clots   . Heart murmur   . Hypertension     Past Surgical History:  Procedure Laterality Date  . CARDIOVERSION N/A 05/24/2020   Procedure: CARDIOVERSION;  Surgeon: Kate Sable, MD;  Location: [facility] ORS;  Service: Cardiovascular;  Laterality: N/A;  . CARDIOVERSION N/A 06/28/2020   Procedure: CARDIOVERSION;  Surgeon: Kate Sable, MD;  Location: [facility] ORS;  Service: Cardiovascular;  Laterality: N/A;  . COLONOSCOPY WITH PROPOFOL N/A 09/26/2015   Procedure: COLONOSCOPY WITH PROPOFOL;  Surgeon: Lollie Sails, MD;  Location: [facility] ENDOSCOPY;  Service: Endoscopy;  Laterality: N/A;  . NO PAST SURGERIES    . RIGHT/LEFT HEART CATH AND CORONARY ANGIOGRAPHY N/A 03/24/2020   Procedure: RIGHT/LEFT HEART CATH AND CORONARY ANGIOGRAPHY;  Surgeon: Wellington Hampshire, MD;  Location: [facility] CV LAB;  Service: Cardiovascular;  Laterality: N/A;    Family History  Problem Relation Age of Onset  . Varicose Veins Mother   . AAA (abdominal aortic aneurysm) Mother   . Heart attack Father   . Cancer Brother     Social History   Socioeconomic History  . Marital status: Widowed     Spouse name: Not on file  . Number of children: Not on file  . Years of education: Not on file  . Highest education level: Not on file  Occupational History  . Not on file  Tobacco Use  . Smoking status: Former Smoker    Packs/day: 3.00    Years: 30.00    Pack years: 90.00    Quit date: 02/03/1989    Years since quitting: 31.4  . Smokeless tobacco: Never Used  Vaping Use  . Vaping Use: Never used  Substance and Sexual Activity  . Alcohol use: Yes    Comment: rarely  . Drug use: No  . Sexual activity: Not on file  Other Topics Concern  . Not on file  Social History Narrative  . Not on file   Social Determinants of Health   Financial Resource Strain: Not on file  Food Insecurity: Not on file  Transportation Needs: Not on file  Physical Activity: Not on file  Stress: Not on file  Social Connections: Not on file  Intimate Partner Violence: Not on file     Physical Exam  There were no vitals filed for this visit.  CONSTITUTIONAL: Ill-appearing NEURO: Unresponsive EYES: Pupils fixed and dilated ENT/NECK:  no LAD, no JVD, King airway in place CARDIO: Poorly perfused PULM:  CTAB no wheezing or rhonchi GI/GU: Nondistended MSK/SPINE:  No gross deformities, no edema SKIN:  no rash, atraumatic PSYCH:  Unable to assess  *Additional and/or pertinent findings included in MDM below  Diagnostic and Interventional Summary    EKG Interpretation  Date/Time:    Ventricular Rate:    PR Interval:    QRS Duration:   QT Interval:    QTC Calculation:   R Axis:     Text Interpretation:        Labs Reviewed - No data to display  No orders to display    Medications - No data to display   Procedures  /  Critical Care .Critical Care Performed by: Maudie Flakes, MD Authorized by: Maudie Flakes, MD   Critical care provider statement:    Critical care time (minutes):  37   Critical care was necessary to treat or prevent imminent or life-threatening deterioration of  the following conditions: cardiac arrest.   Critical care was time spent personally by me on the following activities:  Discussions with consultants, evaluation of patient's response to treatment, examination of patient, ordering and performing treatments and interventions, ordering and review of laboratory studies, ordering and review of radiographic studies, pulse oximetry, re-evaluation of patient's condition, obtaining history from patient or surrogate and review of old charts Ultrasound ED Echo  Date/Time: Jul 22, 2020 11:38 PM Performed by: Maudie Flakes, MD Authorized by: Maudie Flakes, MD   Procedure details:    Indications: cardiac arrest     Views: apical 4 chamber view     Images: not archived   Findings:    Pericardium: no pericardial effusion     Cardiac Activity: no cardiac activity      ED Course and Medical Decision Making  I have reviewed the triage vital signs, the nursing notes, and pertinent available records from the EMR.  Listed above are laboratory and imaging tests that I personally ordered, reviewed, and interpreted and then considered in my medical decision making (see below for details).  1 shot given in the field, advised by AED.  Unknown initial rhythm.  Total of 9 epi's provided.  PEA arrest for the majority of this downtime, over 40 minutes of total CPR time.  In the field she was pulseless but fire department personnel thought they heard heart sounds with stethoscope and so decision was made to bring him in for evaluation.  On my assessment we pause the Paula Klein device and she is pulseless.  Quick ultrasound reveals complete cardiac standstill.  Pupils fixed and dilated.  Efforts at this point seem futile.  Given 2 more minutes on the [place], patient now in asystole.  No need for further epinephrine given the large dose already provided.  After 2 minutes ultrasound was repeated, still with cardiac standstill.  Time of death called at 11:29 PM.   Paula Klein. Paula Klein,  Paula Klein [email or web address]  Final Clinical Impressions(s) / ED Diagnoses     ICD-10-CM   1. Cardiac arrest [facility])  I46.9     ED Discharge Orders    None       Discharge Instructions Discussed with and Provided to Patient:   Discharge Instructions   None    She was pulseless   Maudie Flakes, MD Jul 22, 2020 2344

## 2020-07-01 NOTE — ED Triage Notes (Signed)
BIB EMS following witnessed cardiac arrest. 9 epi, 1 shock given PTA

## 2020-07-02 DEATH — deceased

## 2020-07-17 ENCOUNTER — Ambulatory Visit: Payer: Medicare Other | Admitting: Cardiology

## 2020-08-02 NOTE — ED Notes (Signed)
[place] notified per protocol.

## 2020-08-02 NOTE — ED Notes (Signed)
Pt belongings consisting of three necklaces were placed into a denture cup and secured closed with patient sticker, same was placed into al bio lab bag and secured closed once again with patient sticker, this bag was placed into body bag with patient. Security called at this time to transfer patient.

## 2020-08-02 DEATH — deceased

## 2020-08-18 ENCOUNTER — Ambulatory Visit: Payer: Medicare Other | Admitting: Cardiology

## 2020-08-31 ENCOUNTER — Encounter (HOSPITAL_COMMUNITY): Payer: Medicare Other | Admitting: Internal Medicine

## 2022-05-01 IMAGING — US US ABDOMEN LIMITED
1 series · 14 of 25 positions shown · non-contrast
Comparison: None.

CLINICAL DATA: RIGHT upper quadrant pain.

EXAM:
ULTRASOUND ABDOMEN LIMITED RIGHT UPPER QUADRANT

[Series 1: us abdomen limited ruq (liver/gb) · 14 of 44 slices shown]
[im 1/44]
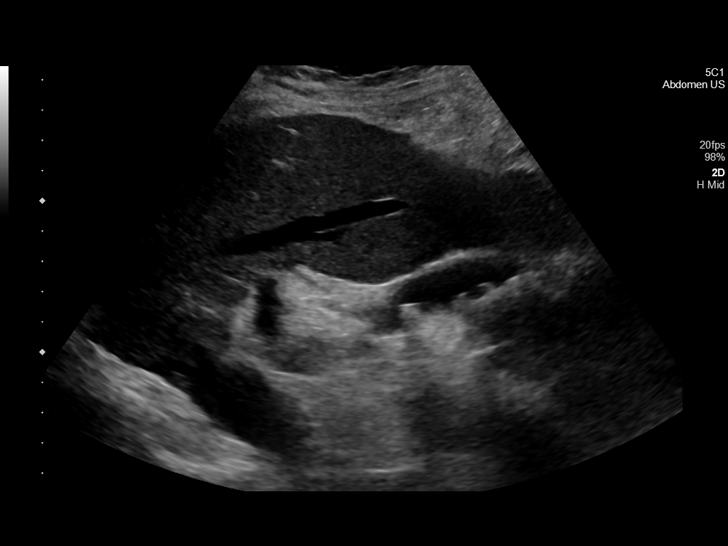
[im 4/44]
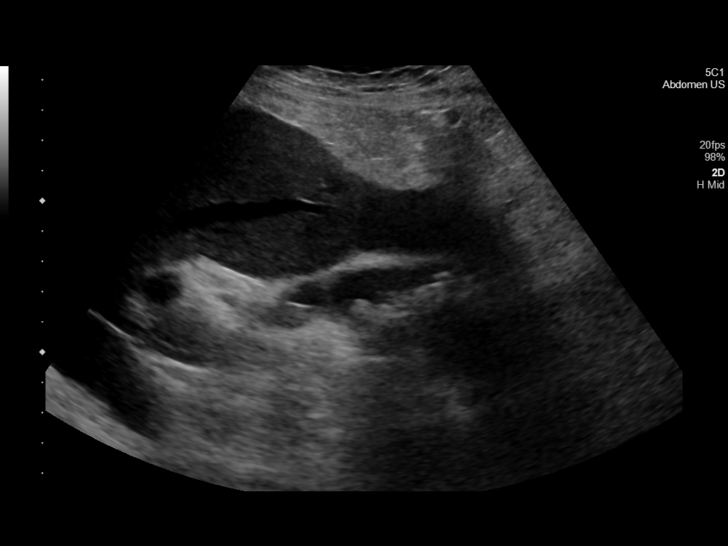
[im 8/44]
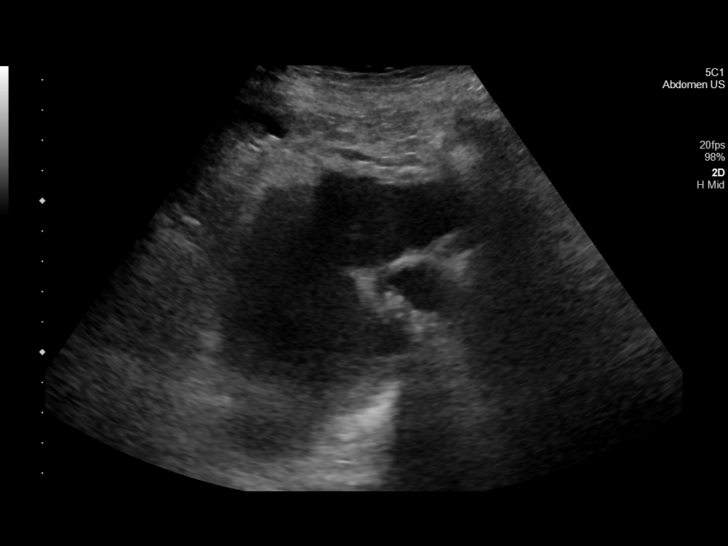
[im 11/44]
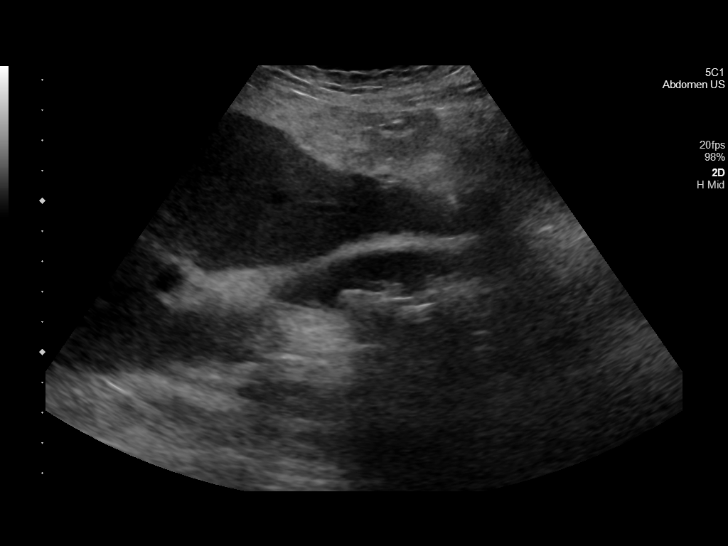
[im 15/44]
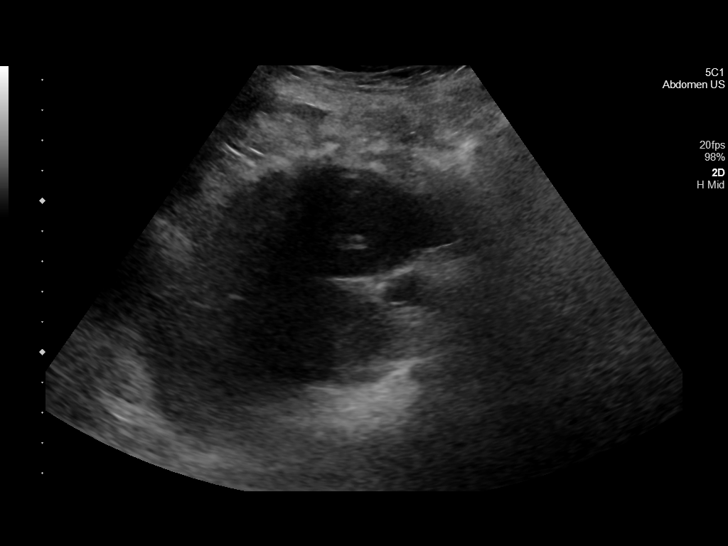
[im 17/44]
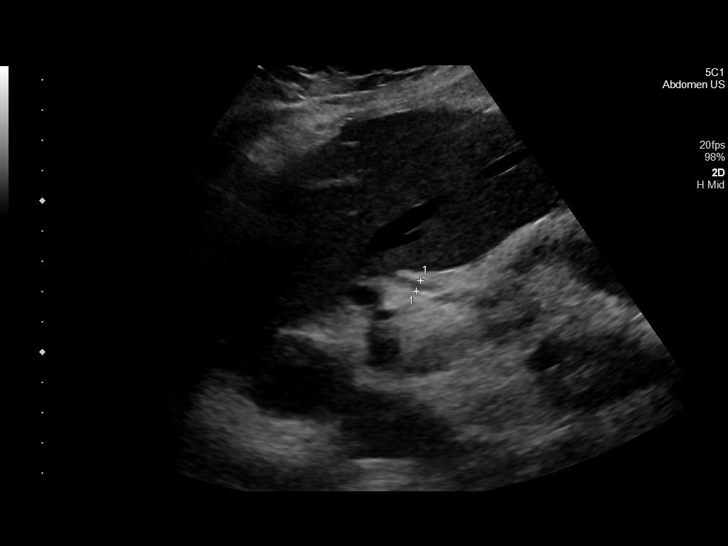
[im 20/44]
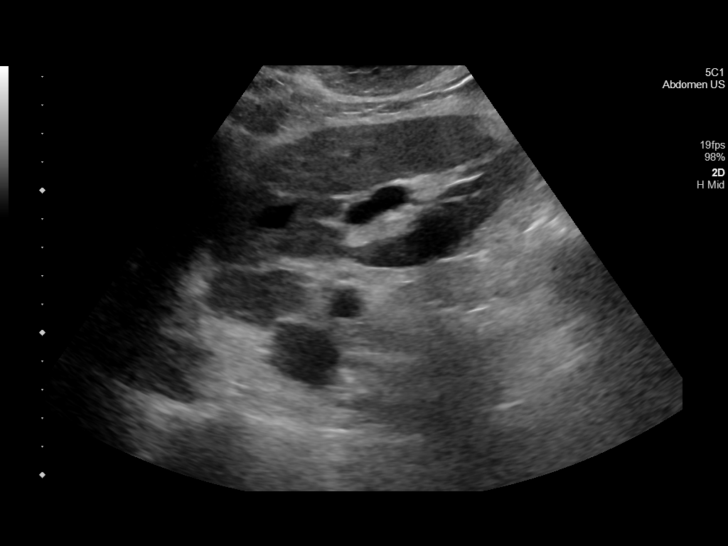
[im 24/44]
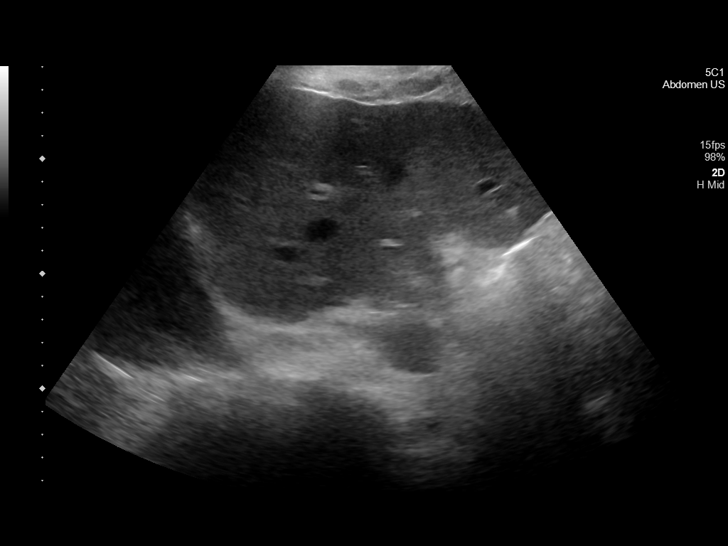
[im 27/44]
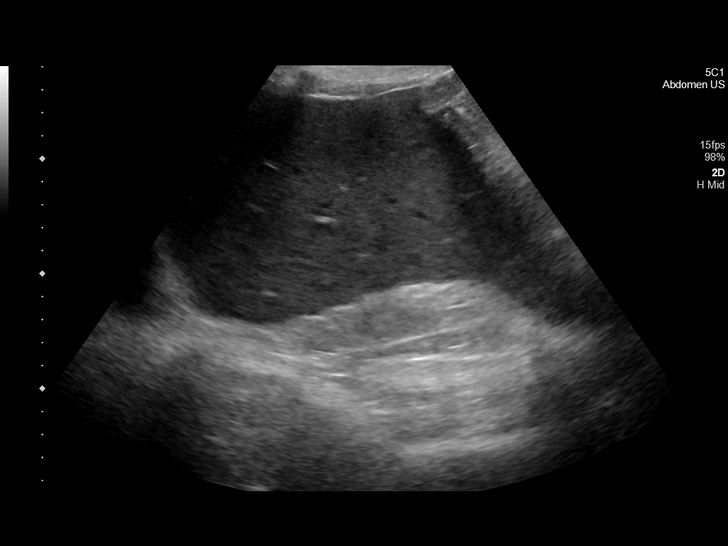
[im 29/44]
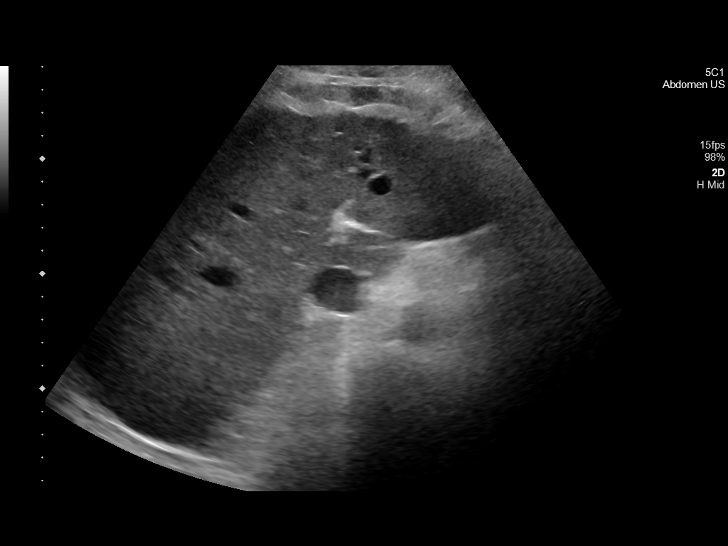
[im 33/44]
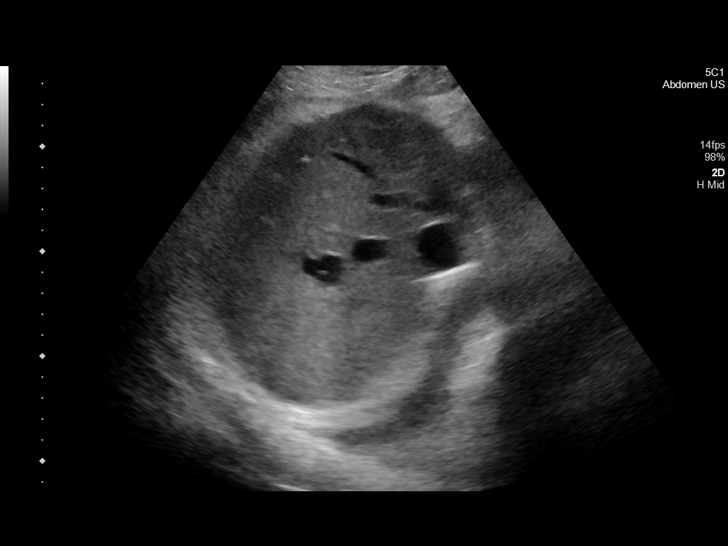
[im 36/44]
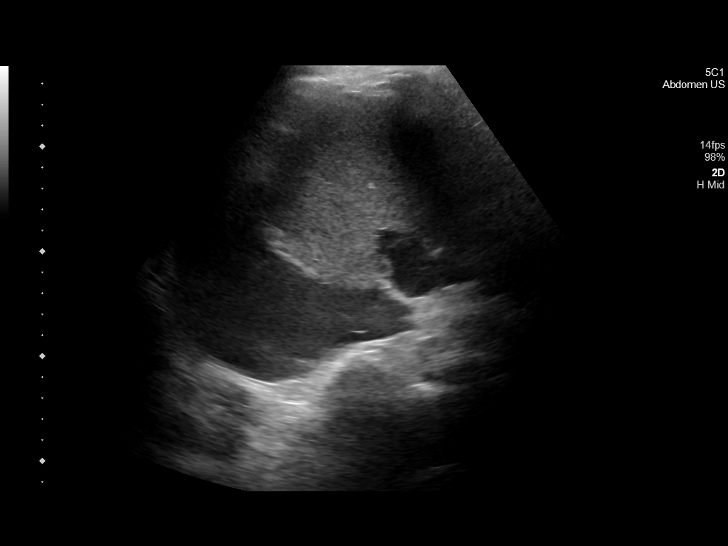
[im 40/44]
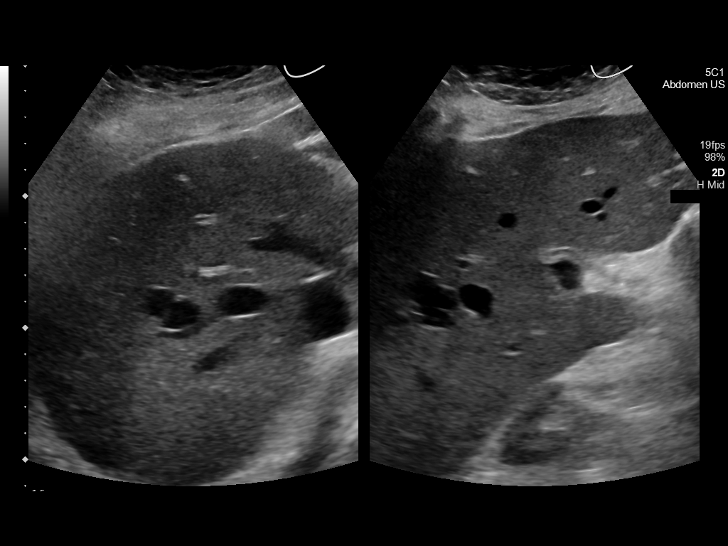
[im 44/44]
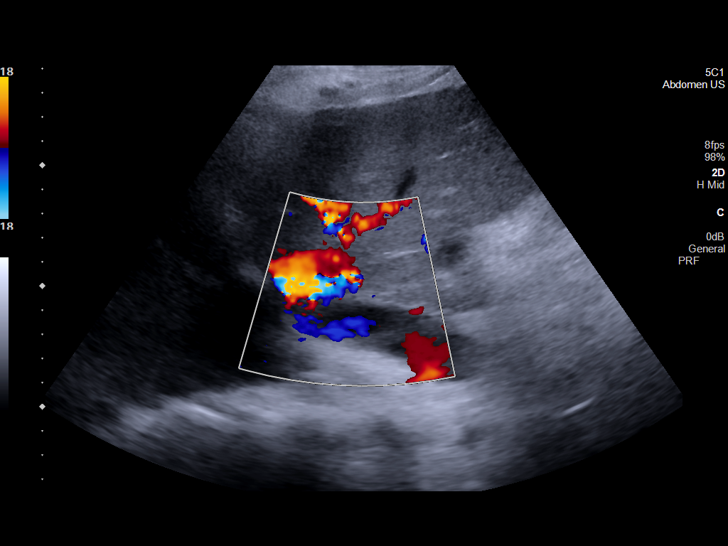

[14 of 25 positions shown; findings below may reference images not displayed]

FINDINGS: Gallbladder:

Multiple shadowing gallstones within lumen the gallbladder measuring
size from 5-10 mm. Positive sonographic Murphy's sign. No
pericholecystic fluid.

Common bile duct:

Diameter: Normal at 4 mm

Liver:

Mildly complex cyst in the RIGHT hepatic lobe corresponds to
comparison CT. Liver parenchyma within normal limits in parenchymal
echogenicity. Portal vein is patent on color Doppler imaging with
normal direction of blood flow towards the liver.

Other: Trace ascites.  RIGHT pleural effusion.
IMPRESSION: 1. Multiple gallstones and positive sonographic Murphy's sign.
Evaluate for acute cholecystitis.
2. Normal common bile duct.
3. RIGHT effusion and trace ascites.

## 2022-05-03 IMAGING — MR MR MRCP
9 of 12 series · 40 of 48 positions shown · non-contrast
Comparison: Multiple exams, including 02/28/2020 CT and 02/26/2020
ultrasound

CLINICAL DATA: Gallstones with sonographic Murphy's sign. New
splenic hypodense lesion on CT.

EXAM:
MRI ABDOMEN WITHOUT CONTRAST  (INCLUDING MRCP)
TECHNIQUE: Multiplanar multisequence MR imaging of the abdomen was performed.
Heavily T2-weighted images of the biliary and pancreatic ducts were
obtained, and three-dimensional MRCP images were rendered by post
processing.

[Series 2: T2 · coronal · 6.0mm · 1.25mm/px · 4 of 30 slices shown (1 of 2)]
[im 1/30]
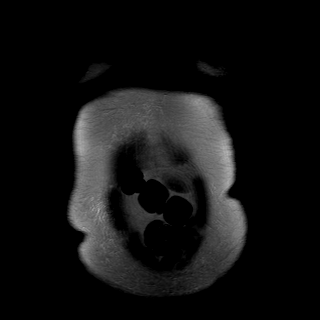
[im 10/30]
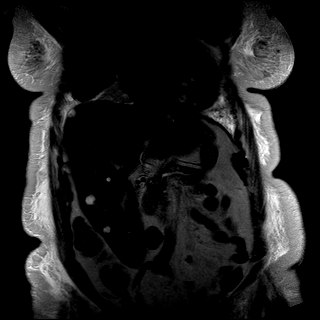
[im 20/30]
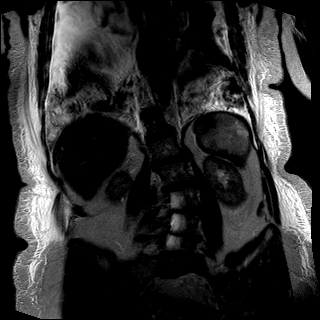
[im 30/30]
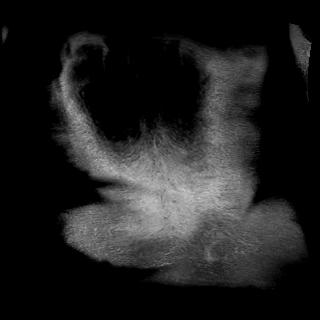

[Series 3: T2 · axial · 6.0mm · 1.19mm/px · z∈[-168,+84]mm · 3 of 36 slices shown (2 of 2)]
[im 1/36]
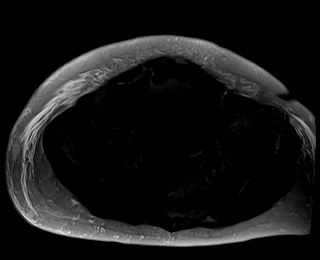
[im 18/36]
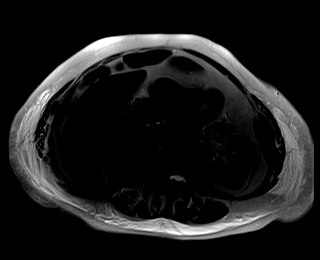
[im 36/36]
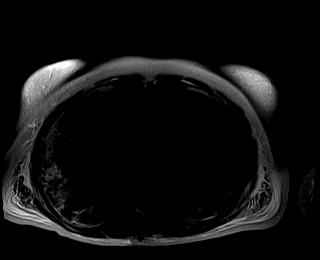

[Series 4: ax dwi_tracew · axial · 6.0mm · 1.42mm/px · z∈[-168,+84]mm · 9 of 108 slices shown]
[im 1/108]
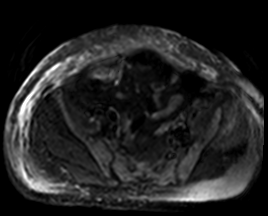
[im 14/108]
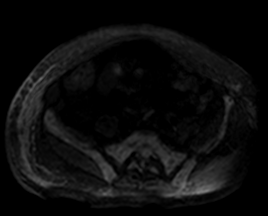
[im 27/108]
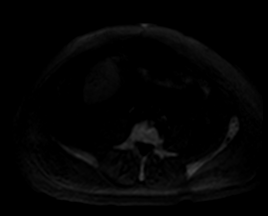
[im 41/108]
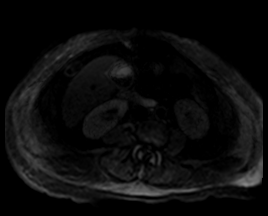
[im 54/108]
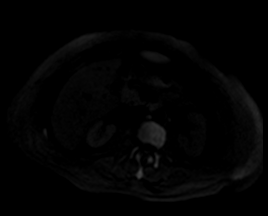
[im 67/108]
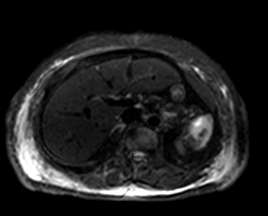
[im 81/108]
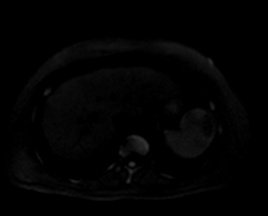
[im 94/108]
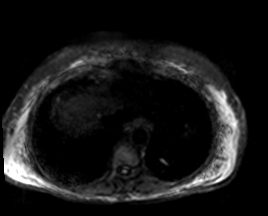
[im 108/108]
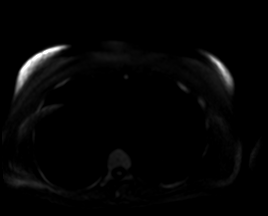

[Series 5: ax dwi_adc · axial · 6.0mm · 1.42mm/px · z∈[-168,+84]mm · 3 of 36 slices shown]
[im 1/36]
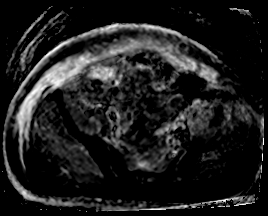
[im 18/36]
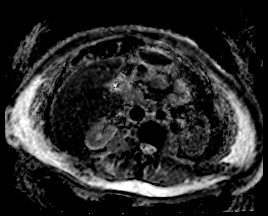
[im 36/36]
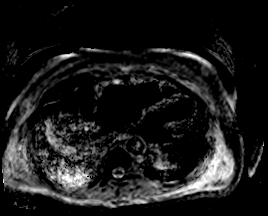

[Series 10: T2 fat-sat · axial · 6.0mm · 1.19mm/px · z∈[-168,+84]mm · 3 of 36 slices shown]
[im 1/36]
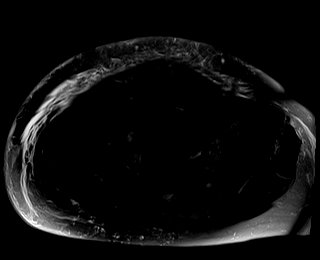
[im 18/36]
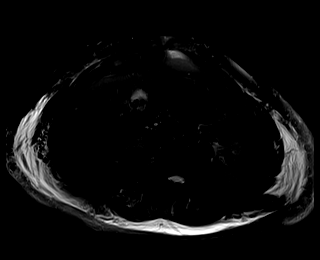
[im 36/36]
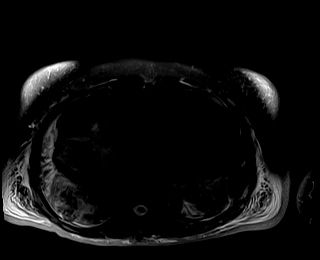

[Series 11: T1 · axial · 6.0mm · 0.74mm/px · z∈[-168,+84]mm · 6 of 72 slices shown]
[im 1/72]
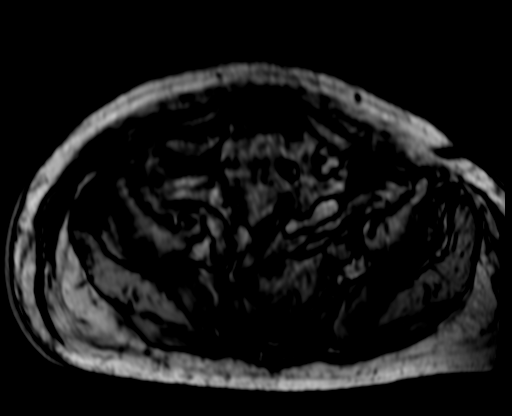
[im 15/72]
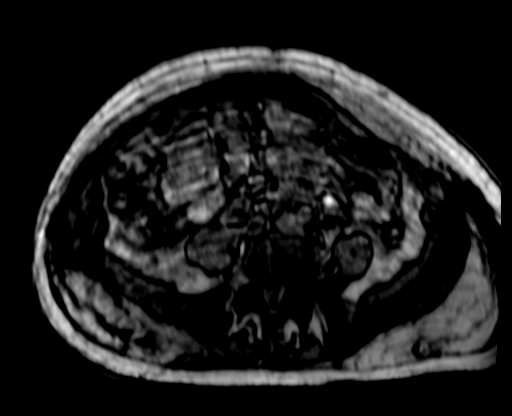
[im 29/72]
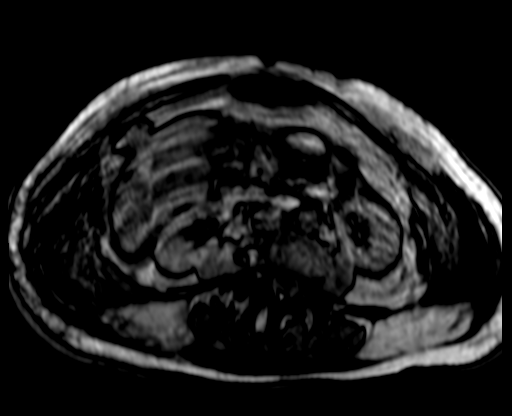
[im 43/72]
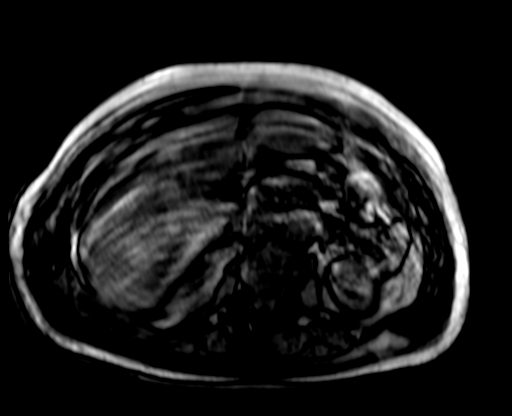
[im 57/72]
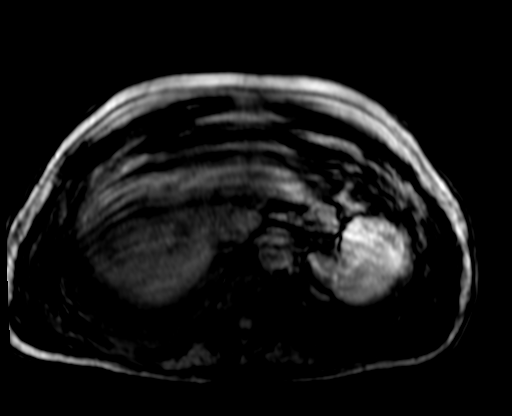
[im 72/72]
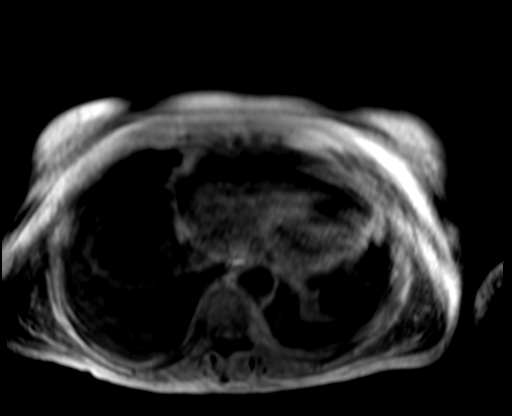

[Series 12: bSSFP · axial · 6.0mm · 0.74mm/px · z∈[-168,+84]mm · 3 of 36 slices shown]
[im 1/36]
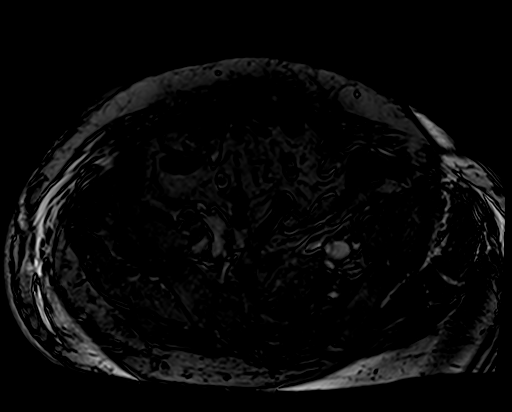
[im 18/36]
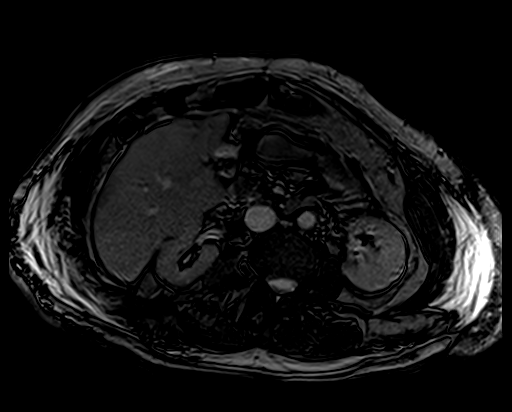
[im 36/36]
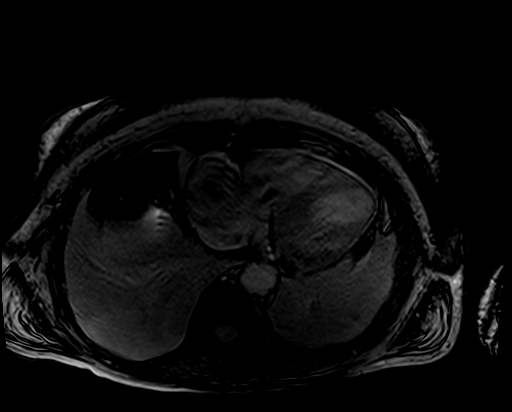

[Series 13: T1 dynamic fat-sat · axial · non-contrast · 3.0mm · 1.19mm/px · z∈[-172,+89]mm · 8 of 88 slices shown]
[im 1/88]
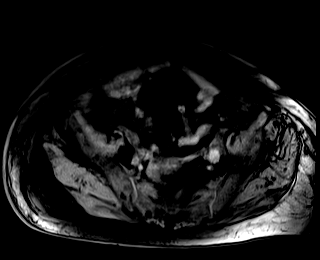
[im 13/88]
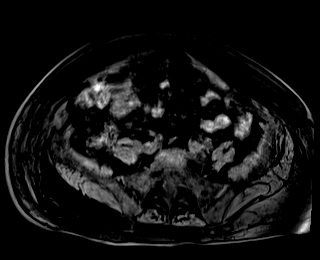
[im 25/88]
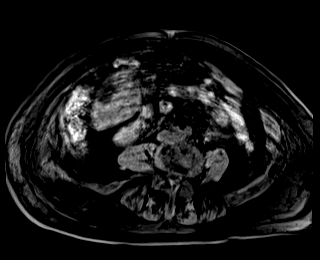
[im 38/88]
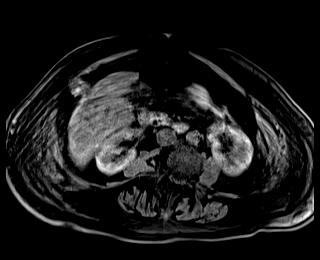
[im 50/88]
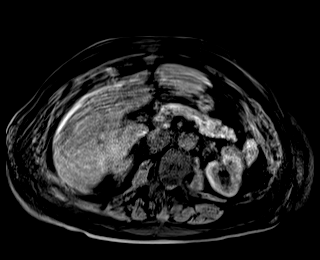
[im 63/88]
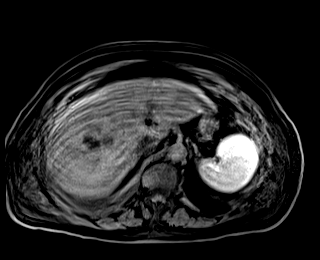
[im 75/88]
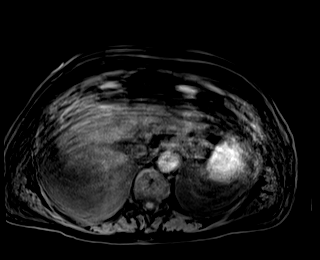
[im 88/88]
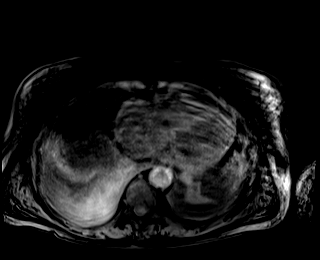

[Series 14: MRCP · coronal · 3.0mm · 1.12mm/px · 1 of 17 slices shown]
[im 1/17]
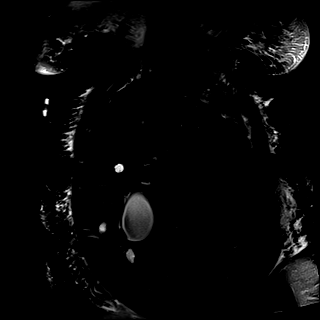

[40 of 48 positions shown; findings below may reference images not displayed]

FINDINGS: Lower chest: Substantial bilateral pleural effusions with passive
atelectasis. Mild cardiomegaly.

Hepatobiliary: Multiple sharply defined T2 hyperintense hepatic
lesions favoring cysts. Some such the 3.3 by 2.2 cm cystic lesion
inferiorly in the right hepatic lobe on image 27 of series [DATE]
have associated faint internal septation. No obvious internal
nodularity.

Multiple gallstones measuring up to about 0.8 cm in diameter are
subtle dependently in the gallbladder. Common bile duct up to 0.6 cm
in diameter (upper normal for age) with slightly abrupt distal
truncation rather than conical tapering at the tip, but without a
well-defined low signal intensity within the CBD to further favor
choledocholithiasis. No intrahepatic biliary dilatation.

Pancreas:  Unremarkable

Spleen: 4.1 by 2.3 cm peripheral primarily fluid lesion with high T2
and intermediate to high T1 signal intensity peripherally in the
spleen, with a low signal intensity somewhat irregular rim on
diffusion-weighted images. Irregular similar signal in the anterior
inferior spleen in a bandlike configuration. Medially in the spleen
there is a 2.3 by 2.1 cm region of reduced T1 and reduced T2 signal
representing another splenic lesion, with a similar appearance
inferiorly.

Adrenals/Urinary Tract:  Unremarkable

Stomach/Bowel: Unremarkable

Vascular/Lymphatic:  Aortoiliac atherosclerotic vascular disease.

Other: Bilateral flank edema in the subcutaneous tissues. Low-grade
retroperitoneal edema.

Musculoskeletal: Levoconvex lumbar scoliosis with spondylosis and
degenerative disc disease. Nonspecific 8 mm T2 hyperintense lesion
eccentric to the left in the L4 vertebral body.
IMPRESSION: 1. Cholelithiasis. No biliary dilatation or definite
choledocholithiasis. There is slightly abrupt distal truncation of
the common bile duct rather than conical tapering at the tip of the
CBD, but without a well-defined low signal intensity CBD to further
indicate choledocholithiasis.
2. Multiple hepatic cysts, some with mild septation. Strictly
speaking, cystadenoma is not excluded.
3. Complex splenic lesions with high T2 and intermediate to high T1
signal intensity, and are new from 01/03/2020. Top differential
diagnostic considerations include splenic hematomas (potentially in
the setting of prior splenic infarcts) versus less likely splenic
abscesses. No gas is identified in these lesions. Today's exam was
not performed with IV contrast, which can reduced specificity.
4. Substantial bilateral pleural effusions with passive atelectasis.
5. Mild cardiomegaly.
6. Nonspecific 8 mm T2 hyperintense lesion eccentric to the left in
the L4 vertebral body. This could be an atypical hemangioma but is
nonspecific. If the patient has a history of malignancy with
predilection for bony spread such as breast or lung cancer, nuclear
medicine bone scan may be helpful for further characterization.

## 2022-05-04 IMAGING — US US HEPATIC LIVER DOPPLER
1 series · 13 of 25 positions shown · non-contrast
Comparison: Right upper quadrant abdominal ultrasound-01/03/2020;
02/26/2020; CT abdomen pelvis-02/28/2020; MRCP-02/28/2020

CLINICAL DATA: Evaluate hepatic vasculature. Evaluate for
cholecystitis.

EXAM:
1. ULTRASOUND ABDOMEN LIMITED RIGHT UPPER QUADRANT
2. DUPLEX ULTRASOUND OF LIVER
TECHNIQUE: Standard right upper quadrant abdominal ultrasound was performed
followed by color and duplex Doppler ultrasound to evaluate the
hepatic in-flow and out-flow vessels.

[Series 1: us liver doppler · 52 acquisitions, 13 frames shown]
[im 1/52]
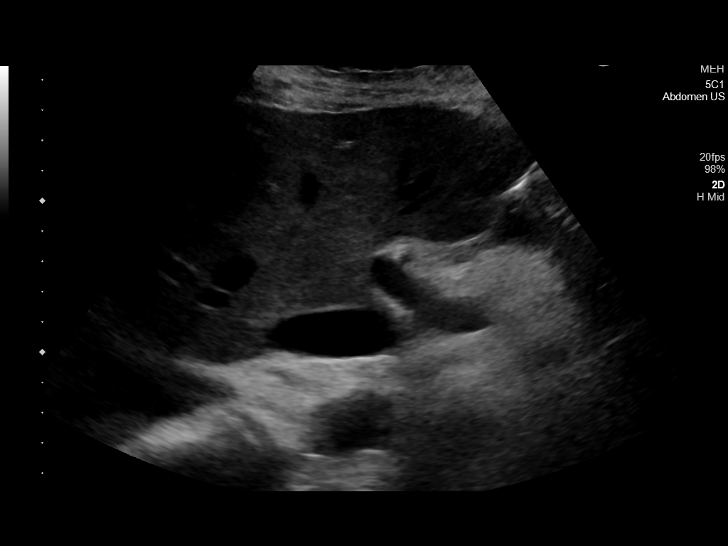
[im 5/52]
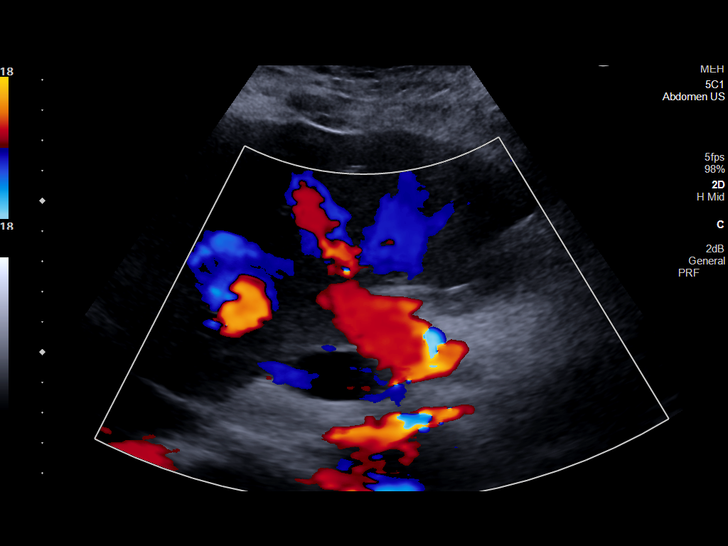
[im 9/52]
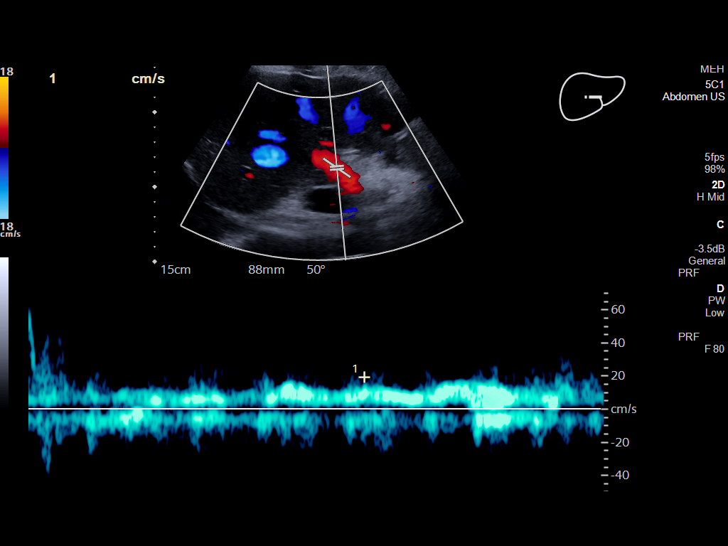
[im 13/52]
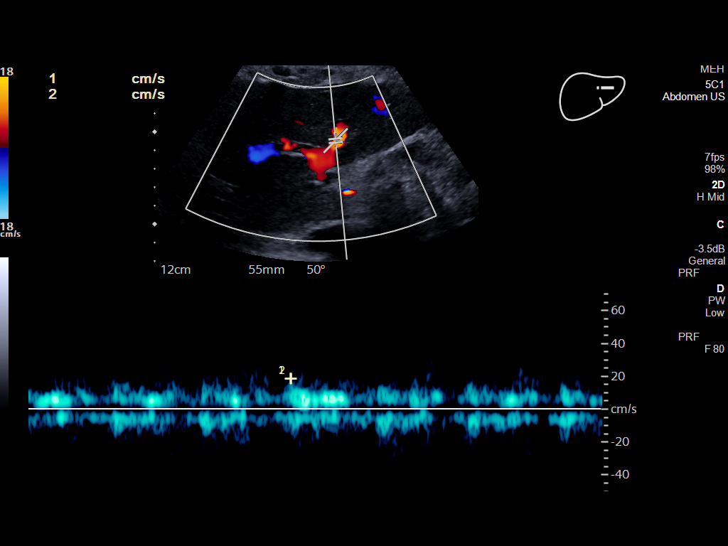
[im 18/52]
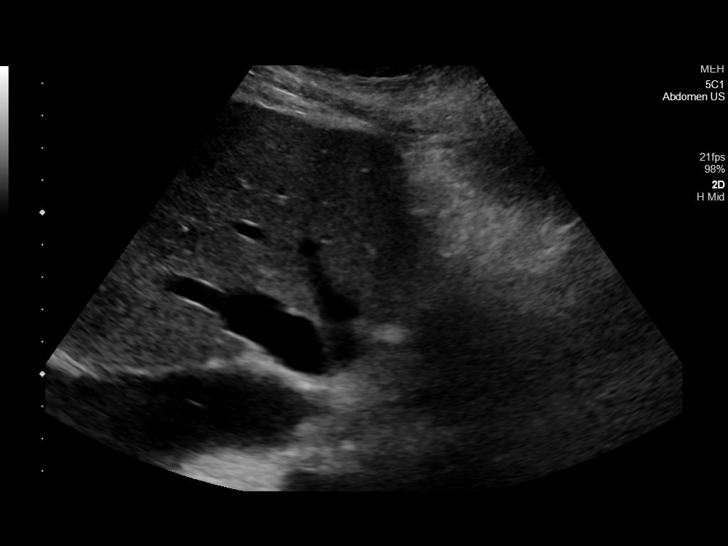
[im 22/52]
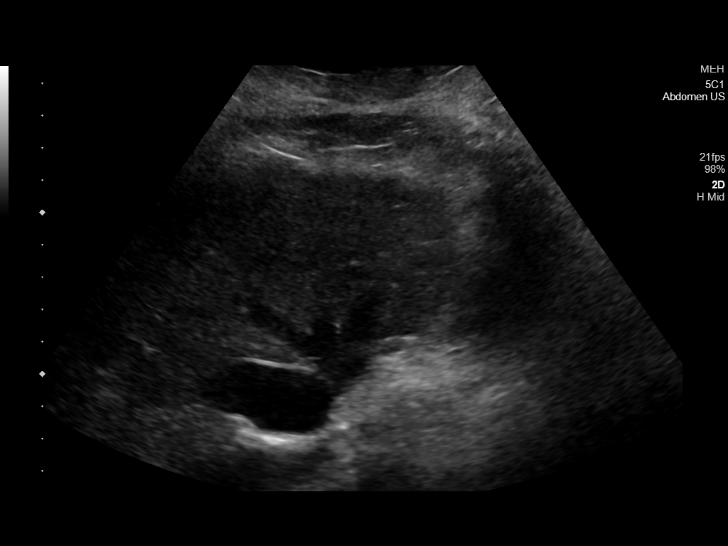
[im 26/52]
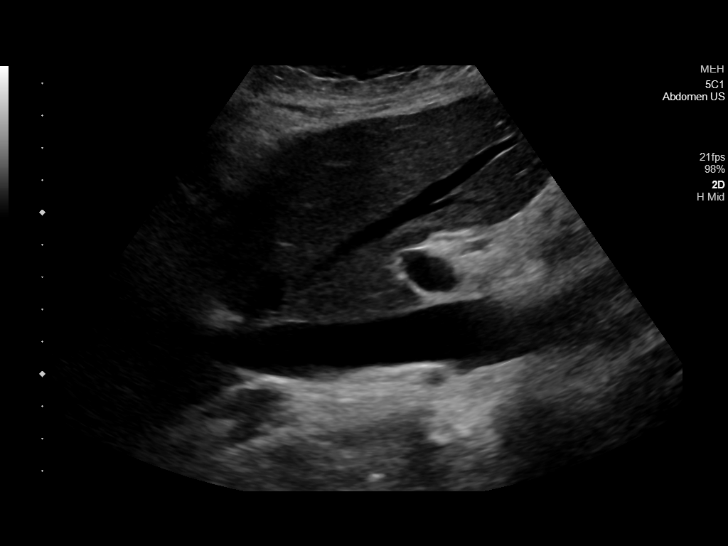
[im 30/52]
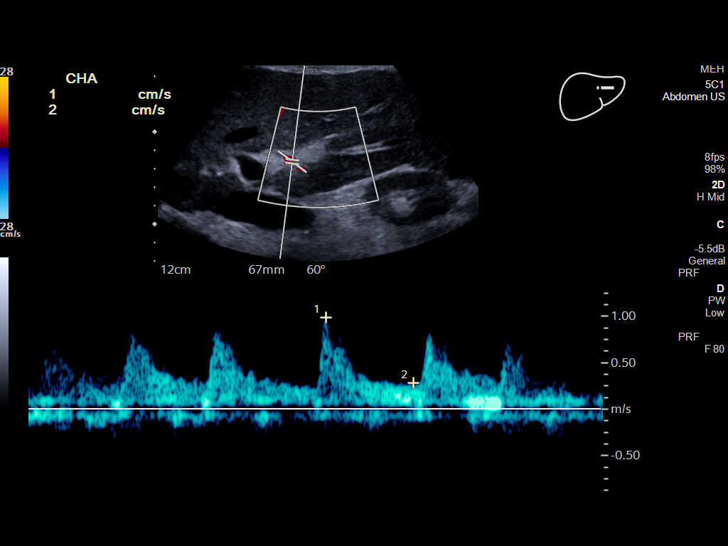
[im 35/52]
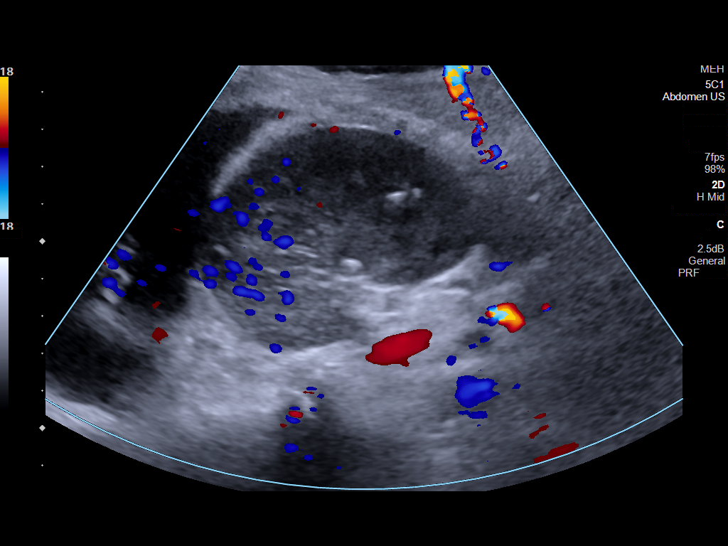
[im 39/52]
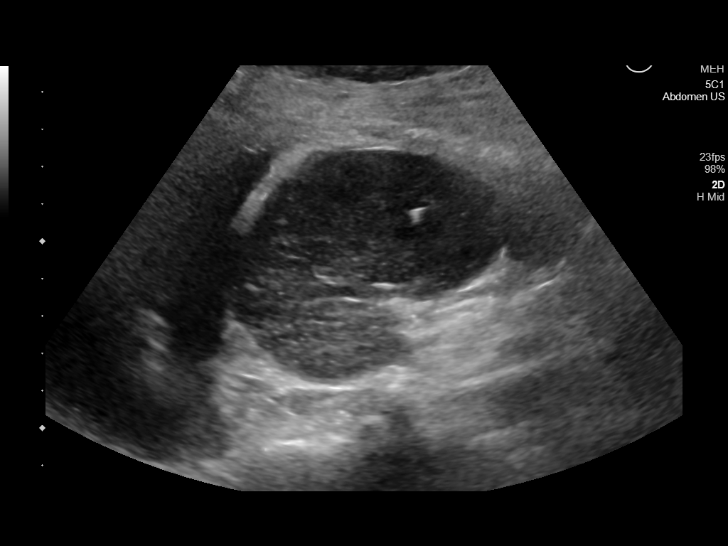
[im 43/52]
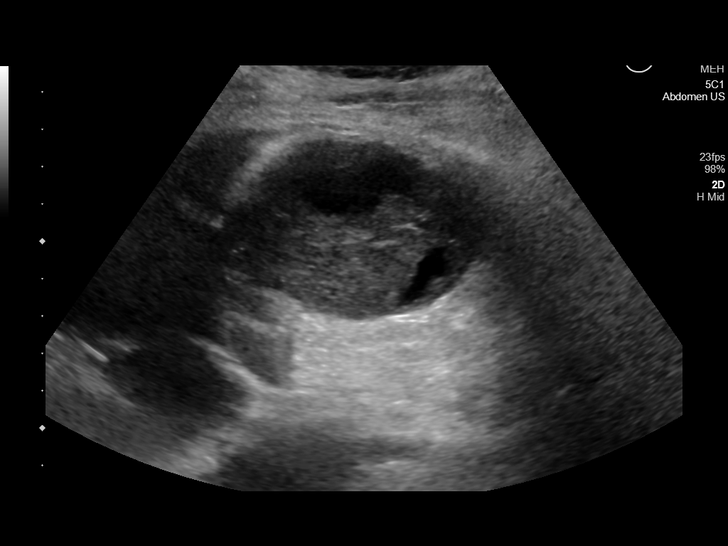
[im 47/52]
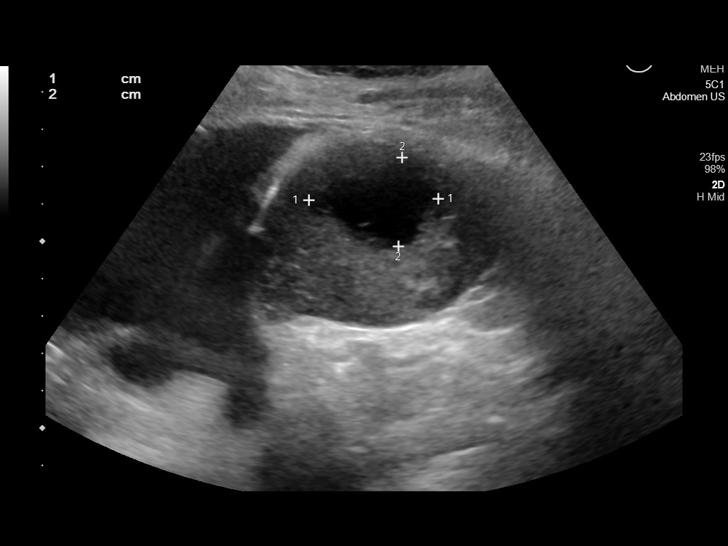
[im 52/52]
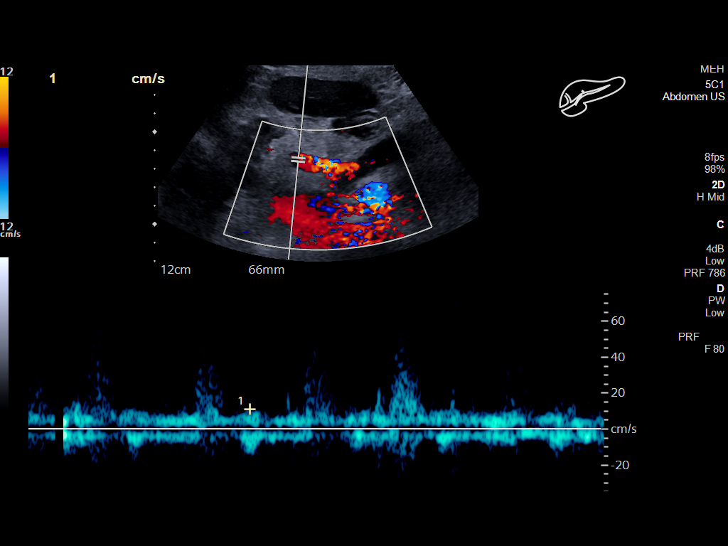

[13 of 25 positions shown; findings below may reference images not displayed]

FINDINGS: Gallbladder:

Redemonstrated multiple echogenic layering gallstones within
otherwise normal-appearing gallbladder. Index gallstone measures
approximately 1.1 cm in greatest short axis diameter. No gallbladder
wall thickening or pericholecystic stranding. Evaluation for
sonographic Murphy sign is degraded secondary to patient's mental
status.

Common bile duct:

Diameter: Normal in size measuring 2.4 mm in diameter

Liver:

Mild nodularity of the hepatic contour. Note is made of 2 punctate
(approximately 1.0 cm) anechoic cysts within the left lobe of the
liver. Note is made of two partially septated, minimally complex
cyst within the right lobe of the liver with dominant cyst measuring
approximately 3.4 x 3.1 x 2.8 cm, better demonstrated on preceding
MRCP.

Other: Note is made of a moderate-sized right-sided pleural
effusion.

_________________________________________________________

Main Portal Vein size: 1.0 cm cm

Portal Vein Velocities (normal hepatopetal directional flow)

Main Prox:  33 cm/sec

Main Mid: 18 cm/sec

Main Dist:  19 cm/sec
Right: 19 cm/sec
Left: 19 cm/sec

Hepatic Vein Velocities (normal hepatofugal directional flow)

Right:  28 cm/sec

Middle:  28 cm/sec

Left:  49 cm/sec

IVC: Present and patent with normal respiratory phasicity.

Hepatic Artery Velocity: 99 cm/sec

Splenic Vein Velocity: 40 cm/sec

Spleen: 6.9 cm x 3.9 cm x 5.0 cm with a total volume of 67 cm^3
(411 cm^3 is upper limit normal)

Portal Vein Occlusion/Thrombus: No

Splenic Vein Occlusion/Thrombus: No

Ascites: None

Varices: None
IMPRESSION: 1. Patent hepatic vasculature with normal directional flow.
2. Cholelithiasis without sonographic evidence of acute
cholecystitis though evaluation for sonographic Murphy sign degraded
secondary to altered mental status. If there remains clinical
concern for acute cholecystitis, further evaluation nuclear medicine
HIDA scan could be performed as indicated.
3. Scattered hepatic cysts, several of which appear minimally
complex/partially septated, better evaluated on preceding MRCP.
4. Moderate sized right-sided pleural effusion as demonstrated on
preceding abdominal CT.
